# Patient Record
Sex: Female | Born: 1935 | Race: White | Hispanic: No | Marital: Married | Smoking: Former smoker
Health system: Southern US, Community
[De-identification: ages and names within clinical notes are randomized; demographics above are authoritative.]

## PROBLEM LIST (undated history)

## (undated) ENCOUNTER — Emergency Department (HOSPITAL_COMMUNITY): Disposition: A | Discharge status: Home / Self Care | Payer: Medicare Other

## (undated) DIAGNOSIS — I10 Essential (primary) hypertension: Secondary | ICD-10-CM

## (undated) DIAGNOSIS — K625 Hemorrhage of anus and rectum: Secondary | ICD-10-CM

## (undated) DIAGNOSIS — Z90710 Acquired absence of both cervix and uterus: Secondary | ICD-10-CM

## (undated) DIAGNOSIS — K219 Gastro-esophageal reflux disease without esophagitis: Secondary | ICD-10-CM

## (undated) DIAGNOSIS — I4891 Unspecified atrial fibrillation: Secondary | ICD-10-CM

## (undated) DIAGNOSIS — E0590 Thyrotoxicosis, unspecified without thyrotoxic crisis or storm: Secondary | ICD-10-CM

## (undated) DIAGNOSIS — I509 Heart failure, unspecified: Secondary | ICD-10-CM

## (undated) DIAGNOSIS — N159 Renal tubulo-interstitial disease, unspecified: Secondary | ICD-10-CM

## (undated) DIAGNOSIS — E785 Hyperlipidemia, unspecified: Secondary | ICD-10-CM

## (undated) DIAGNOSIS — F419 Anxiety disorder, unspecified: Secondary | ICD-10-CM

## (undated) DIAGNOSIS — Z90722 Acquired absence of ovaries, bilateral: Secondary | ICD-10-CM

## (undated) DIAGNOSIS — I499 Cardiac arrhythmia, unspecified: Secondary | ICD-10-CM

## (undated) DIAGNOSIS — T7840XA Allergy, unspecified, initial encounter: Secondary | ICD-10-CM

## (undated) DIAGNOSIS — Z9079 Acquired absence of other genital organ(s): Secondary | ICD-10-CM

## (undated) DIAGNOSIS — D649 Anemia, unspecified: Secondary | ICD-10-CM

## (undated) DIAGNOSIS — F0390 Unspecified dementia without behavioral disturbance: Secondary | ICD-10-CM

## (undated) HISTORY — DX: Cardiac arrhythmia, unspecified: I49.9

## (undated) HISTORY — PX: BILATERAL SALPINGOOPHORECTOMY: SHX1223

## (undated) HISTORY — DX: Acquired absence of both cervix and uterus: Z90.722

## (undated) HISTORY — DX: Acquired absence of both cervix and uterus: Z90.710

## (undated) HISTORY — DX: Hemorrhage of anus and rectum: K62.5

## (undated) HISTORY — PX: BLADDER SUSPENSION: SHX72

## (undated) HISTORY — DX: Anxiety disorder, unspecified: F41.9

## (undated) HISTORY — DX: Anemia, unspecified: D64.9

## (undated) HISTORY — DX: Thyrotoxicosis, unspecified without thyrotoxic crisis or storm: E05.90

## (undated) HISTORY — DX: Allergy, unspecified, initial encounter: T78.40XA

## (undated) HISTORY — PX: TONSILLECTOMY: SHX5217

## (undated) HISTORY — DX: Hyperlipidemia, unspecified: E78.5

## (undated) HISTORY — DX: Essential (primary) hypertension: I10

## (undated) HISTORY — DX: Unspecified atrial fibrillation: I48.91

## (undated) HISTORY — DX: Acquired absence of other genital organ(s): Z90.79

---

## 1984-06-26 HISTORY — PX: TOTAL ABDOMINAL HYSTERECTOMY W/ BILATERAL SALPINGOOPHORECTOMY: SHX83

## 2000-06-26 ENCOUNTER — Encounter (INDEPENDENT_AMBULATORY_CARE_PROVIDER_SITE_OTHER): Payer: Self-pay | Admitting: Specialist

## 2000-06-26 ENCOUNTER — Encounter: Payer: Self-pay | Admitting: Emergency Medicine

## 2000-06-27 ENCOUNTER — Inpatient Hospital Stay (HOSPITAL_COMMUNITY): Admission: EM | Admit: 2000-06-27 | Discharge: 2000-06-30 | Payer: Self-pay | Admitting: Emergency Medicine

## 2001-05-08 ENCOUNTER — Encounter: Admission: RE | Admit: 2001-05-08 | Discharge: 2001-05-08 | Payer: Self-pay | Admitting: Internal Medicine

## 2001-05-08 ENCOUNTER — Encounter: Payer: Self-pay | Admitting: Internal Medicine

## 2001-10-08 ENCOUNTER — Encounter: Payer: Self-pay | Admitting: Emergency Medicine

## 2001-10-08 ENCOUNTER — Inpatient Hospital Stay (HOSPITAL_COMMUNITY): Admission: EM | Admit: 2001-10-08 | Discharge: 2001-10-11 | Payer: Self-pay | Admitting: Emergency Medicine

## 2001-10-09 ENCOUNTER — Encounter: Payer: Self-pay | Admitting: Cardiovascular Disease

## 2006-05-13 ENCOUNTER — Encounter: Admission: RE | Admit: 2006-05-13 | Discharge: 2006-05-13 | Payer: Self-pay | Admitting: Orthopedic Surgery

## 2007-01-21 ENCOUNTER — Ambulatory Visit (HOSPITAL_COMMUNITY): Admission: RE | Admit: 2007-01-21 | Discharge: 2007-01-21 | Payer: Self-pay | Admitting: Internal Medicine

## 2007-01-29 ENCOUNTER — Encounter: Admission: RE | Admit: 2007-01-29 | Discharge: 2007-01-29 | Payer: Self-pay | Admitting: Internal Medicine

## 2008-03-04 ENCOUNTER — Encounter: Payer: Self-pay | Admitting: Cardiovascular Disease

## 2008-03-04 ENCOUNTER — Ambulatory Visit: Payer: Self-pay

## 2008-03-04 ENCOUNTER — Ambulatory Visit: Payer: Self-pay | Admitting: Cardiovascular Disease

## 2008-04-06 ENCOUNTER — Ambulatory Visit: Payer: Self-pay | Admitting: Cardiovascular Disease

## 2008-05-04 ENCOUNTER — Ambulatory Visit: Payer: Self-pay | Admitting: Cardiovascular Disease

## 2008-05-04 LAB — CONVERTED CEMR LAB
BUN: 26 mg/dL — ABNORMAL HIGH (ref 6–23)
Chloride: 110 meq/L (ref 96–112)
GFR calc non Af Amer: 35 mL/min
Glucose, Bld: 91 mg/dL (ref 70–99)
Potassium: 4.5 meq/L (ref 3.5–5.1)

## 2008-07-07 ENCOUNTER — Ambulatory Visit: Payer: Self-pay | Admitting: Cardiovascular Disease

## 2008-07-07 DIAGNOSIS — E785 Hyperlipidemia, unspecified: Secondary | ICD-10-CM

## 2008-07-07 DIAGNOSIS — I10 Essential (primary) hypertension: Secondary | ICD-10-CM | POA: Insufficient documentation

## 2008-07-07 DIAGNOSIS — Z9189 Other specified personal risk factors, not elsewhere classified: Secondary | ICD-10-CM | POA: Insufficient documentation

## 2008-07-07 DIAGNOSIS — I4891 Unspecified atrial fibrillation: Secondary | ICD-10-CM

## 2008-10-08 ENCOUNTER — Ambulatory Visit: Payer: Self-pay | Admitting: Cardiovascular Disease

## 2008-10-08 ENCOUNTER — Encounter: Payer: Self-pay | Admitting: Cardiovascular Disease

## 2008-10-08 DIAGNOSIS — F341 Dysthymic disorder: Secondary | ICD-10-CM | POA: Insufficient documentation

## 2008-10-08 DIAGNOSIS — H906 Mixed conductive and sensorineural hearing loss, bilateral: Secondary | ICD-10-CM | POA: Insufficient documentation

## 2009-03-04 ENCOUNTER — Encounter (INDEPENDENT_AMBULATORY_CARE_PROVIDER_SITE_OTHER): Payer: Self-pay | Admitting: *Deleted

## 2009-03-17 ENCOUNTER — Ambulatory Visit: Payer: Self-pay | Admitting: Diagnostic Radiology

## 2009-03-17 ENCOUNTER — Emergency Department (HOSPITAL_BASED_OUTPATIENT_CLINIC_OR_DEPARTMENT_OTHER): Admission: EM | Admit: 2009-03-17 | Discharge: 2009-03-17 | Payer: Self-pay | Admitting: Emergency Medicine

## 2009-08-25 ENCOUNTER — Ambulatory Visit: Payer: Self-pay | Admitting: Cardiovascular Disease

## 2009-08-25 DIAGNOSIS — R0989 Other specified symptoms and signs involving the circulatory and respiratory systems: Secondary | ICD-10-CM

## 2009-08-25 DIAGNOSIS — Z9119 Patient's noncompliance with other medical treatment and regimen: Secondary | ICD-10-CM

## 2009-09-15 ENCOUNTER — Encounter: Payer: Self-pay | Admitting: Cardiovascular Disease

## 2009-09-15 ENCOUNTER — Ambulatory Visit: Payer: Self-pay

## 2009-09-21 ENCOUNTER — Ambulatory Visit: Payer: Self-pay | Admitting: Cardiovascular Disease

## 2009-11-17 ENCOUNTER — Ambulatory Visit: Payer: Self-pay | Admitting: Cardiovascular Disease

## 2010-05-24 ENCOUNTER — Ambulatory Visit: Payer: Self-pay | Admitting: Cardiovascular Disease

## 2010-06-23 ENCOUNTER — Telehealth: Payer: Self-pay | Admitting: Cardiovascular Disease

## 2010-07-26 NOTE — Assessment & Plan Note (Signed)
Summary: F2M/DM   Primary Provider:  Dr. Elvera Lennox  CC:  follow up pt is no longer taken losartan.  History of Present Illness: Leah Harper is seen today for F/U of elevated lipids and HTN.  Unfortunately she continues to be depressed.  She needs to F/U with Dr Marveen Reeks for this and weight loss.  She took her Cozaar for a month and then stopped it.  I am not sure why as there were no side effects.  She has not had any SSCP, palpitations, PND, orthopnea or edema.  She has occasoinal dizzyness.  Current Problems (verified): 1)  Pers Hx Noncompliance W/med Tx Prs Hazards Hl[facility]  (ICD-V15.81) 2)  Carotid Bruit  (ICD-785.9) 3)  Anxiety Depression  (ICD-300.4) 4)  Mixed Hearing Loss Bilateral  (ICD-389.22) 5)  Chest Pain, Atypical, Hx of  (ICD-V15.89) 6)  Hyperlipidemia-mixed  (ICD-272.4) 7)  Hypertension  (ICD-401.9) 8)  Atrial Fibrillation  (ICD-427.31)  Current Medications (verified): 1)  Metoprolol Tartrate 50 Mg Tabs (Metoprolol Tartrate) .... 1/2 Tab By Mouth in The Morning 2)  Zetia 10 Mg Tabs (Ezetimibe) .... Take One Tablet By Mouth Daily. 3)  Aspirin 81 Mg Tbec (Aspirin) .... Take One Tablet By Mouth Daily 4)  Losartan Potassium 50 Mg Tabs (Losartan Potassium) .... One Daily  Allergies (verified): No Known Drug Allergies  Past History:  Past Medical History: Last updated: 07/07/2008  HYPERLIPIDEMIA-MIXED (ICD-272.4) HYPERTENSION (ICD-401.9) ATRIAL FIBRILLATION (ICD-427.31) Hyperthyoidism remotely in 1941 Normocytic Anemia 2002 TAHBSO with bladder tack  Past Surgical History: Last updated: 07/07/2008 Tonsillectomy Abdominal Hysterectomy  Family History: Last updated: 07/07/2008 Noncontributory  Social History: Last updated: 07/07/2008 Retired  Married- 3 children Tobacco Use - No.  Alcohol Use - no Regular Exercise - no  Review of Systems       Denies fever, malais, weight loss, blurry vision, decreased visual acuity, cough, sputum, SOB, hemoptysis, pleuritic pain,  palpitaitons, heartburn, abdominal pain, melena, lower extremity edema, claudication, or rash.   Vital Signs:  Patient profile:   77 year old female Height:      64 inches Weight:      105 pounds BMI:     18.09 Pulse rate:   60 / minute Resp:     18 per minute BP sitting:   180 / 69  (left arm)  Vitals Entered By: Kem Parkinson (Nov 17, 2009 9:30 AM)  Physical Exam  General:  Affect appropriate Healthy:  appears stated age HEENT: normal Neck supple with no adenopathy JVP normal no bruits no thyromegaly Lungs clear with no wheezing and good diaphragmatic motion Heart:  S1/S2 no murmur,rub, gallop or click PMI normal Abdomen: benighn, BS positve, no tenderness, no AAA no bruit.  No HSM or HJR Distal pulses intact with no bruits No edema Neuro non-focal Skin warm and dry    Impression & Recommendations:  Problem # 1:  ANXIETY DEPRESSION (ICD-300.4) F/U Dr Darcus Austin.  Major issue with her.  Should be on medication and consider counseling.  Weight loss likely related to this but should have physical and general lab work  Problem # 2:  HYPERLIPIDEMIA-MIXED (ICD-272.4) Intolerant to statins.  Lab work per primary Her updated medication list for this problem includes:    Zetia 10 Mg Tabs (Ezetimibe) .Marland Kitchen... Take one tablet by mouth daily.  Problem # 3:  HYPERTENSION (ICD-401.9) Restart Cozaar.  May need to titrate dose The following medications were removed from the medication list:    Losartan Potassium 50 Mg Tabs (Losartan potassium) ..... One tablet by  mouth once daily Her updated medication list for this problem includes:    Metoprolol Tartrate 50 Mg Tabs (Metoprolol tartrate) .Marland Kitchen... 1/2 tab by mouth in the morning    Aspirin 81 Mg Tbec (Aspirin) .Marland Kitchen... Take one tablet by mouth daily    Losartan Potassium 50 Mg Tabs (Losartan potassium) ..... One daily  Patient Instructions: 1)  Your physician recommends that you schedule a follow-up appointment in: 6 months with Dr  Eden Emms 2)  Your physician has recommended you make the following change in your medication: restart Losartan 50 mg a day Prescriptions: LOSARTAN POTASSIUM 50 MG TABS (LOSARTAN POTASSIUM) one daily  #30 x 11   Entered by:   Charolotte Capuchin, RN   Authorized by:   Colon Branch, MD, [facility]   Signed by:   Charolotte Capuchin, RN on 11/17/2009   Method used:   [facility] to        [facility] [phone]* (retail)       [address]       [place], [place]  [address]       Ph: [phone]       Fax: [phone]   RxID:   [id number]

## 2010-07-26 NOTE — Assessment & Plan Note (Signed)
Summary: rov./ gd   Visit Type:  Follow-up Primary Provider:  Dr. Elvera Harper  CC:  No cardiac complains.  History of Present Illness: [name] an 77 year old white female patient, who presents today with elevated blood pressures. The patient has a history of noncompliance and is somewhat confused at times. Her husband states that her blood pressures been going up and down over the past several months. She recently had a urinary tract infection and went through 2 different antibiotic treatments. She did not finish her last one because she read the label and didn't like what it said. Her blood pressures have been up to the 190 systolic and a low of 139 systolic. She does eat a fair amount of sodium and they frequent fast food places for lunch. They're favored as [employer] I reviewed her BP records and think she would benefit from an ARB.  She continues to be depressed with some unexplained weigth loss.  She will see Dr Leah Harper for this.  Current Problems (verified): 1)  Pers Hx Noncompliance W/med Tx Prs Hazards Hlth  (ICD-V15.81) 2)  Carotid Bruit  (ICD-785.9) 3)  Anxiety Depression  (ICD-300.4) 4)  Mixed Hearing Loss Bilateral  (ICD-389.22) 5)  Chest Pain, Atypical, Hx of  (ICD-V15.89) 6)  Hyperlipidemia-mixed  (ICD-272.4) 7)  Hypertension  (ICD-401.9) 8)  Atrial Fibrillation  (ICD-427.31)  Current Medications (verified): 1)  Metoprolol Tartrate 50 Mg Tabs (Metoprolol Tartrate) .... 1/2 Tab By Mouth in The Morning 2)  Zetia 10 Mg Tabs (Ezetimibe) .... Take One Tablet By Mouth Daily. 3)  Aspirin 81 Mg Tbec (Aspirin) .... Take One Tablet By Mouth Daily 4)  Losartan Potassium 50 Mg Tabs (Losartan Potassium) .... One Tablet By Mouth Once Daily  Allergies (verified): No Known Drug Allergies  Past History:  Past Medical History: Last updated: 07/07/2008  HYPERLIPIDEMIA-MIXED (ICD-272.4) HYPERTENSION (ICD-401.9) ATRIAL FIBRILLATION (ICD-427.31) Hyperthyoidism remotely in 1941 Normocytic Anemia  2002 TAHBSO with bladder tack  Past Surgical History: Last updated: 07/07/2008 Tonsillectomy Abdominal Hysterectomy  Family History: Last updated: 07/07/2008 Noncontributory  Social History: Last updated: 07/07/2008 Retired  Married- 3 children Tobacco Use - No.  Alcohol Use - no Regular Exercise - no  Review of Systems       Denies fever, malais, weight loss, blurry vision, decreased visual acuity, cough, sputum, SOB, hemoptysis, pleuritic painheartburn, abdominal pain, melena, lower extremity edema, claudication, or rash. Needs a podiatrist does not like [facility].    Vital Signs:  Patient profile:   77 year old female Height:      64 inches Weight:      109.25 pounds Pulse rate:   62 / minute Pulse rhythm:   regular Resp:     18 per minute BP sitting:   163 / 73  (left arm) Cuff size:   regular  Vitals Entered By: Leah Harper (September 21, 2009 3:57 PM)  Physical Exam  General:  Affect appropriate Healthy:  appears stated age HEENT: normal Neck supple with no adenopathy JVP normal no bruits no thyromegaly Lungs clear with no wheezing and good diaphragmatic motion Heart:  S1/S2 no murmur,rub, gallop or click PMI normal Abdomen: benighn, BS positve, no tenderness, no AAA no bruit.  No HSM or HJR Distal pulses intact with no bruits No edema Neuro non-focal Skin warm and dry    Impression & Recommendations:  Problem # 1:  CAROTID BRUIT (ICD-785.9) Transmitted murmur no significant lesion by carotid duplex.  Continue ASA  Problem # 2:  ANXIETY DEPRESSION (ICD-300.4) F/U [name].  Would benefit from Cymbalta or other such med  Problem # 3:  HYPERLIPIDEMIA-MIXED (ICD-272.4) F/U labs Dr Leah Harper Her updated medication list for this problem includes:    Zetia 10 Mg Tabs (Ezetimibe) .Marland Kitchen... Take one tablet by mouth daily.  Problem # 4:  HYPERTENSION (ICD-401.9) Add Cozaar 50mg  F/U 8 weeks and check BMET Her updated medication list for this problem includes:     Metoprolol Tartrate 50 Mg Tabs (Metoprolol tartrate) .Marland Kitchen... 1/2 tab by mouth in the morning    Aspirin 81 Mg Tbec (Aspirin) .Marland Kitchen... Take one tablet by mouth daily    Losartan Potassium 50 Mg Tabs (Losartan potassium) ..... One tablet by mouth once daily  Problem # 5:  ATRIAL FIBRILLATION (ICD-427.31) PAF with palpitations.  In NSR with LBBB.  No indication for coumadin  Continue to follow Her updated medication list for this problem includes:    Metoprolol Tartrate 50 Mg Tabs (Metoprolol tartrate) .Marland Kitchen... 1/2 tab by mouth in the morning    Aspirin 81 Mg Tbec (Aspirin) .Marland Kitchen... Take one tablet by mouth daily  Patient Instructions: 1)  Your physician recommends that you schedule a follow-up appointment in: 8 weeks 2)  Your physician has recommended you make the following change in your medication: START LOSARTAN 50MG  ONCE DAILY Prescriptions: LOSARTAN POTASSIUM 50 MG TABS (LOSARTAN POTASSIUM) one tablet by mouth once daily  #30 x 12   Entered by:   Leah Goody, RN   Authorized by:   Leah Branch, MD, [facility]   Signed by:   Leah Goody, RN on 09/21/2009   Method used:   Electronically to        [facility] [phone]* (retail)       [address]       [place], [place]  [address]       Ph: [phone]       Fax: [phone]   RxID:   [id number]    EKG Report  Procedure date:  09/21/2009  Findings:      NSR L BBB Rate 62

## 2010-07-26 NOTE — Assessment & Plan Note (Signed)
Summary: rov per spouse call/pt heart rate goes up and down and they a...   Primary Provider:  Dr. Elvera Lennox  CC:  Bood pressuse.  History of Present Illness: [name] an 77 year old white female patient, who presents today with elevated blood pressures. The patient has a history of noncompliance and is somewhat confused at times. Her husband states that her blood pressures been going up and down over the past several months. She recently had a urinary tract infection and went through 2 different antibiotic treatments. She did not finish her last one because she read the label and didn't like what it said. Her blood pressures have been up to the 190 systolic and a low of 139 systolic. She does eat a fair amount of sodium and they frequent fast food places for lunch. They're favored as [employer]. Her husband states that she's good about taking her 3 medications. She is currently on but will most likely not take any additional medication.  Problems Prior to Update: 1)  Carotid Bruit  (ICD-785.9) 2)  Anxiety Depression  (ICD-300.4) 3)  Mixed Hearing Loss Bilateral  (ICD-389.22) 4)  Chest Pain, Atypical, Hx of  (ICD-V15.89) 5)  Hyperlipidemia-mixed  (ICD-272.4) 6)  Hypertension  (ICD-401.9) 7)  Atrial Fibrillation  (ICD-427.31)  Current Medications (verified): 1)  Metoprolol Tartrate 50 Mg Tabs (Metoprolol Tartrate) .... 1/2 Tab By Mouth Once Daily 2)  Zetia 10 Mg Tabs (Ezetimibe) .... Take One Tablet By Mouth Daily. 3)  Aspirin 81 Mg Tbec (Aspirin) .... Take One Tablet By Mouth Daily  Allergies: No Known Drug Allergies  Past History:  Past Medical History: Last updated: 07/07/2008  HYPERLIPIDEMIA-MIXED (ICD-272.4) HYPERTENSION (ICD-401.9) ATRIAL FIBRILLATION (ICD-427.31) Hyperthyoidism remotely in 1941 Normocytic Anemia 2002 TAHBSO with bladder tack  Review of Systems       see history of present illness. She denies chest pain, palpitations, dyspnea, dyspnea on exertion, dizziness, or  presyncope  Vital Signs:  Patient profile:   77 year old female Height:      64 inches Weight:      109 pounds Pulse rate:   56 / minute BP sitting:   139 / 59  (left arm) Cuff size:   regular  Vitals Entered By: Oswald Hillock (August 25, 2009 8:31 AM)  Physical Exam  General:   Well-nournished, in no acute distress. Neck: Left carotid bruit, No JVD, HJR, or thyroid enlargement Lungs: No tachypnea, clear without wheezing, rales, or rhonchi Cardiovascular: RRR, PMI not displaced, heart sounds normal, no murmurs, gallops, bruit, thrill, or heave. Abdomen: BS normal. Soft without organomegaly, masses, lesions or tenderness. Extremities: without cyanosis, clubbing or edema. Good distal pulses bilateral SKin: Warm, no lesions or rashes  Musculoskeletal: No deformities Neuro: no focal signs    Impression & Recommendations:  Problem # 1:  HYPERTENSION (ICD-401.9) Patient's blood pressure then elevated over the last several months. We will increase her metoprolol to 50 mg one half b.i.d. Her husband states she will not take a new medication. I've also given them a 2 g sodium diet. I explained in detail the importance of this and asked them to stop eating fast foods. Her updated medication list for this problem includes:    Metoprolol Tartrate 50 Mg Tabs (Metoprolol tartrate) .Marland Kitchen... 1/2 tab by mouth twice a  day    Aspirin 81 Mg Tbec (Aspirin) .Marland Kitchen... Take one tablet by mouth daily  Problem # 2:  CAROTID BRUIT (ICD-785.9) Patient has a new left carotid bruit. We will order carotid Dopplers.  Orders: Carotid Duplex (Carotid Duplex)  Problem # 3:  PERS HX NONCOMPLIANCE W/MED TX PRS HAZARDS HLTH (ICD-V15.81) patient has a long history of noncompliance. This will limit our use of treating her blood pressure. Hopefully, the bump, and metoprolol and 2 g sodium diet will take care of this.  Patient Instructions: 1)  Your physician recommends that you schedule a follow-up appointment in:  Pt. has an appointment with Dr. Eden Emms March 29 th at 4:15 PM 2)  Your physician has recommended you make the following change in your medication: Metoprolol 50 md take 1/2 tB BY MOUTH TWICE A DAY 3)  Your physician has requested that you have a carotid duplex. This test is an ultrasound of the carotid arteries in your neck. It looks at blood flow through these arteries that supply the brain with blood. Allow one hour for this exam. There are no restrictions or special instructions. 4)  Your physician has requested that you limit the intake of sodium (salt) in your diet to two grams daily. Please see [facility] handout. Prescriptions: METOPROLOL TARTRATE 50 MG TABS (METOPROLOL TARTRATE) 1/2 tab by mouth twice a  day  #30 x 8   Entered by:   Ollen Gross, RN, BSN   Authorized by:   Marletta Lor, PA-C   Signed by:   Ollen Gross, RN, BSN on 08/25/2009   Method used:   Electronically to        [facility] [phone]* (retail)       [address]       [place], [place]  [address]       Ph: [phone]       Fax: [phone]   RxID:   [id number]

## 2010-07-26 NOTE — Assessment & Plan Note (Signed)
Summary: 6 months HTN  pfh,rn   Primary Provider:  Dr. Elvera Lennox  CC:  dizziness.  History of Present Illness: Leah Harper is seen today for F/U of elevated lipids and HTN.  Unfortunately she continues to be depressed.  She needs to F/U with Dr Marveen Reeks for this and weight loss.  She took her Cozaar for a month and then stopped it.  I am not sure why as there were no side effects.  She has not had any SSCP, palpitations, PND, orthopnea or edema.  She has occasoinal dizzyness.  Discussed with her at length about need to take Cozaar for BP control.  Will call in 25mg  to [facility]     Current Problems (verified): 1)  Pers Hx Noncompliance W/med Tx Prs Hazards Hlth  (ICD-V15.81) 2)  Carotid Bruit  (ICD-785.9) 3)  Anxiety Depression  (ICD-300.4) 4)  Mixed Hearing Loss Bilateral  (ICD-389.22) 5)  Chest Pain, Atypical, Hx of  (ICD-V15.89) 6)  Hyperlipidemia-mixed  (ICD-272.4) 7)  Hypertension  (ICD-401.9) 8)  Atrial Fibrillation  (ICD-427.31)  Current Medications (verified): 1)  Metoprolol Tartrate 50 Mg Tabs (Metoprolol Tartrate) .... 1/2 Tab By Mouth in The Morning 2)  Zetia 10 Mg Tabs (Ezetimibe) .... Take One Tablet By Mouth Daily. 3)  Aspirin 81 Mg Tbec (Aspirin) .... Take One Tablet By Mouth Daily 4)  Losartan Potassium 25 Mg Tabs (Losartan Potassium) .... One Tablet By Mouth Once Daily  Allergies (verified): No Known Drug Allergies  Past History:  Past Medical History: Last updated: 07/07/2008  HYPERLIPIDEMIA-MIXED (ICD-272.4) HYPERTENSION (ICD-401.9) ATRIAL FIBRILLATION (ICD-427.31) Hyperthyoidism remotely in 1941 Normocytic Anemia 2002 TAHBSO with bladder tack  Past Surgical History: Last updated: 07/07/2008 Tonsillectomy Abdominal Hysterectomy  Family History: Last updated: 07/07/2008 Noncontributory  Social History: Last updated: 07/07/2008 Retired  Married- 3 children Tobacco Use - No.  Alcohol Use - no Regular Exercise - no  Review of Systems    Denies fever, malais, weight loss, blurry vision, decreased visual acuity, cough, sputum, SOB, hemoptysis, pleuritic pain, palpitaitons, heartburn, abdominal pain, melena, lower extremity edema, claudication, or rash.   Vital Signs:  Patient profile:   77 year old female Height:      64 inches Weight:      101 pounds BMI:     17.40 Pulse rate:   61 / minute Resp:     18 per minute BP sitting:   176 / 63  (right arm)  Vitals Entered By: Kem Parkinson (May 24, 2010 10:40 AM)  Physical Exam  General:  Affect appropriate Healthy:  appears stated age HEENT: normal Neck supple with no adenopathy JVP normal no bruits no thyromegaly Lungs clear with no wheezing and good diaphragmatic motion Heart:  S1/S2 no murmur,rub, gallop or click PMI normal Abdomen: benighn, BS positve, no tenderness, no AAA no bruit.  No HSM or HJR Distal pulses intact with no bruits No edema Neuro non-focal Skin warm and dry    Impression & Recommendations:  Problem # 1:  HYPERTENSION (ICD-401.9) Start and be compliant with ARB  F/U in 2-3 months The following medications were removed from the medication list:    Losartan Potassium 50 Mg Tabs (Losartan potassium) ..... One daily Her updated medication list for this problem includes:    Metoprolol Tartrate 50 Mg Tabs (Metoprolol tartrate) .Marland Kitchen... 1/2 tab by mouth in the morning    Aspirin 81 Mg Tbec (Aspirin) .Marland Kitchen... Take one tablet by mouth daily    Losartan Potassium 25 Mg Tabs (Losartan potassium) .Marland KitchenMarland KitchenMarland KitchenMarland Kitchen  One tablet by mouth once daily  Problem # 2:  HYPERLIPIDEMIA-MIXED (ICD-272.4) f/U labs per Dr Waynard Edwards Her updated medication list for this problem includes:    Zetia 10 Mg Tabs (Ezetimibe) .Marland Kitchen... Take one tablet by mouth daily.  Problem # 3:  ATRIAL FIBRILLATION (ICD-427.31) In NSR  Stressed importance of BP control to prevent recurrence.  Continue ASA and BB Her updated medication list for this problem includes:    Metoprolol Tartrate 50 Mg  Tabs (Metoprolol tartrate) .Marland Kitchen... 1/2 tab by mouth in the morning    Aspirin 81 Mg Tbec (Aspirin) .Marland Kitchen... Take one tablet by mouth daily  Patient Instructions: 1)  Your physician recommends that you schedule a follow-up appointment in: 3 MONTHS 2)  Your physician has recommended you make the following change in your medication: START LOSARTAN 25MG  ONCE DAILY Prescriptions: LOSARTAN POTASSIUM 25 MG TABS (LOSARTAN POTASSIUM) one tablet by mouth once daily  #30 x 12   Entered by:   Deliah Goody, RN   Authorized by:   Colon Branch, MD, [facility]   Signed by:   Deliah Goody, RN on 05/24/2010   Method used:   Electronically to        [facility] [phone]* (retail)       [address]       [place], [place]  [address]       Ph: [phone]       Fax: [phone]   RxID:   [id number]

## 2010-07-28 NOTE — Progress Notes (Signed)
Summary: rx refill  Phone Note Refill Request Message from:  Patient on June 23, 2010 11:36 AM  Refills Requested: Medication #1:  LOSARTAN POTASSIUM 25 MG TABS one tablet by mouth once daily. [facility] [phone] 90 days supply  pt id # [id number]   Method Requested: Telephone to Pharmacy Initial call taken by: Roe Coombs,  June 23, 2010 11:36 AM    Prescriptions: LOSARTAN POTASSIUM 25 MG TABS (LOSARTAN POTASSIUM) one tablet by mouth once daily  #90 x 3   Entered by:   Kem Parkinson   Authorized by:   Colon , MD, [facility]   Signed by:   Kem Parkinson on 06/23/2010   Method used:   Faxed to ...       [facility] (mail[facility]order)       [address] [place], [place]  [address]       Ph: [phone]       Fax: [phone]   RxID:   [id number]

## 2010-08-02 ENCOUNTER — Encounter: Payer: Self-pay | Admitting: Cardiovascular Disease

## 2010-08-02 ENCOUNTER — Ambulatory Visit (INDEPENDENT_AMBULATORY_CARE_PROVIDER_SITE_OTHER): Payer: Medicare Other | Admitting: Cardiovascular Disease

## 2010-08-02 DIAGNOSIS — E785 Hyperlipidemia, unspecified: Secondary | ICD-10-CM

## 2010-08-02 DIAGNOSIS — I10 Essential (primary) hypertension: Secondary | ICD-10-CM

## 2010-08-11 NOTE — Assessment & Plan Note (Signed)
Summary: F3M RS PER BUMPLIST/PT=MJ   Primary Provider:  Dr. Elvera Lennox  CC:  check up.  History of Present Illness: Leah Harper is seen today for F/U of elevated lipids and HTN. Her depression seems improved.  She needs to F/U with Dr Marveen Reeks for this and weight loss. She has been more compliant with her Cozaar I.  She has not had any SSCP, palpitations, PND, orthopnea or edema.  She has occasoinal dizzyness.  She indicates she would like her daughter Leah Harper to have access to her medical information and for [place] to call her with results of tests and such.  Current Problems (verified): 1)  Pers Hx Noncompliance W/med Tx [facility]s [facility]  (ICD-V15.81) 2)  Carotid Bruit  (ICD-785.9) 3)  Anxiety Depression  (ICD-300.4) 4)  Mixed Hearing Loss Bilateral  (ICD-389.22) 5)  Chest Pain, Atypical, Hx of  (ICD-V15.89) 6)  Hyperlipidemia-mixed  (ICD-272.4) 7)  Hypertension  (ICD-401.9) 8)  Atrial Fibrillation  (ICD-427.31)  Current Medications (verified): 1)  Metoprolol Tartrate 50 Mg Tabs (Metoprolol Tartrate) .... 1/2 Tab By Mouth in The Morning 2)  Zetia 10 Mg Tabs (Ezetimibe) .... Take One Tablet By Mouth Daily. 3)  Aspirin 81 Mg Tbec (Aspirin) .... Take One Tablet By Mouth Daily 4)  Losartan Potassium 25 Mg Tabs (Losartan Potassium) .... One Tablet By Mouth Once Daily  Allergies (verified): No Known Drug Allergies  Past History:  Past Medical History: Last updated: 07/07/2008  HYPERLIPIDEMIA-MIXED (ICD-272.4) HYPERTENSION (ICD-401.9) ATRIAL FIBRILLATION (ICD-427.31) Hyperthyoidism remotely in 1941 Normocytic Anemia 2002 TAHBSO with bladder tack  Past Surgical History: Last updated: 07/07/2008 Tonsillectomy Abdominal Hysterectomy  Family History: Last updated: 07/07/2008 Noncontributory  Social History: Last updated: 07/07/2008 Retired  Married- 3 children Tobacco Use - No.  Alcohol Use - no Regular Exercise - no  Review of Systems       Denies fever, malais, weight loss,  blurry vision, decreased visual acuity, cough, sputum, SOB, hemoptysis, pleuritic pain, palpitaitons, heartburn, abdominal pain, melena, lower extremity edema, claudication, or rash.   Vital Signs:  Patient profile:   77 year old female Height:      64 inches Weight:      102 pounds BMI:     17.57 Pulse rate:   62 / minute Resp:     16 per minute BP sitting:   130 / 58  (left arm)  Vitals Entered By: Kem Parkinson (August 02, 2010 3:01 PM)  Physical Exam  General:  Affect appropriate Healthy:  appears stated age HEENT: normal Neck supple with no adenopathy JVP normal no bruits no thyromegaly Lungs clear with no wheezing and good diaphragmatic motion Heart:  S1/S2 no murmur,rub, gallop or click PMI normal Abdomen: benighn, BS positve, no tenderness, no AAA no bruit.  No HSM or HJR Distal pulses intact with no bruits No edema Neuro non-focal Skin warm and dry    Impression & Recommendations:  Problem # 1:  HYPERLIPIDEMIA-MIXED (ICD-272.4) Continue Zetia Labs with Perrrini Thursday Her updated medication list for this problem includes:    Zetia 10 Mg Tabs (Ezetimibe) .Marland Kitchen... Take one tablet by mouth daily.  Problem # 2:  HYPERTENSION (ICD-401.9) Well controlled with ARB compliance Her updated medication list for this problem includes:    Metoprolol Tartrate 50 Mg Tabs (Metoprolol tartrate) .Marland Kitchen... 1/2 tab by mouth in the morning    Aspirin 81 Mg Tbec (Aspirin) .Marland Kitchen... Take one tablet by mouth daily    Losartan Potassium 25 Mg Tabs (Losartan potassium) ..... One tablet  by mouth once daily  Problem # 3:  ATYPICAL DEPRESSIVE DISORDER (ICD-296.82) F/U Perrini to consdier SSRI  Patient Instructions: 1)  Your physician wants you to follow-up in:ONE YEAR   You will receive a reminder letter in the mail two months in advance. If you don't receive a letter, please call our office to schedule the follow-up appointment.

## 2010-10-01 ENCOUNTER — Emergency Department (HOSPITAL_BASED_OUTPATIENT_CLINIC_OR_DEPARTMENT_OTHER)
Admission: EM | Admit: 2010-10-01 | Discharge: 2010-10-01 | Disposition: A | Discharge status: Home / Self Care | Payer: Medicare Other | Attending: Emergency Medicine | Admitting: Emergency Medicine

## 2010-10-01 DIAGNOSIS — N289 Disorder of kidney and ureter, unspecified: Secondary | ICD-10-CM | POA: Insufficient documentation

## 2010-10-01 DIAGNOSIS — E860 Dehydration: Secondary | ICD-10-CM | POA: Insufficient documentation

## 2010-10-01 DIAGNOSIS — I10 Essential (primary) hypertension: Secondary | ICD-10-CM | POA: Insufficient documentation

## 2010-10-01 DIAGNOSIS — R109 Unspecified abdominal pain: Secondary | ICD-10-CM | POA: Insufficient documentation

## 2010-10-01 LAB — URINALYSIS, ROUTINE W REFLEX MICROSCOPIC
Bilirubin Urine: NEGATIVE
Ketones, ur: 15 mg/dL — AB
Nitrite: NEGATIVE
Protein, ur: NEGATIVE mg/dL

## 2010-10-01 LAB — COMPREHENSIVE METABOLIC PANEL
ALT: 24 U/L (ref 0–35)
Alkaline Phosphatase: 109 U/L (ref 39–117)
BUN: 38 mg/dL — ABNORMAL HIGH (ref 6–23)
CO2: 24 mEq/L (ref 19–32)
Chloride: 103 mEq/L (ref 96–112)
GFR calc non Af Amer: 21 mL/min — ABNORMAL LOW (ref >60)
Glucose, Bld: 97 mg/dL (ref 70–99)
Potassium: 5 mEq/L (ref 3.5–5.1)
Total Bilirubin: 0.8 mg/dL (ref 0.3–1.2)

## 2010-10-01 LAB — DIFFERENTIAL
Eosinophils Relative: 2 % (ref 0–5)
Lymphocytes Relative: 29 % (ref 12–46)
Lymphs Abs: 2 10*3/uL (ref 0.7–4.0)
Neutro Abs: 4.3 10*3/uL (ref 1.7–7.7)

## 2010-10-01 LAB — CBC
HCT: 35.8 % — ABNORMAL LOW (ref 36.0–46.0)
Hemoglobin: 12.1 g/dL (ref 12.0–15.0)
MCV: 90.6 fL (ref 78.0–100.0)
RBC: 3.95 MIL/uL (ref 3.87–5.11)
RDW: 12.6 % (ref 11.5–15.5)
WBC: 6.9 10*3/uL (ref 4.0–10.5)

## 2010-10-01 LAB — LIPASE, BLOOD: Lipase: 272 U/L (ref 23–300)

## 2010-10-02 LAB — URINE CULTURE
Colony Count: 25000
Culture  Setup Time: 201204080019

## 2010-10-04 ENCOUNTER — Encounter: Payer: Self-pay | Admitting: Family

## 2010-10-05 ENCOUNTER — Ambulatory Visit (INDEPENDENT_AMBULATORY_CARE_PROVIDER_SITE_OTHER): Payer: Medicare Other | Admitting: Family

## 2010-10-05 ENCOUNTER — Encounter: Payer: Self-pay | Admitting: Family

## 2010-10-05 ENCOUNTER — Telehealth: Payer: Self-pay | Admitting: Family

## 2010-10-05 DIAGNOSIS — R634 Abnormal weight loss: Secondary | ICD-10-CM

## 2010-10-05 DIAGNOSIS — N289 Disorder of kidney and ureter, unspecified: Secondary | ICD-10-CM

## 2010-10-05 DIAGNOSIS — R413 Other amnesia: Secondary | ICD-10-CM

## 2010-10-05 DIAGNOSIS — R630 Anorexia: Secondary | ICD-10-CM | POA: Insufficient documentation

## 2010-10-05 LAB — TSH: TSH: 2.125 u[IU]/mL (ref 0.350–4.500)

## 2010-10-05 MED ORDER — MEGESTROL ACETATE 40 MG/ML PO SUSP: 400.0000 mg | Freq: Every day | ORAL | Status: DC

## 2010-10-05 MED ORDER — ALPRAZOLAM 0.5 MG PO TABS: ORAL_TABLET | ORAL | Status: DC

## 2010-10-05 MED ORDER — MOMETASONE FUROATE 50 MCG/ACT NA SUSP: 2.0000 | Freq: Every day | NASAL | Status: DC

## 2010-10-05 NOTE — Progress Notes (Signed)
Subjective:    Patient ID: Leah Harper, female    DOB: [birth year], 77 y.o.   MRN: [id number]  HPI  Leah Harper is an 77 year old female who presents to establish care today.  Her husband is already an established patient at our practice.  She has been followed by Dr. Waynard Edwards at [facility].  She was seen on 10/01/10 in the [facility] ED where she was diagnosed with Dehydration and acute on chronic renal insufficiency.  ED records have been reviewed.  HTN-has been on meds x 10 years.    Hyperlipidemia- treated with zetia.    UTI's- rarely per pt.  Wears a pessary.  Fitted by a Physician at [facility] hospital.  Verita Schneiders)  Dementia- She feels that her sister died due to Aricept.    Weight loss- 124 pounds down to 98 pounds in the last 1 year.  Husband notes that her desire to eat and fix food has gone down.    Depression-   Husband concerned that her mood is not as good as it has been.  Wants to live near her children/grandchildren in [place].   Constipation-Recently has worsened, not eating as she should.  Dehydration- drinks 4 glasses of water or tea in a day.  Bunions- requesting referral to podiatry. Review of Systems  Constitutional: Positive for unexpected weight change. Negative for fever.  HENT: Positive for rhinorrhea. Negative for hearing loss.   Eyes: Negative for visual disturbance.  Respiratory: Negative for cough and shortness of breath.   Cardiovascular: Negative for chest pain and leg swelling.  Gastrointestinal: Positive for nausea and constipation. Negative for vomiting.  Genitourinary: Negative for dysuria and frequency.  Musculoskeletal: Negative for back pain and arthralgias.  Neurological: Positive for weakness. Negative for headaches.  Hematological: Negative for adenopathy.  Psychiatric/Behavioral:       See HPI   Past Medical History  Diagnosis Date  . Hyperlipidemia   . Hypertension   . AF (atrial fibrillation)   .  Hyperthyroidism     remotely in 1941  . Normocytic anemia     2002  . S/P TAH-BSO (total abdominal hysterectomy and bilateral salpingo-oophorectomy)   . Allergy   . Arrhythmia   . Anxiety     History   Social History  . Marital Status: Married    Spouse Name: N/A    Number of Children: 3  . Years of Education: N/A   Occupational History  . Not on file.   Social History Main Topics  . Smoking status: Never Smoker   . Smokeless tobacco: Never Used  . Alcohol Use: No  . Drug Use: Not on file  . Sexually Active: Not on file   Other Topics Concern  . Not on file   Social History Narrative   RetiredMarried -3 children Tobacco abuse-noAlcohol use-no  Regular exercise-no    Past Surgical History  Procedure Date  . Tonsillectomy   . Total abdominal hysterectomy w/ bilateral salpingoophorectomy 1986    Family History  Problem Relation Age of Onset  . Heart disease Father   . Heart disease Brother     heart attack  . Heart disease Brother     No Known Allergies  Current Outpatient Prescriptions on File Prior to Visit  Medication Sig Dispense Refill  . aspirin 81 MG tablet Take 81 mg by mouth daily.        Marland Kitchen ezetimibe (ZETIA) 10 MG tablet Take 10 mg by mouth daily.        Marland Kitchen  losartan (COZAAR) 25 MG tablet Take 25 mg by mouth daily.        . metoprolol (LOPRESSOR) 50 MG tablet Take 25 mg by mouth. 1/2 tablet by mouth in the morning         BP 122/69  Pulse 96  Temp(Src) 97.9 F (36.6 C) (Axillary)  Resp 24  Ht 5' 4.02" (1.626 m)  Wt 98 lb (44.453 kg)  BMI 16.81 kg/m2  SpO2 99%       Objective:   Physical Exam  Constitutional: She appears well-developed and well-nourished.  HENT:  Head: Normocephalic and atraumatic.  Neck: Normal range of motion. Neck supple.  Cardiovascular: Normal rate and regular rhythm.   Pulmonary/Chest: Effort normal and breath sounds normal.  Abdominal: Soft. She exhibits no distension and no mass. There is no tenderness. There  is no guarding.  Musculoskeletal: She exhibits no edema.  Neurological:       Hard of hearing.  Memory appears to be impaired.          Assessment & Plan:

## 2010-10-05 NOTE — Assessment & Plan Note (Signed)
Will refer to Nephrology

## 2010-10-05 NOTE — Assessment & Plan Note (Signed)
Pt with anorexia and unintentional weight loss.  Considerations include underlying malignancy- ordered non-contrast CT abd/pelvis.  Unable to find supporting medicare diagnosis to support CT chest coverage.  Will start with chest xray.  Dementia could be playing a role as could underlying depression- will continue to look at these issues.  Trial of megace for now.

## 2010-10-05 NOTE — Telephone Encounter (Signed)
SCHEDULED PATIENT FOR X RAY AND CT NEXT Thursday 4-19.  HUSBAND STATES SHE WILL NEED 1 PILL TO KEEP HER CALM.  [facility] [address]

## 2010-10-05 NOTE — Assessment & Plan Note (Signed)
She is hesitant to start any meds due to sister's rapid decline after starting namenda.  Consider MMSE next visit.

## 2010-10-05 NOTE — Patient Instructions (Signed)
Please complete your lab work on the first floor.  Schedule your chest x-ray and CT scan on the first floor.  You will be contacted about your referral to the Kidney specialist. Follow up in 1 month.

## 2010-10-05 NOTE — Telephone Encounter (Signed)
Called patient, spoke with husband.  Asked him to bring her by at their convenience for BMET- this was unable to be added on to the labs this AM. Notified him that xanax has been sent to pharmacy.

## 2010-10-06 ENCOUNTER — Telehealth: Payer: Self-pay | Admitting: *Deleted

## 2010-10-06 DIAGNOSIS — E785 Hyperlipidemia, unspecified: Secondary | ICD-10-CM

## 2010-10-06 NOTE — Telephone Encounter (Signed)
Pt reported to the lab today for TSH and BMP. Pt is requesting that lipid panel be ordered. Can we add that to the order today? Please advise.

## 2010-10-06 NOTE — Telephone Encounter (Signed)
OK to add on FLP.  (diagnosis hyperlipidemia).  The TSH was completed yesterday.  Only need to run BMP.

## 2010-10-06 NOTE — Telephone Encounter (Signed)
Order entered for lipid panel and faxed to the lab. Pt notified.

## 2010-10-07 ENCOUNTER — Encounter: Payer: Self-pay | Admitting: Family

## 2010-10-07 ENCOUNTER — Telehealth: Payer: Self-pay | Admitting: Family

## 2010-10-07 LAB — BASIC METABOLIC PANEL
BUN: 34 mg/dL — ABNORMAL HIGH (ref 6–23)
CO2: 23 mEq/L (ref 19–32)
Calcium: 9.5 mg/dL (ref 8.4–10.5)
Creat: 1.88 mg/dL — ABNORMAL HIGH (ref 0.40–1.20)

## 2010-10-07 NOTE — Telephone Encounter (Signed)
Leah Harper, from [facility] called  About the referral , she stated that Dr Kathrene Bongo review  The information and said the patient did need to be seen but it will a couple of months before she can be seen, Dr Kathrene Bongo  Suggested that the patient stop Cozar

## 2010-10-12 LAB — LIPID PANEL
HDL: 77 mg/dL (ref >39)
LDL Cholesterol: 111 mg/dL — ABNORMAL HIGH (ref 0–99)
Triglycerides: 80 mg/dL (ref <150)

## 2010-10-13 ENCOUNTER — Ambulatory Visit (INDEPENDENT_AMBULATORY_CARE_PROVIDER_SITE_OTHER)
Admission: RE | Admit: 2010-10-13 | Discharge: 2010-10-13 | Disposition: A | Discharge status: Home / Self Care | Payer: Medicare Other | Source: Ambulatory Visit | Attending: Family | Admitting: Family

## 2010-10-13 ENCOUNTER — Ambulatory Visit (HOSPITAL_BASED_OUTPATIENT_CLINIC_OR_DEPARTMENT_OTHER)
Admission: RE | Admit: 2010-10-13 | Discharge: 2010-10-13 | Disposition: A | Discharge status: Home / Self Care | Payer: Medicare Other | Source: Ambulatory Visit | Attending: Family | Admitting: Family

## 2010-10-13 DIAGNOSIS — I700 Atherosclerosis of aorta: Secondary | ICD-10-CM | POA: Insufficient documentation

## 2010-10-13 DIAGNOSIS — R630 Anorexia: Secondary | ICD-10-CM | POA: Insufficient documentation

## 2010-10-13 DIAGNOSIS — R634 Abnormal weight loss: Secondary | ICD-10-CM

## 2010-10-13 DIAGNOSIS — M5137 Other intervertebral disc degeneration, lumbosacral region: Secondary | ICD-10-CM | POA: Insufficient documentation

## 2010-10-13 DIAGNOSIS — M51379 Other intervertebral disc degeneration, lumbosacral region without mention of lumbar back pain or lower extremity pain: Secondary | ICD-10-CM | POA: Insufficient documentation

## 2010-10-13 DIAGNOSIS — I517 Cardiomegaly: Secondary | ICD-10-CM | POA: Insufficient documentation

## 2010-10-13 DIAGNOSIS — I4891 Unspecified atrial fibrillation: Secondary | ICD-10-CM | POA: Insufficient documentation

## 2010-10-13 DIAGNOSIS — I714 Abdominal aortic aneurysm, without rupture, unspecified: Secondary | ICD-10-CM | POA: Insufficient documentation

## 2010-10-13 DIAGNOSIS — K5730 Diverticulosis of large intestine without perforation or abscess without bleeding: Secondary | ICD-10-CM | POA: Insufficient documentation

## 2010-10-13 DIAGNOSIS — M47817 Spondylosis without myelopathy or radiculopathy, lumbosacral region: Secondary | ICD-10-CM | POA: Insufficient documentation

## 2010-10-14 ENCOUNTER — Telehealth: Payer: Self-pay | Admitting: Family

## 2010-10-14 DIAGNOSIS — I714 Abdominal aortic aneurysm, without rupture: Secondary | ICD-10-CM

## 2010-10-16 ENCOUNTER — Encounter: Payer: Self-pay | Admitting: Family

## 2010-10-17 ENCOUNTER — Telehealth: Payer: Self-pay | Admitting: Family

## 2010-10-17 NOTE — Telephone Encounter (Signed)
Received message from pt's husband, [name] wanting to know the results of pt's CT abd/pelvis. Please advise.

## 2010-10-17 NOTE — Telephone Encounter (Signed)
Please call Leah Harper and let him know that his wife's  CT and chest x-ray do not show any concerning cause for her weight loss. This is good news. There was note of a small abdominal aneurysm-  We will watch this with a once a year ultrasound to make sure that it is not getting larger.

## 2010-10-17 NOTE — Telephone Encounter (Signed)
Called husband, no answer. Left Message for return call.

## 2010-10-17 NOTE — Telephone Encounter (Signed)
Notified pt's husband of results per Sandford Craze, NP.

## 2010-10-18 ENCOUNTER — Encounter: Payer: Self-pay | Admitting: Family

## 2010-10-18 NOTE — Telephone Encounter (Signed)
Spoke with patient's husband yesterday- results were reviewed.

## 2010-10-21 ENCOUNTER — Telehealth: Payer: Self-pay | Admitting: *Deleted

## 2010-10-21 MED ORDER — CIPROFLOXACIN HCL 250 MG PO TABS: 250.0000 mg | ORAL_TABLET | Freq: Two times a day (BID) | ORAL | Status: AC

## 2010-10-21 NOTE — Telephone Encounter (Signed)
Patients husband returned phone call. Best # to reach at is [phone]

## 2010-10-21 NOTE — Telephone Encounter (Signed)
Cipro 250mg  1 tablet twice a day # 14 x no refills per Sandford Craze, NP called to [place] at the number provided below. Notified pt's husband.

## 2010-10-21 NOTE — Telephone Encounter (Signed)
Pls confirm that she has completed bactrim.  Will treat with cipro.  Pt will need to be seen in office if she develops fever, if symptoms worsen or if they are not improved in 48 hours.

## 2010-10-21 NOTE — Telephone Encounter (Signed)
Received message from pt's husband stating they were in [place] and are leaving for the beach in the a.m. Pt thinks she has a UTI again and he is [facility] [place] [phone]. Attempted to reach pt, no answer, no voicemail.

## 2010-10-21 NOTE — Telephone Encounter (Signed)
Pt's husband states that pt did not complete all of the last antibiotic. Took 8 of the 12 that were prescribed and was feeling better and did not finish the course. Pt's dysuria has returned and would like to know if we could send another round of antibiotic to the pharmacy for her as they are out of town and leaving for the beach in the morning. Please advise.

## 2010-11-02 ENCOUNTER — Encounter: Payer: Self-pay | Admitting: Family

## 2010-11-02 ENCOUNTER — Ambulatory Visit (INDEPENDENT_AMBULATORY_CARE_PROVIDER_SITE_OTHER): Payer: Medicare Other | Admitting: Family

## 2010-11-02 DIAGNOSIS — F329 Major depressive disorder, single episode, unspecified: Secondary | ICD-10-CM

## 2010-11-02 DIAGNOSIS — R630 Anorexia: Secondary | ICD-10-CM

## 2010-11-02 DIAGNOSIS — R5381 Other malaise: Secondary | ICD-10-CM

## 2010-11-02 DIAGNOSIS — F320 Major depressive disorder, single episode, mild: Secondary | ICD-10-CM | POA: Insufficient documentation

## 2010-11-02 DIAGNOSIS — R5383 Other fatigue: Secondary | ICD-10-CM | POA: Insufficient documentation

## 2010-11-02 DIAGNOSIS — R413 Other amnesia: Secondary | ICD-10-CM

## 2010-11-02 DIAGNOSIS — F32A Depression, unspecified: Secondary | ICD-10-CM | POA: Insufficient documentation

## 2010-11-02 NOTE — Patient Instructions (Signed)
Start megace- follow up in 1 month.

## 2010-11-02 NOTE — Progress Notes (Signed)
  Subjective:    Patient ID: LAVEYAH ORIOL, female    DOB: 04-11-1920, 77 y.o.   MRN: [id number]  HPI  Ms. Kinnick is an 77 year old female who presents today for followup.  #1 anorexia-she reports that she has not used the Megace. She is concerned about the indications that she read about the drug. Her husband remains concerned about her poor by mouth intake. The patient seems to think that her intake has improved somewhat. Her weight is the same today as it was one month ago.  Fatigue-her husband notes that he will often catch or napping in the middle of the day. He does not that she sleeps fairly well at night.  Review of Systems GI denies associated dysphagia or nausea   psychiatric: She denies depression. Neuro: She notes some forgetfulness.    Objective:   Physical Exam General: Thin elderly female, awake, alert and in no acute distress Head normocephalic atraumatic Cardiovascular: S1-S2 regular rate and rhythm Respiratory: Breath sounds clear to auscultation bilaterally without rales or wheezes Rales or rhonchi Neuro: Heart of hearing Psychiatric: Calm and pleasant.       Assessment & Plan:  30 minutes were spent with the patient today, greater than 50% of this time was spent counseling the patient on her depression, weight loss, and anorexia.

## 2010-11-02 NOTE — Assessment & Plan Note (Signed)
The patient's lab work has been unremarkable as far. She also underwent a CT of the abdomen and pelvis which was unremarkable and a negative chest x-ray. At this time there does not appear to be in obvious cause for her anorexia, but I feel that her symptoms are likely multifactorial in the setting of mild depression, mild dementia, and advanced age. She is agreeable to a one-month trial of Megace at which time we will reevaluate. I did recommend a referral to gastroenterology for further evaluation, however she and her husband wish to try Megace first. Given her advanced age I think this is a reasonable approach.

## 2010-11-02 NOTE — Assessment & Plan Note (Signed)
A Mini-Mental was performed today and the patient scored 22/30 which places her in the mild cognitive impairment range. She is adamantly opposed to adding any medication such as Aricept to her regimen at this time, as her sister who also suffered from dementia apparently deteriorated very quickly after starting these medications and she associates her deterioration with the medication. Consider checking a B12 and folate next visit.

## 2010-11-02 NOTE — Assessment & Plan Note (Signed)
PHQ-9 performed today in the office. The patient scored 9 placing her in the minimal symptoms category. At this point I do not feel strongly that she needs antidepressant, however we should keep this in mind as a possible contributing factor to her poor by mouth intake and increased sleepiness lately.

## 2010-11-02 NOTE — Assessment & Plan Note (Signed)
She has recently completed a TSH and a CBC both of which were within normal limits. We discussed good sleep hygiene today at her visit. I recommended that she try to OM and a daytime napping. In try to set aside a plus hours of continuous sleep at night.

## 2010-11-08 NOTE — Assessment & Plan Note (Signed)
[facility]                            CARDIOLOGY OFFICE NOTE   KAYTON, RIPP                        MRN:          [id number]  DATE:04/06/2008                            DOB:          1919/09/14    Leah Harper returns today for followup.  She has significant hypertension,  hypercholesterolemia, and PAF.  Unfortunately, she did not start the  lisinopril I prescribed her.  She has significant hypertension.  She is  concerned about all the side effects.  I explained to her that any  medication has side effects the low-dose lisinopril should be well  tolerated.  She was concerned about using salt substitute.  She has been  using it for 7 years.  I told her that ACE inhibitors can retained  potassium and she should probably go back to using regular salt on a  limited basis.   I did tell her that we will monitor this closely.  I would like to get a  BMET in 4 weeks after she starts to lisinopril.  She confirms that her  blood pressures have been running least 160 systolic at home.  She has  not had any significant recurrent palpitations, PND, or orthopnea, no  chest pain.   She has been traveling quite a bit and working on her garden.   CURRENT MEDICINES:  1. Metoprolol 50 a day.  2. Zetia 10 a day.  3. Aspirin a day.  [facility] [address]   PHYSICAL EXAMINATION:  GENERAL:  Exam is remarkable for a well-preserved  elderly white female in no distress.  VITAL SIGNS:  Blood pressure is 160/68, pulse 64 and regular, weight  113.  HEENT:  Unremarkable.  NECK:  Carotids are normal without bruit.  No lymphadenopathy,  thyromegaly, or JVP elevation.  LUNGS:  Clear with good diaphragmatic motion.  No wheezing.  HEART:  S1 and S2 with an S4 gallop.  PMI normal.  ABDOMEN:  Benign.  Bowel sounds positive.  No AAA.  No tenderness.  No  bruit.  No hepatosplenomegaly or hepatojugular reflux.  No tenderness.  No bruit.  EXTREMITIES:  Distal pulses are  intact.  No edema.  NEURO:  Nonfocal.  SKIN:  Warm and dry.  MUSCULOSKELETAL:  No muscular weakness.   EKG shows sinus rhythm with LVH.   IMPRESSION:  1. Hypertension, noncompliant with recommendations for addition of ACE-      inhibitor.  The patient to start lisinopril 10 a day.  Continue low-      sodium diet.  Follow up basic metabolic panel in 4 weeks after      starting meds.  Follow up in 3 months.  2. History of paroxysmal atrial fibrillation likely secondary to      poorly treated hypertension, currently in sinus rhythm.  Continue      aspirin therapy.  3. Hyperlipidemia.  Continue Zetia 10 mg a day per Dr. Waynard Edwards.  4. Health maintenance.  The patient needs a cataract surgery.  I have      clear her to have her cataract surgery.  Her 2-D echocardiogram  showed an ejection fraction of 55% with some aortic valve      sclerosis.  There was no significant valvular heart disease.  Her      septal thickness was only 12 mm.   Again, the patient is cleared for any type surgery that she may need.     Noralyn Pick. Eden Emms, MD, [facility]  Electronically Signed    PCN/MedQ  DD: 04/06/2008  DT: 04/06/2008  Job #: [id number]

## 2010-11-08 NOTE — Assessment & Plan Note (Signed)
[facility]                            CARDIOLOGY OFFICE NOTE   Leah, Harper                        MRN:          [id number]  DATE:07/07/2008                            DOB:          Feb 03, 1920    Leah Harper was seen today in followup.  She has had PAF and hypertension.   She has had a recent URI and has been seen by Dr. Waynard Edwards.  She had some  atypical chest pain which sounded like some inflammation in her left  breast.  She has no previously documented coronary artery disease.  She  still seems to have some issues with compliance with her medications,  and her lisinopril has helped her blood pressure, but she seems to think  that she has some neuropathy, pruritus, and a cough with it.  I told her  we would switch to Cozaar 25 a day in lieu of this.   She is also allergic to CODEINE.   Her review of systems is otherwise negative.  She has not had recurrent  chest pain.  There has been no dyspnea.  No PND or orthopnea.  No lower  extremity swelling.   Her current medications include:  1. Lopressor 50 a day.  2. Zetia 10 a day.  3. An aspirin a day,  4. Lisinopril 10 a day to be stopped.  5. Cozaar 25 a day to be started.   [facility] [address]   PHYSICAL EXAMINATION:  GENERAL:  Remarkable for a normal affect.  She  looks younger than her stated age.  Her husband is with her today, they  have been married 63 years.  VITAL SIGNS:  Weight is 113.  Blood pressure 160/60 and she has not  taken her medications this morning as she had some blood work at Dr.  Laurey Morale office.  Pulse 70 and regular, respiratory 14 and afebrile.  HEENT:  Unremarkable.  NECK:  Carotids normal without bruit.  No lymphadenopathy, thyromegaly,  JVP elevation.  LUNGS:  Clear.  Good diaphragmatic motion.  No wheezing.  HEART:  S1 and S2 with normal heart sounds PMI normal.  ABDOMEN:  Benign.  Bowel sounds positive.  No AAA.  No tenderness. No  bruit.   No hepatosplenomegaly, hepatojugular reflux, or tenderness.  EXTREMITIES:  Distal pulses are intact.  No edema.  NEUROLOGIC:  Nonfocal.  SKIN:  Warm and dry.  MUSCULOSKELETAL:  No muscular weakness.   IMPRESSION:  1. Hypertension.  I suspect it is optimally controlled, but the      patient did not take her medications this morning to the lab work.      I explained to her that she could take these medicines even on an      empty stomach.  We will switch her over to Cozaar from lisinopril      due to her cough.  I will see her back in 3 months.  Continue low-      sodium diet.  2. Atypical chest pain.  Follow up with Dr. Waynard Edwards.  No evidence of  previous coronary artery disease.  Continue baby aspirin.  3. History of paroxysmal atrial fibrillation.  Currently maintaining      sinus rhythm.  Coumadin stopped.  Take baby aspirin.  Continue beta-      blocker.  4. Health maintenance.  The patient was inquiring about the shingle      shot.  I told her that it would be a reasonable shot for her to get      given her advanced age and increased risk of getting shingles.  She      will talk to Dr. Waynard Edwards about this.   I will see her back in 3 months.     Noralyn Pick. Eden Emms, MD, [facility]  Electronically Signed    PCN/MedQ  DD: 07/07/2008  DT: 07/07/2008  Job #: [id number]

## 2010-11-08 NOTE — Assessment & Plan Note (Signed)
[facility]                            CARDIOLOGY OFFICE NOTE   Leah Harper, Leah Harper                        MRN:          [id number]  DATE:03/04/2008                            DOB:          [birth year]    Leah Harper is a delightful 77 year old patient referred by Dr. Waynard Edwards.  I have actually seen Leah Harper many years ago, was either in 2001 and  2003.  Unfortunately, I do not have her old records.  She has had PAF in  the past.   She has had no documented coronary artery disease.  I do not have any  old EKGs on her.  Dr. Waynard Edwards had seen her and I suspect was somewhat  concerned about her EKG.  She had been on Coumadin for quite some time.  He stopped this last week.  I would agree with the decision, although  she appears to have ambient PACs.  She does not appear to have recurrent  atrial fibrillation.  Now, that she is 63, I do think the risk benefit  ratio of being on the medication is probably not worth it.   I told her in regards to this, she should probably take an aspirin a day  with food.   The patient is asymptomatic in regards to her heart.  She is very  active.  She is not having palpitations, PND, or orthopnea.  No chest  pain, no syncope, and no shortness of breath.   She is extremely well preserved for her age.   She does need to have some cataract surgery.  She thinks she may have  Dr. Dione Booze to do this.  I do not see any reason why she could not proceed  with surgery.  Her EKG from Dr. Laurey Morale office is abnormal.  She has an  incomplete left bundle-branch block with ST-T wave changes and PACs.  I  do not have an old one to compare to.   Leah Harper's blood pressure was fairly elevated in our office today at 180  systolic.  She has not taken it over the summer.  She is on metoprolol,  but no other medications.  I suspect that some of her EKG changes  reflect this.  I do have a echo report from 2003.  This showed normal LV  function and  no LVH.  I told Leah Harper that for the time being, I thought,  she need an echo to reassess her LV function in light of her bundle-  branch block and blood pressure and that we should probably start some  lisinopril 10 mg a day.   Dr. Laurey Morale note indicated some mild renal insufficiency.  We will  check a BMET about 4 weeks after starting the lisinopril to make sure  that her potassium and creatinine are not in the bad range.   The patient seems to agree with the stoppage of the Coumadin.   I do not think she needs a stress test or any other cardiac workup at  this time.   REVIEW OF SYSTEMS:  Otherwise, remarkable for some minor varicosities in  the lower extremities.   She is a nonsmoker and nondrinker.   She has been happily married for 62 years.  She has 3 children in  [place].  She is active.  Her and her husband would like to go to  [facility].  I believe I have taken care of her husband's brother  Leah Harper in the past as well.   PAST MEDICAL HISTORY:  Otherwise, remarkable for some fatigue, history  of PAF, history of cataracts, history of hypertension, and history of  TAHBSO with a bladder tack.  She is gravida 3 and para 3.  I believe she  has had a normal Cardiolite in May 2003, but otherwise has been fairly  healthy.   FAMILY HISTORY:  Noncontributory.   CURRENT MEDICATIONS:  1. Metoprolol 50 a day.  2. Zetia 10 a day.  3. She uses [facility] on [address].   ALLERGIES:  She is allergic to CODEINE.   PHYSICAL EXAMINATION:  GENERAL:  Her exam is remarkable for well-  preserved 77 year old female in no distress.  VITAL SIGNS:  Blood pressure was 188/71, pulse 66 with occasional PACs,  afebrile, and respiratory rate 14.  HEENT:  Unremarkable.  NECK:  Carotids are normal without bruit.  No lymphadenopathy,  thyromegaly, or JVP elevation.  LUNGS:  Clear, good diaphragmatic motion.  No wheezing.  HEART:  S1 and S2 with an S4 gallop.  PMI normal.   ABDOMEN:  Benign.  Bowel sounds positive.  No AAA.  No tenderness.  No  bruit.  No hepatosplenomegaly or hepatojugular reflux.  EXTREMITIES:  Distal pulses are intact.  No edema.  Faint spider veins  in the lower extremities.  NEURO:  Nonfocal.  SKIN:  Warm and dry.  MUSCULOSKELETAL:  No muscular weakness.   Notes from Dr. Waynard Edwards reviewed EKGs there show sinus rhythm with PACs,  left bundle-branch block, and a QT of 456.   IMPRESSION:  1. History of paroxysmal atrial fibrillation, currently asymptomatic      and in sinus rhythm.  Coumadin to be stopped.  Take an aspirin a      day.  2. Left bundle-branch block in the setting of hypertension.  Check 2-D      echocardiogram to assess left ventricular left ventricular function      and septal thickness.  3. Preop clearance for cataract surgery, low-risk asymptomatic.  The      patient may proceed with surgery without further testing.  4. Hypercholesterolemia, presumed.  I do not know that she is      intolerant to statins, but she is on Zetia.  Followup with Dr.      Waynard Edwards.  Lipid and liver profile as indicated.  5. Hypertension.  Encouraged the patient to get a blood pressure cuff      and take her blood pressure 4 to 5 times per week.  Start      lisinopril 10 mg a day.  The patient will follow up with me in 4-6      weeks to reassess her blood pressure and rhythm.  At that point, I      will go over her echocardiogram.  So long as she seems to have a      reasonable response to the lisinopril, she can then follow up with      Dr. Waynard Edwards.   It was actually quite nice to see Leah Harper after all these years.  Her and  her husband are very nice people, and  I will look forward to seeing her  in the future.     Leah Harper. Leah Emms, MD, [facility]  Electronically Signed    PCN/MedQ  DD: 03/04/2008  DT: 03/04/2008  Job #: [id number]   cc:   Loraine Leriche A. [name], M.D.

## 2010-11-11 NOTE — H&P (Signed)
[facility]  Patient:    Leah Harper, Leah Harper                        MRN: [id number] Adm. Date:  06/27/00 Attending:  Rodrigo Ran, M.D. CC:         Larena Glassman, M.D., [facility]   History and Physical  PRIMARY CARE PHYSICIAN:  Larena Glassman, M.D. in [place].  CHIEF COMPLAINT:  Weakness and dizziness and pallor.  HISTORY OF PRESENT ILLNESS:  Ms. Rhodes is a very pleasant, 77 year old female with a past medical history significant for hyperlipidemia.  She was in her usual state of health; however, has noted a little bit of pallor over the last one to two weeks.  Furthermore, over the last three to four days, she has had profound weakness to where she had to sit down after standing after 10 or 15 minutes.  She also reported some mild orthostatic symptoms.  She presented to the emergency department and was found to have a hemoglobin of 6.5 and heme-positive dark stool.  We have been asked to evaluate the patient and assume primary care of the patient.  PAST MEDICAL HISTORY: 1. Hyperlipidemia. 2. No history of stroke, coronary disease, diabetes, or hypertension. 3. History of TAH/BSO and bladder tack remotely. 4. No history of anemia.  Last blood count was approximately in May    of this year or September of last year.  G3, P3 parity status, all    normal vaginal deliveries. 5. Possibly history of mild diverticulosis as seen on flexible sigmoidoscopy    about two years ago but no history of polyps.  ALLERGIES:  CORTIZONE which causes night mares.  MEDICATIONS: 1. Currently receiving normal saline at 200 cc/h. 2. She is on Zocor 20 mg q.h.s. 3. Baby coated aspirin q.d. 4. Claritin as needed. 5. Sudafed as needed.  SOCIAL HISTORY:  She moved here in September of 2001.  She has been married since 56.  She has two sons and a daughter.  She has no tobacco, alcohol, or drug use.  FAMILY HISTORY:  Mother died at age 39 of coronary disease and  Alzheimers disease.  Father died at age 12 of a myocardial infarction.  REVIEW OF SYSTEMS:  The patient felt like she might be getting a cold.  She felt very weak for the last three or four days.  She was feeling fine before this.  She denies any fevers, chills, nausea, vomiting, or diarrhea.  She has some chronic constipation difficulties.  She has had black stools for two years but reportedly a normal Hemoccult testing.  She has had no change in her stool color or any bright red blood, or frequent tarry stools recently.  She has had no GU symptoms.  She has been under some social stresses with the recent move but denies any current or past chest pain or shortness of breath.  PHYSICAL EXAMINATION:  VITAL SIGNS:  Temperature 96.6, respiratory rate 18, pulse 85, blood pressure 150/38.  Orthostatics do show a mild drop in systolic from 140 to 128 from lying to sitting, but this is not confirmed on standing.  She is in no acute distress.  She does have some pallor of her conjunctivae and some decreased capillary refill.  There is no icterus.  HEENT:  Pupils equal, round, and reactive to light.  Extraocular movements are intact.  Oropharynx is clear.  There is no JVD.  LUNGS:  Clear to auscultation bilaterally with excellent air  movement.  There are no wheezes, rubs, or rhonchi.  HEART:  Regular with no murmur, rub or gallop.  There is normal S1, S2.  ABDOMEN:  There are positive bowel sounds.  There is some mild diffuse tenderness but no rebound or guarding.  There is no hepatosplenomegaly or edema.  NEUROLOGICAL:  Cranial nerves II-XII are intact.  The patient moves extremities x 4.  He is alert and oriented x 4.  Head appropriate.  She has nl sensation grossly and normal deep tendon reflexes.  RECTAL:  Reveals normal tone.  She has some firm stool in the rectal vault, which is black to gray.  This is markedly heme-positive on Hemoccult testing.  LABORATORY DATA:  CK 37, MB less  than 0.3, troponin I less than 0.01.  BMET is normal with the exception of a mildly elevated BUN of 25.  LFTs are normal with the exception of a mildly low albumin of 3.3.  ECG shows normal sinus rhythm with inferior lateral ST depression.  White blood count of 6.3 with a normal differential.  Hemoglobin 6.5, platelet count 248,000, MCV is 88.  ASSESSMENT AND PLAN:  A 77 year old female with profound normocytic anemia, heme-positive dark stool, and inferolateral ST depression with no chest pain. 1. Anemia.  Suspect an upper gastrointestinal source, likely peptic ulcer    disease or gastritis.  Will recheck CBC, differential to confirm    hemoglobin, although I believe this is an accurate blood count.  We will    check iron levels, B12, folic acid, TSH, and type and cross for two units    of packed red blood cells.  Would favor transfusing two units of red cells    now; however, the patient has significant fears of a blood transfusion and    would like to hold off on this as much as possible.  Therefore we will    follow serial hemoglobins and start Epogen and iron, but if the count drops    below 6 or she has any evidence of definite ischemia, clinically, we will    more seriously urge transfusion.  We will start Protonix intravenously and    will hold aspirin currently.  Cardiovascular will check serial enzymes and    ECGs and will obtain old ECG from her old doctors office. 2. Deep vein thrombosis prophylaxis with lower extremity compression devices.    Will hydrate with D-5 normal saline.  Will make patient n.p.o. except    medications as gastroenterology may wish to proceed further with testing.    Furthermore will consult gastroenterology for help with evaluation and    management. DD:  06/27/00 TD:  06/27/00 Job: [id number]

## 2010-11-11 NOTE — H&P (Signed)
[place]  Patient:    Leah Harper, GANAWAY Visit Number: [id number] MRN: [id number]          Service Type: MED Location: [phone] Attending Physician:  Colon Branch Dictated by:   Noralyn Pick Eden Emms, M.D. [facility] Admit Date:  10/08/2001 Discharge Date: 10/11/2001   CC:         Rodrigo Ran, M.D., [facility]   History and Physical  HISTORY OF PRESENT ILLNESS:  The patient is a very pleasant 77 year old patient of Dr. Waynard Edwards who has been previously healthy.  She has had long standing history of palpitations but no documented arrhythmia.  She is physically active and had a negative stress test a few years ago.  She has noted significant palpitations today and was found in the emergency department to be in rapid atrial fibrillation.  She has coronary risk factors which include hyperlipidemia.  The patient has had a history of severe anemia and probable gastrointestinal bleeding one year ago.  She tells me she had an upper and lower endoscopy and no source was found.  She has not had any recent problems and she has been watching her stools closely.  Today the patient had no chest pain or dyspnea but rapid palpitations and felt weak.  She was easily slowed down in the emergency department with Cardizem.  REVIEW OF SYSTEMS:  Otherwise negative.  PAST MEDICAL HISTORY:  Remarkable for total abdominal hysterectomy, tonsillectomy.  The patient has a history of hyperthyroidism remotely in 1941.  ALLERGIES:  CORTISONE, basically makes the patient feel high.  MEDICATIONS:  Pravachol 40 mg q.d.  PHYSICAL EXAMINATION:  GENERAL:  The patient looks very well for her age.  She is in atrial fibrillation currently at a rate of 80.  LUNGS:  Clear, carotids normal.  CHEST:  There is S1, S2 without murmur, rub, gallop or click.  ABDOMEN:  Benign.  EXTREMITIES:  Intact pulses with no edema.  ELECTROCARDIOGRAM:  Atrial fibrillation with  nonspecific ST-T wave changes. There is no acute ST elevation.  CHEST X-RAY:  Pending at this time.  CBC:  Hematocrit is 39.3.  PROTIME:  Normal.  BUN 23, creatinine 1, glucose 93.  IMPRESSION:  I had a long discussion with the patient in regards to atrial fibrillation.  She has had a sister who had atrial fibrillation that had a stroke before she was placed on Coumadin.  This sister is currently at our Coumadin clinic at the [facility] so she is fairly understanding of the process.  Will slow the patient down with some Cardizem  orally.  She will have a stress test and echocardiogram tomorrow to rule out structural heart disease.  The patient will be placed on heparin.  I told the patient we will place her on Coumadin for three to four weeks and see if she converted on her own.  If she does not we will then consider cardioversion. We will have to watch the patient closely in regards to any recurrent gastrointestinal bleeding.  Will have to get her records from Dr. Waynard Edwards.  If she has guaiac positive stools she may need further studies of her small bowel in regards to occult gastrointestinal bleeding.  Further recommendations will be based on the results of her stress test and echocardiogram. Dictated by:   Noralyn Pick. Eden Emms, M.D. [facility] Attending Physician:  Colon Branch DD:  10/08/01 TD:  10/08/01 Job: [id number]

## 2010-11-11 NOTE — Discharge Summary (Signed)
[place]  Patient:    Leah Harper, Leah Harper Visit Number: [id number] MRN: [id number]          Service Type: MED Location: [phone] Attending Physician:  Colon Branch Dictated by:   Joellyn Rued, P.A.-C. Admit Date:  10/08/2001 Discharge Date: 10/11/2001   CC:         Rodrigo Ran, M.D.  Coumadin Clinic [facility]   Referring Physician Discharge [place]  DATE OF BIRTH:  May 04, 1920  SUMMARY OF HISTORY:  Ms. Barbary is a 77 year old white female, who has a long history of palpitations but no documented arrhythmia.  She is very physically active; however, she presented to the emergency room on the day of admission with significant palpitations and was found to be in atrial fibrillation.  Her history is notable for severe anemia and probable gastrointestinal bleeding one year ago without any source found and hyperlipidemia and hypothyroidism in 1941.  LABORATORY DATA:  TSH was 1.798, T4 5.9.  Total cholesterol was 211, triglycerides 83, HDL 64, LDL 130.  CKs and troponins were negative for myocardial infarction.  Admission sodium was 140, potassium 4.1, BUN 23, creatinine 1.1, normal LFTs.  Albumin was slightly low at 3.4.  PT on admission was 13.1, PTT 30.  H&H was 13.8 and 39.3, normal indices, platelets 245, WBC 6.2.  Subsequent chemistries and hematologies were essentially unremarkable.  INR on the 16th was 1.2, the 17th 1.4.  Adenosine Cardiolite showed mild septal hypokinesis, EF 67%, no ischemia. Chest x-ray did not reveal any active disease.  EKGs on admission showed atrial fibrillation with a rapid ventricular rate.  Prior to discharge, she was in normal sinus rhythm.  Echocardiogram obtained on October 09, 2001, showed an EF of 55-65%.  No wall motion abnormalities.  Mildly calcified aortic valve.  Mildly dilated right atrium.  HOSPITAL COURSE:  Ms. Delconte was admitted to the unit 4700 and placed on IV heparin as well as  Cardizem.  Echocardiogram was performed, as previously described, and adenosine Cardiolite was performed as previously described. Dr. Waynard Edwards saw her in consultation on October 10, 2001, and noted to call if questions.  During her admission, she did have several episodes of increased heart rate, and her medications were adjusted.  By the evening of October 10, 2001, she converted to normal sinus rhythm, sinus bradycardia.  By October 11, 2001, her INR was 2.2, and Dr. Eden Emms felt that she could be discharged home.  It was noted that Coumadin was started on the day of admission at 5 mg.  However, on October 09, 2001, she received a dose of 10 mg.  DISCHARGE DIAGNOSES: 1. Atrial fibrillation with a rapid ventricular rate, converting to normal    sinus rhythm. 2. History as previously.  DISPOSITION:  Ms. Corrow is discharged home.  DISCHARGE MEDICATIONS: 1. She received a new prescription for Coumadin 5 mg q.d. 2. Cardizem CD 120 q.d. 3. Toprol XL 50 mg q.d. 4. It was noted at the time of discharge that Ms. Gallardo was supposed to be on    Pravachol 40 mg q.h.s.; she discontinued approximately 1 month ago in    regards to leg cramping.  She was asked to continue to hold this medication    until she discusses this with Dr. Eden Emms.  DISCHARGE INSTRUCTIONS 1. She was asked to maintain a low salt, low fat, low cholesterol diet. 2. She was asked to avoid pseudoephedrine products. 3. She will call the Coumadin Clinic on  Monday to have a PT/INR checked on    Monday. 4. She will also call our office on Monday to arrange a four week appointment    with Dr. Eden Emms and to keep her appointment on Nov 12, 2001, with    Dr. Waynard Edwards for her general physical. Dictated by:   Joellyn Rued, P.A.-C. Attending Physician:  Colon Branch DD:  10/11/01 TD:  10/11/01 Job: [id number]

## 2010-11-11 NOTE — Consult Note (Signed)
[facility]  Patient:    Leah Harper, Leah Harper                        MRN: [id number] Proc. Date: 06/27/00 Attending:  Roosvelt Harps, M.D. CC:         Rodrigo Ran, M.D.   Consultation Report  REASON FOR CONSULTATION:  Guaiac-positive stool and normocytic anemia.  HISTORY OF PRESENT ILLNESS:  Ms. Leah Harper is a generally healthy 77 year old female who, on Saturday, began feeling extremely weak; this was three to four days ago; however, in the past two to three weeks, she has noticed that she has been a little pale and tired.  She may have experienced mild orthostatic symptoms on Saturday but was not orthostatic in the emergency room; however, her hemoglobin was found to be 6.5 and has decreased to 6.3 today and her stool was dark and heme-positive.  She, herself, has not noticed any maroon stool or marked melena; however, she says her stool seems to always be dark. She takes a baby aspirin every day and has done so for the last six years. She denies any dysphagia, abdominal pain or loss of weight.  She has no other gastrointestinal symptoms or prior history of GI bleeding.  PAST MEDICAL HISTORY:  Pertinent for hyperlipidemia.  PAST SURGICAL HISTORY:  Pertinent for hysterectomy and bladder suspension surgery.  She reportedly had a negative flexible sigmoidoscopy two years ago.  CURRENT MEDICATIONS 1. Zocor 20 mg q.h.s. 2. Baby aspirin one per day. 3. Claritin p.r.n. 4. Sudafed p.r.n.  ALLERGIES:  No true allergies.  CORTISONE has caused her to have psychiatric disturbances of nightmares.  FAMILY HISTORY:  Family history is negative for ulcers, gallstones, inflammatory bowel disease or gastrointestinal neoplasia.  SOCIAL HISTORY:  She is married with three adult children, nonsmoker, nondrinker.  REVIEW OF SYSTEMS:  GENERAL:  No weight loss or night sweats.  ENDOCRINE:  No history of diabetes or thyroid problems.  SKIN:  No rash or pruritus.  EYES: No  icterus or change in vision.  ENT:  No aphthous ulcers or chronic sore throat.  RESPIRATORY:  No shortness of breath, cough or wheezing.  CARDIAC: History of hyperlipidemia.  No chest pain.  Occasional palpitations. In-hospital EKG suggests inferolateral ischemia and she has had nonsustained SVT.  GI:  As above.  GU:  No dysuria or hematuria.  The remainder of the review of systems is negative.  PHYSICAL EXAMINATION  GENERAL:  She is a well-developed, well-nourished adult female in no acute distress, afebrile, with a blood pressure of 116/49 and pulse of 74 and regular.  SKIN:  Normal.  HEENT:  Eyes anicteric.  Oropharynx unremarkable.  NECK:  Supple without thyromegaly.  NODES:  There is no cervical or inguinal adenopathy.  CHEST:  Clear to auscultation and percussion.  HEART:  Heart sounds are regular rate and rhythm without murmurs, rubs, or gallops.  ABDOMEN:  Soft without mass, tenderness or organomegaly.  There is no rebound or hernia.  Bowel sounds are normal.  RECTAL:  Exam is not performed.  EXTREMITIES:  Without clubbing, cyanosis, edema or rash.  LABORATORY FINDINGS:  Laboratory tests reveal hemoglobin of 6.3, which is normocytic, white blood count 5.4, platelet count 224,000.  Electrolytes are normal.  BUN is mildly elevated at 25.  IMPRESSION:  Elderly female on aspirin with gastrointestinal bleeding.  Her dark stool and history of aspirin use and mildly elevated BUN would suggest an upper gastrointestinal bleed; however, this is  essentially asymptomatic. Right colonic bleeding could also give some of the same parameters.  PLAN:  The patient has agreed to transfusion, which I have also encouraged. Upper endoscopy is clearly indicated.  The procedure is reviewed in terms of technique, preparation and risk of complications including bleeding and perforation; she agrees to proceed.  We will perform upper endoscopy later this morning.  She will remain n.p.o.  till its conclusion.  Consideration will be given to colonoscopy tomorrow, unless a source for her bleeding is found. Further recommendations to follow the EGD. DD:  06/27/00 TD:  06/27/00 Job: [id number]

## 2010-11-11 NOTE — Discharge Summary (Signed)
[facility]  Patient:    Leah Harper, Leah Harper                        MRN: [id number] Adm. Date:  [id number] Disc. Date: [id number] Attending:  Ezequiel Kayser CC:         Caryl Pina, M.D., [place], [place]  Roosvelt Harps, M.D.   Discharge Summary  PRIMARY CARE PHYSICIAN:  Dr. Caryl Pina in [place].  DISCHARGE DIAGNOSES 1. Gastrointestinal bleed, questionable source, with no clear cause found on    esophagogastroduodenoscopy and colonoscopy, possibly related to aspirin use    or to a bleeding polyp. 2. Hyperlipidemia. 3. Hypokalemia, resolved. 4. Anemia of blood loss. 5. Abnormal electrocardiogram with ST abnormalities inferolaterally, with no    evidence of ischemia clinically.  DISCHARGE MEDICATIONS 1. Zocor 20 mg p.o. q.h.s. 2. Nu-Iron 150 mg p.o. q.d. 3. Protonix 40 mg p.o. q.d., to be continued for approximately one to two    further months. 4. She is to continue Claritin and Sudafed p.r.n.  SPECIAL INSTRUCTIONS:  Patient is to avoid aspirin products until further advised.  HISTORY OF PRESENT ILLNESS:  Leah Harper is a pleasant 77 year old female with a history of hyperlipidemia who developed weakness and pallor and some orthostatic symptoms.  She presented to the emergency department on June 27, 2000 and was found to have a hemoglobin of 6.5 with heme-positive dark stool.  She was admitted for further evaluation of this.  HOSPITAL COURSE:  Leah Harper was admitted and gastroenterology was consulted. She underwent EGD, which was unremarkable.  She subsequently underwent colonoscopy, at which time two small polyps were removed.  There was no acute bleeding noted but it was felt that this might have caused the blood loss. She also had some minimal diverticulosis.  Patient, during her admission, remained stable.  She did have some hypokalemia with a potassium of 2.7 but this was replaced successfully, both intravenously and orally.   She received a total of three units of packed red blood cell transfusion, with hemoglobin improving to 10.2 at the time of discharge.  Iron studies and vitamin levels were performed which did suggest some iron loss, although the patient did have a normocytic anemia on presentation.  The patient did have one episode of 20 to 30 beats of SVT that was asymptomatic and this was at the time of her hypokalemia; furthermore, she did have some ST segment abnormalities in the inferolateral leads on presentation that persisted through discharge.  The patient never had any chest pain or other symptoms and she is to be followed up in clinic for this.  She might require further stress test as an outpatient.  PHYSICAL EXAM ON DISCHARGE:  Temperature 97.4, pulse 77, respiratory rate 20, blood pressure 133/52.  Patient was in no acute distress, mildly pale.  Lungs were clear to auscultation bilaterally.  Heart was regular with no murmur, rub or gallop.  Abdomen was benign.  There was no edema.  Patient was alert and oriented x 4.  NOTABLE DISCHARGE LABORATORY DATA:  BMET was within normal limits.  White count was 8.1, platelet count 251,000, hemoglobin 10.2.  FOLLOWUP:  Leah Harper is to follow up in clinic with Dr. Rodrigo Ran in three weeks.  She is to follow up with her [facility] doctor, who she is due to have a complete physical with next week, for the sake of continuing care.  We will follow her hemoglobins serially and we will  do repeat stool cards as an outpatient in several weeks to be sure these are clearing; we will also follow her hemoglobins and possibly consider further cardiac evaluation if this seems necessary.  DIET AND ACTIVITY:  She is to resume a normal diet and to resume her normal activities. DD:  07/01/00 TD:  07/02/00 Job: [id number]

## 2010-11-23 ENCOUNTER — Encounter: Payer: Self-pay | Admitting: Family

## 2010-11-30 ENCOUNTER — Ambulatory Visit: Payer: Medicare Other | Admitting: Family

## 2010-12-05 ENCOUNTER — Encounter: Payer: Self-pay | Admitting: Family

## 2010-12-06 ENCOUNTER — Ambulatory Visit (INDEPENDENT_AMBULATORY_CARE_PROVIDER_SITE_OTHER): Payer: Medicare Other | Admitting: Family

## 2010-12-06 ENCOUNTER — Encounter: Payer: Self-pay | Admitting: Family

## 2010-12-06 ENCOUNTER — Ambulatory Visit: Payer: Medicare Other | Admitting: Family

## 2010-12-06 DIAGNOSIS — E785 Hyperlipidemia, unspecified: Secondary | ICD-10-CM

## 2010-12-06 DIAGNOSIS — I10 Essential (primary) hypertension: Secondary | ICD-10-CM

## 2010-12-06 DIAGNOSIS — R630 Anorexia: Secondary | ICD-10-CM

## 2010-12-06 DIAGNOSIS — N289 Disorder of kidney and ureter, unspecified: Secondary | ICD-10-CM

## 2010-12-06 DIAGNOSIS — R413 Other amnesia: Secondary | ICD-10-CM

## 2010-12-06 DIAGNOSIS — R634 Abnormal weight loss: Secondary | ICD-10-CM

## 2010-12-06 MED ORDER — MEGESTROL ACETATE 40 MG/ML PO SUSP: 400.0000 mg | Freq: Every day | ORAL | Status: AC

## 2010-12-06 NOTE — Assessment & Plan Note (Signed)
Will plan to check FLP next visit.

## 2010-12-06 NOTE — Progress Notes (Signed)
  Subjective:    Patient ID: Leah Harper, female    DOB: 1919/10/26, 77 y.o.   MRN: [id number]  HPI  1. Anorexia- husband thinks that this has improved her appetite.  She notes that she has been eating fairly well.  2.  Mood- up and down per husband.    3.  Energy- in improved per husband.  Not sleeping much during the day.  4.  Renal insuffciency-  Saw Dr. Briant Cedar.  He stopped her losartan.  Continues her metoprolol.  Amlodipine 2.5mg  was added to her regimen but she has not started yet.       Review of Systems  Respiratory: Negative for shortness of breath.   Denies lower extremity edema  Past Medical History  Diagnosis Date  . Hyperlipidemia   . Hypertension   . AF (atrial fibrillation)   . Hyperthyroidism     remotely in 1941  . Normocytic anemia     2002  . S/P TAH-BSO (total abdominal hysterectomy and bilateral salpingo-oophorectomy)   . Allergy   . Arrhythmia   . Anxiety     History   Social History  . Marital Status: Married    Spouse Name: [name]    Number of Children: 3  . Years of Education: N/A   Occupational History  . retired    Social History Main Topics  . Smoking status: Never Smoker   . Smokeless tobacco: Never Used  . Alcohol Use: No  . Drug Use: Not on file  . Sexually Active: Not on file   Other Topics Concern  . Not on file   Social History Narrative   RetiredMarried -3 children Tobacco abuse-noAlcohol use-no  Regular exercise-no    Past Surgical History  Procedure Date  . Tonsillectomy   . Total abdominal hysterectomy w/ bilateral salpingoophorectomy 1986  . Bilateral salpingoophorectomy   . Bladder suspension     bladder tack with TAHBSO    Family History  Problem Relation Age of Onset  . Heart disease Father   . Heart disease Brother     heart attack  . Heart disease Brother     No Known Allergies  Current Outpatient Prescriptions on File Prior to Visit  Medication Sig Dispense Refill  . aspirin 81 MG  tablet Take 81 mg by mouth daily.        Marland Kitchen ezetimibe (ZETIA) 10 MG tablet Take 10 mg by mouth daily.        . metoprolol (LOPRESSOR) 50 MG tablet Take 25 mg by mouth. 1/2 tablet by mouth in the morning       . DISCONTD: losartan (COZAAR) 25 MG tablet Take 25 mg by mouth daily.        Marland Kitchen DISCONTD: megestrol (MEGACE ORAL) 40 MG/ML suspension Take 10 mLs (400 mg total) by mouth daily.  240 mL  2    BP 140/78  Pulse 78  Temp(Src) 97.8 F (36.6 C) (Oral)  Resp 16  Ht 5\' 4"  (1.626 m)  Wt 98 lb 1.3 oz (44.489 kg)  BMI 16.84 kg/m2       Objective:   Physical Exam  Constitutional: She appears well-developed and well-nourished.  Cardiovascular: Normal rate and regular rhythm.   Pulmonary/Chest: Effort normal and breath sounds normal.  Musculoskeletal: She exhibits no edema.  Psychiatric: She has a normal mood and affect. Her behavior is normal. Judgment and thought content normal.          Assessment & Plan:

## 2010-12-06 NOTE — Assessment & Plan Note (Signed)
This is being managed by Dr. Briant Cedar.

## 2010-12-06 NOTE — Assessment & Plan Note (Signed)
Unchanged, she has declined treatment.

## 2010-12-06 NOTE — Assessment & Plan Note (Signed)
This is stable- will continue megace. Weight is unchanged since last visit.

## 2010-12-06 NOTE — Assessment & Plan Note (Signed)
SBP 140- ARB was discontinued due to renal insufficiency.  I think that she can start the amlodipine 2.5 which was prescribed by Dr Briant Cedar.

## 2010-12-06 NOTE — Patient Instructions (Signed)
Continue megace. Follow up in 3 months- come fasting to this appointment.  Have a nice summer!

## 2010-12-26 ENCOUNTER — Other Ambulatory Visit: Payer: Self-pay | Admitting: *Deleted

## 2010-12-26 MED ORDER — EZETIMIBE 10 MG PO TABS: 10.0000 mg | ORAL_TABLET | Freq: Every day | ORAL | Status: DC

## 2010-12-26 NOTE — Telephone Encounter (Signed)
Patients husband presented to office stating he tried to get a rx refilled for Leah Harper was informed by his insurance company a new rx was needed. He was informed per Sandford Craze okay,  rx was sent to [facility] per request.

## 2011-02-08 ENCOUNTER — Telehealth: Payer: Self-pay

## 2011-02-08 NOTE — Telephone Encounter (Signed)
Patient informed. 

## 2011-02-08 NOTE — Telephone Encounter (Signed)
I would not recommend that she take aleve because this is not good for her kidneys.  I recommend that she take tylenol as needed.

## 2011-02-08 NOTE — Telephone Encounter (Signed)
Patients spouse called stating pts lower back is hurting and she would like to take some Aleve? Pt just wants to make sure it won't conflict with any of her other medication?  Please advise? Call back number [phone]

## 2011-02-24 ENCOUNTER — Telehealth: Payer: Self-pay | Admitting: *Deleted

## 2011-02-24 NOTE — Telephone Encounter (Signed)
Pt's husband scheduled appt with Melissa for 02/28/11 at 10am.

## 2011-02-24 NOTE — Telephone Encounter (Signed)
Received message from pt's husband stating that pt has been down in her back x 10 days. Her chiropractor ordered and xray and he is wanting to drop the xrays off for [place] to look at around lunch and advise if pt needs to be seen in the office. Attempted to reach pt and left message on machine. Was pt seen by chiropractor? Per Efraim Kaufmann, she needs to be seen regardless of xrays.

## 2011-02-28 ENCOUNTER — Encounter: Payer: Self-pay | Admitting: Family

## 2011-02-28 ENCOUNTER — Ambulatory Visit (INDEPENDENT_AMBULATORY_CARE_PROVIDER_SITE_OTHER): Payer: Medicare Other | Admitting: Family

## 2011-02-28 ENCOUNTER — Ambulatory Visit (HOSPITAL_BASED_OUTPATIENT_CLINIC_OR_DEPARTMENT_OTHER)
Admission: RE | Admit: 2011-02-28 | Discharge: 2011-02-28 | Disposition: A | Discharge status: Home / Self Care | Payer: Medicare Other | Source: Ambulatory Visit | Attending: Family | Admitting: Family

## 2011-02-28 VITALS — BP 138/60 | HR 66 | Temp 97.6°F | Resp 18 | Ht 64.0 in | Wt 96.0 lb

## 2011-02-28 DIAGNOSIS — M25551 Pain in right hip: Secondary | ICD-10-CM

## 2011-02-28 DIAGNOSIS — M25559 Pain in unspecified hip: Secondary | ICD-10-CM

## 2011-02-28 DIAGNOSIS — R413 Other amnesia: Secondary | ICD-10-CM

## 2011-02-28 DIAGNOSIS — R630 Anorexia: Secondary | ICD-10-CM

## 2011-02-28 DIAGNOSIS — N289 Disorder of kidney and ureter, unspecified: Secondary | ICD-10-CM

## 2011-02-28 NOTE — Patient Instructions (Signed)
Please restart Megace for your appetite. Complete x-ray on the first floor this morning. You may use Tylenol 650mg  every 6-8 hours as needed for hip pain. Follow up in 1 month, sooner if symptoms worsen.

## 2011-02-28 NOTE — Progress Notes (Signed)
Subjective:    Patient ID: Leah Harper, female    DOB: August 13, 1919, 77 y.o.   MRN: [id number]  HPI  [name] is an 77 yr old female who presents today primarily to discuss her right hip/low back pain.  She has seen a chiropractor 4 times without improvement in her symptoms.  Symptoms started 4-6 weeks ago. She reports that wearing a girdle seems to help some with the pain. She denies known hx of injury.  Pain is worse with weight bearing.  Denies RLE weakness. Pain is non-radiating.  Husband notes that she had similar problems about 3 months ago which resolved with chiropractic.    Weight loss- pt admits to stopping megace.  "I didn't think that I needed it."  HTN- she reports that she did not start amlodipine as instructed by Dr. Briant Cedar.  Review of Systems See HPI  Past Medical History  Diagnosis Date  . Hyperlipidemia   . Hypertension   . AF (atrial fibrillation)   . Hyperthyroidism     remotely in 1941  . Normocytic anemia     2002  . S/P TAH-BSO (total abdominal hysterectomy and bilateral salpingo-oophorectomy)   . Allergy   . Arrhythmia   . Anxiety     History   Social History  . Marital Status: Married    Spouse Name: [name]    Number of Children: 3  . Years of Education: N/A   Occupational History  . retired    Social History Main Topics  . Smoking status: Never Smoker   . Smokeless tobacco: Never Used  . Alcohol Use: No  . Drug Use: Not on file  . Sexually Active: Not on file   Other Topics Concern  . Not on file   Social History Narrative   RetiredMarried -3 children Tobacco abuse-noAlcohol use-no  Regular exercise-no    Past Surgical History  Procedure Date  . Tonsillectomy   . Total abdominal hysterectomy w/ bilateral salpingoophorectomy 1986  . Bilateral salpingoophorectomy   . Bladder suspension     bladder tack with TAHBSO    Family History  Problem Relation Age of Onset  . Heart disease Father   . Heart disease Brother    heart attack  . Heart disease Brother     No Known Allergies  Current Outpatient Prescriptions on File Prior to Visit  Medication Sig Dispense Refill  . aspirin 81 MG tablet Take 81 mg by mouth daily.        Marland Kitchen ezetimibe (ZETIA) 10 MG tablet Take 1 tablet (10 mg total) by mouth daily.  90 tablet  1  . metoprolol (LOPRESSOR) 50 MG tablet Take 25 mg by mouth. 1/2 tablet by mouth in the morning       . amLODipine (NORVASC) 2.5 MG tablet Take 2.5 mg by mouth daily.          BP 138/60  Pulse 66  Temp(Src) 97.6 F (36.4 C) (Oral)  Resp 18  Ht 5\' 4"  (1.626 m)  Wt 96 lb (43.545 kg)  BMI 16.48 kg/m2       Objective:   Physical Exam  Constitutional:       Thin appearing elderly white female in NAD. She has an odor of fecal incontinence.  Cardiovascular: Normal rate and regular rhythm.   Pulmonary/Chest: Effort normal and breath sounds normal. No respiratory distress. She has no wheezes. She has no rales. She exhibits no tenderness.  Musculoskeletal: She exhibits no edema.  Psychiatric: She has  a normal mood and affect.       Short term memory seems impaired.            Assessment & Plan:

## 2011-03-06 DIAGNOSIS — M25551 Pain in right hip: Secondary | ICD-10-CM | POA: Insufficient documentation

## 2011-03-06 NOTE — Assessment & Plan Note (Signed)
Plain film is negative for hip fracture. Will order MRI of the lumbar spine to further evaluate.

## 2011-03-06 NOTE — Assessment & Plan Note (Signed)
This is being folllowed by Dr. Briant Cedar.  Her creatinine was 1.51 on 8/31 at Dr. Edd Arbour office.

## 2011-03-06 NOTE — Assessment & Plan Note (Signed)
She has lost two pounds since her last visit.  I reinforced with the patient and her husband that the megace is important to her.  We did see her weight stabilize when she was taking regularly.

## 2011-03-06 NOTE — Assessment & Plan Note (Signed)
She has declined treatment for this.  Unfortunately, I think that her dementia is her primary issue at this time.  Especially as it relates to her weight loss and non-compliance.  Her husband continues to work with her and encourage her, but pt makes it difficult for him.

## 2011-03-07 ENCOUNTER — Ambulatory Visit: Payer: Medicare Other | Admitting: Family

## 2011-03-08 ENCOUNTER — Other Ambulatory Visit (HOSPITAL_BASED_OUTPATIENT_CLINIC_OR_DEPARTMENT_OTHER): Payer: Medicare Other

## 2011-03-10 ENCOUNTER — Encounter: Payer: Self-pay | Admitting: Family

## 2011-03-10 ENCOUNTER — Ambulatory Visit (INDEPENDENT_AMBULATORY_CARE_PROVIDER_SITE_OTHER): Payer: Medicare Other | Admitting: Family

## 2011-03-10 ENCOUNTER — Telehealth: Payer: Self-pay | Admitting: Family

## 2011-03-10 ENCOUNTER — Observation Stay (HOSPITAL_COMMUNITY)
Admission: AD | Admit: 2011-03-10 | Discharge: 2011-03-12 | DRG: 948 | Disposition: A | Discharge status: Home / Self Care | Payer: Medicare Other | Source: Ambulatory Visit | Attending: Family Medicine | Admitting: Family Medicine

## 2011-03-10 DIAGNOSIS — R413 Other amnesia: Secondary | ICD-10-CM

## 2011-03-10 DIAGNOSIS — R630 Anorexia: Secondary | ICD-10-CM

## 2011-03-10 DIAGNOSIS — N390 Urinary tract infection, site not specified: Secondary | ICD-10-CM

## 2011-03-10 DIAGNOSIS — I10 Essential (primary) hypertension: Secondary | ICD-10-CM

## 2011-03-10 LAB — COMPREHENSIVE METABOLIC PANEL
ALT: 10 U/L (ref 0–35)
AST: 21 U/L (ref 0–37)
CO2: 28 mEq/L (ref 19–32)
Chloride: 96 mEq/L (ref 96–112)
Creatinine, Ser: 1.47 mg/dL — ABNORMAL HIGH (ref 0.50–1.10)
GFR calc non Af Amer: 34 mL/min — ABNORMAL LOW (ref >60)
Total Bilirubin: 0.4 mg/dL (ref 0.3–1.2)

## 2011-03-10 LAB — CBC
Hemoglobin: 12.2 g/dL (ref 12.0–15.0)
MCV: 91.3 fL (ref 78.0–100.0)
Platelets: 206 10*3/uL (ref 150–400)
RBC: 3.78 MIL/uL — ABNORMAL LOW (ref 3.87–5.11)
WBC: 5.7 10*3/uL (ref 4.0–10.5)

## 2011-03-10 LAB — POCT URINALYSIS DIPSTICK
Bilirubin, UA: NEGATIVE
Glucose, UA: NEGATIVE
Ketones, UA: NEGATIVE

## 2011-03-10 LAB — DIFFERENTIAL
Eosinophils Absolute: 0.1 10*3/uL (ref 0.0–0.7)
Lymphocytes Relative: 36 % (ref 12–46)
Lymphs Abs: 2 10*3/uL (ref 0.7–4.0)
Neutro Abs: 3.1 10*3/uL (ref 1.7–7.7)
Neutrophils Relative %: 54 % (ref 43–77)

## 2011-03-10 NOTE — Telephone Encounter (Signed)
Notified pt's husband that she will be going to room 5030 at [facility] and she should register in admitting first. Will cancel MRI scheduled for Tuesday. Advised pt to keep f/u on Wednesday with Melissa and we can r/s if she is still in the hospital.

## 2011-03-10 NOTE — Assessment & Plan Note (Signed)
Deteriorated, pt has stopped taking her norvasc.

## 2011-03-10 NOTE — Patient Instructions (Signed)
Go directly to [facility] admissions. You are being admitted to [facility]. Follow back up in the office after you are discharged from the hospital.

## 2011-03-10 NOTE — Assessment & Plan Note (Signed)
Pt completed ampicillin rx.  Today's urine dip shows moderate blood and moderate leukocytes.  Due to progressive weakness, anorexia and dehydration, will admit to [facility] for further evaluation.

## 2011-03-10 NOTE — Progress Notes (Signed)
Subjective:    Patient ID: Leah Harper, female    DOB: 10-25-1919, 77 y.o.   MRN: [id number]  HPI  Leah Harper is an 77 yr old female with history of dementia and CRI (followed by Dr. Briant Cedar) who presents today with her husband due to progressive weakness.  Weakness has been going on for about 1 month but recently has worsened.  Husband reports that yesterday she stayed in the bed all day.  He was unable to get her up despite her husband's encouragement.  Associated symptoms include anorexia and weight loss.  She just completed a round of ampicillin prescribed by Dr. Briant Cedar for a UTI.  She is refusing to take her megace.   Review of Systems See HPI  Past Medical History  Diagnosis Date  . Hyperlipidemia   . Hypertension   . AF (atrial fibrillation)   . Hyperthyroidism     remotely in 1941  . Normocytic anemia     2002  . S/P TAH-BSO (total abdominal hysterectomy and bilateral salpingo-oophorectomy)   . Allergy   . Arrhythmia   . Anxiety     History   Social History  . Marital Status: Married    Spouse Name: [name]    Number of Children: 3  . Years of Education: N/A   Occupational History  . retired    Social History Main Topics  . Smoking status: Never Smoker   . Smokeless tobacco: Never Used  . Alcohol Use: No  . Drug Use: Not on file  . Sexually Active: Not on file   Other Topics Concern  . Not on file   Social History Narrative   RetiredMarried -3 children Tobacco abuse-noAlcohol use-no  Regular exercise-no    Past Surgical History  Procedure Date  . Tonsillectomy   . Total abdominal hysterectomy w/ bilateral salpingoophorectomy 1986  . Bilateral salpingoophorectomy   . Bladder suspension     bladder tack with TAHBSO    Family History  Problem Relation Age of Onset  . Heart disease Father   . Heart disease Brother     heart attack  . Heart disease Brother     No Known Allergies  Current Outpatient Prescriptions on File Prior to Visit   Medication Sig Dispense Refill  . aspirin 81 MG tablet Take 81 mg by mouth daily.        Marland Kitchen ENSURE (ENSURE) Take 237 mLs by mouth 2 (two) times daily between meals.        Marland Kitchen ezetimibe (ZETIA) 10 MG tablet Take 1 tablet (10 mg total) by mouth daily.  90 tablet  1  . metoprolol (LOPRESSOR) 50 MG tablet Take 25 mg by mouth. 1/2 tablet by mouth in the morning       . amLODipine (NORVASC) 2.5 MG tablet Take 2.5 mg by mouth daily.          BP 144/64  Pulse 102  Temp(Src) 98 F (36.7 C) (Oral)  Resp 16  Ht 5\' 4"  (1.626 m)  Wt 95 lb 1.9 oz (43.146 kg)  BMI 16.33 kg/m2       Objective:   Physical Exam  Constitutional: She appears well-developed.       Pale, weak appearing cachectic female in NAD.  HENT:  Head: Normocephalic and atraumatic.  Cardiovascular: Normal rate and regular rhythm.   No murmur heard. Pulmonary/Chest: Effort normal and breath sounds normal.  Abdominal: Soft. Bowel sounds are normal. She exhibits no distension. There is no tenderness. There  is no rebound and no guarding.  Psychiatric: Her behavior is normal. Thought content normal. Cognition and memory are impaired.       Flat affect,           Assessment & Plan:

## 2011-03-10 NOTE — Assessment & Plan Note (Signed)
This is an ongoing problem, likely exacerbated by her underlying dementia and possibly some depression.  Husband reports that pt recently lost her sister whom she was very close to.  She has declined treatment for depression.

## 2011-03-10 NOTE — Telephone Encounter (Signed)
Spoke with pt's husband.  We are still waiting for a bed to open [facility] at [facility].  If no bed available by 3PM, will plan to send pt through the ED.  He is agreeable to the plan.  Encouraged PO intake at home in the meantime.

## 2011-03-10 NOTE — Assessment & Plan Note (Signed)
She continues to refuse treatment.

## 2011-03-11 ENCOUNTER — Inpatient Hospital Stay (HOSPITAL_COMMUNITY): Payer: Medicare Other

## 2011-03-11 LAB — BASIC METABOLIC PANEL
BUN: 20 mg/dL (ref 6–23)
CO2: 30 mEq/L (ref 19–32)
Chloride: 104 mEq/L (ref 96–112)
Creatinine, Ser: 1.42 mg/dL — ABNORMAL HIGH (ref 0.50–1.10)

## 2011-03-11 LAB — URINALYSIS, ROUTINE W REFLEX MICROSCOPIC
Glucose, UA: NEGATIVE mg/dL
Ketones, ur: NEGATIVE mg/dL
Protein, ur: NEGATIVE mg/dL

## 2011-03-11 LAB — VITAMIN B12: Vitamin B-12: 745 pg/mL (ref 211–911)

## 2011-03-11 LAB — PREALBUMIN: Prealbumin: 19.5 mg/dL (ref 17.0–34.0)

## 2011-03-11 LAB — URINE MICROSCOPIC-ADD ON

## 2011-03-11 LAB — RPR: RPR Ser Ql: NONREACTIVE

## 2011-03-11 LAB — TSH: TSH: 1.761 u[IU]/mL (ref 0.350–4.500)

## 2011-03-11 LAB — TROPONIN I: Troponin I: <0.3 ng/mL (ref <0.30)

## 2011-03-12 LAB — CK TOTAL AND CKMB (NOT AT ARMC)
Relative Index: INVALID (ref 0.0–2.5)
Total CK: 32 U/L (ref 7–177)

## 2011-03-12 LAB — URINE CULTURE: Colony Count: >=100000

## 2011-03-12 LAB — CBC
HCT: 30.7 % — ABNORMAL LOW (ref 36.0–46.0)
MCV: 92.2 fL (ref 78.0–100.0)
Platelets: 184 10*3/uL (ref 150–400)
RBC: 3.33 MIL/uL — ABNORMAL LOW (ref 3.87–5.11)
WBC: 5.2 10*3/uL (ref 4.0–10.5)

## 2011-03-12 LAB — BASIC METABOLIC PANEL
CO2: 26 mEq/L (ref 19–32)
Chloride: 106 mEq/L (ref 96–112)
Creatinine, Ser: 1.31 mg/dL — ABNORMAL HIGH (ref 0.50–1.10)
Potassium: 3.7 mEq/L (ref 3.5–5.1)

## 2011-03-12 LAB — TROPONIN I: Troponin I: <0.3 ng/mL (ref <0.30)

## 2011-03-14 ENCOUNTER — Other Ambulatory Visit (HOSPITAL_BASED_OUTPATIENT_CLINIC_OR_DEPARTMENT_OTHER): Payer: Medicare Other

## 2011-03-14 ENCOUNTER — Ambulatory Visit: Payer: Medicare Other | Admitting: Family

## 2011-03-15 ENCOUNTER — Encounter: Payer: Self-pay | Admitting: Family

## 2011-03-15 ENCOUNTER — Other Ambulatory Visit: Payer: Self-pay | Admitting: Family

## 2011-03-15 ENCOUNTER — Ambulatory Visit (INDEPENDENT_AMBULATORY_CARE_PROVIDER_SITE_OTHER): Payer: Medicare Other | Admitting: Family

## 2011-03-15 VITALS — BP 110/60 | HR 60 | Temp 97.8°F | Resp 16 | Wt 95.0 lb

## 2011-03-15 DIAGNOSIS — L989 Disorder of the skin and subcutaneous tissue, unspecified: Secondary | ICD-10-CM

## 2011-03-15 DIAGNOSIS — I10 Essential (primary) hypertension: Secondary | ICD-10-CM

## 2011-03-15 DIAGNOSIS — M25559 Pain in unspecified hip: Secondary | ICD-10-CM

## 2011-03-15 DIAGNOSIS — R630 Anorexia: Secondary | ICD-10-CM

## 2011-03-15 DIAGNOSIS — F341 Dysthymic disorder: Secondary | ICD-10-CM

## 2011-03-15 DIAGNOSIS — M25551 Pain in right hip: Secondary | ICD-10-CM

## 2011-03-15 DIAGNOSIS — D649 Anemia, unspecified: Secondary | ICD-10-CM

## 2011-03-15 NOTE — Progress Notes (Addendum)
Subjective:    Patient ID: Leah Harper, female    DOB: [birth year], 77 y.o.   MRN: [id number]  HPI  [name] is an 77 yr old female who presents today for hospital follow up.  She was admitted last weekend for ? UTI and weakness.  Records were reviewed from hospitalization.   1) HTN- was on amlodipine and metoprolol.  She was changed to toprol xl 50.  She is tolerating this regimen without difficulty.  2) Weight loss- Pt was started on amlodipine. Slight improvement in appetite.  Trouble getting up and out of bedin the morning- unchanged.   3) Anxiety/depression- was started on xanax- Husband has not given it to her.  Wants it on hand, "just in case." He reports that he is still having trouble getting her out of the bed in the AM.  She goes to bed late at 11:30PM usually, and wants to sleep until 11 AM.  Review of Systems See HPI  Past Medical History  Diagnosis Date  . Hyperlipidemia   . Hypertension   . AF (atrial fibrillation)   . Hyperthyroidism     remotely in 1941  . Normocytic anemia     2002  . S/P TAH-BSO (total abdominal hysterectomy and bilateral salpingo-oophorectomy)   . Allergy   . Arrhythmia   . Anxiety     History   Social History  . Marital Status: Married    Spouse Name: [name]    Number of Children: 3  . Years of Education: N/A   Occupational History  . retired    Social History Main Topics  . Smoking status: Never Smoker   . Smokeless tobacco: Never Used  . Alcohol Use: No  . Drug Use: Not on file  . Sexually Active: Not on file   Other Topics Concern  . Not on file   Social History Narrative   RetiredMarried -3 children Tobacco abuse-noAlcohol use-no  Regular exercise-no    Past Surgical History  Procedure Date  . Tonsillectomy   . Total abdominal hysterectomy w/ bilateral salpingoophorectomy 1986  . Bilateral salpingoophorectomy   . Bladder suspension     bladder tack with TAHBSO    Family History  Problem Relation Age  of Onset  . Heart disease Father   . Heart disease Brother     heart attack  . Heart disease Brother     No Known Allergies  Current Outpatient Prescriptions on File Prior to Visit  Medication Sig Dispense Refill  . amLODipine (NORVASC) 2.5 MG tablet Take 2.5 mg by mouth daily.        Marland Kitchen aspirin 81 MG tablet Take 81 mg by mouth daily.        Marland Kitchen ENSURE (ENSURE) Take 237 mLs by mouth 2 (two) times daily between meals.        Marland Kitchen ezetimibe (ZETIA) 10 MG tablet Take 1 tablet (10 mg total) by mouth daily.  90 tablet  1  . metoprolol (LOPRESSOR) 50 MG tablet Take 50 mg by mouth daily.         BP 110/60  Pulse 60  Temp(Src) 97.8 F (36.6 C) (Oral)  Resp 16  Wt 95 lb (43.092 kg)       Objective:   Physical Exam  Constitutional:       Thin elderly white female in NAD  HENT:  Head: Normocephalic and atraumatic.  Neck: Normal range of motion. Neck supple.  Cardiovascular: Normal rate and regular rhythm.   No  murmur heard. Pulmonary/Chest: Effort normal and breath sounds normal. No respiratory distress. She has no wheezes. She has no rales.  Abdominal: Soft. Bowel sounds are normal. She exhibits no distension. There is no tenderness. There is no rebound and no guarding.  Skin:       Raised, rough whitish lesion noted overlying left scapula, approx 1cm diameter.  Psychiatric: She has a normal mood and affect. Cognition and memory are impaired.          Assessment & Plan:

## 2011-03-15 NOTE — Assessment & Plan Note (Signed)
This is improved per husband, they wish to cancel MRI at this time.

## 2011-03-15 NOTE — Assessment & Plan Note (Signed)
Appears stable on current regimen, continue same.

## 2011-03-15 NOTE — Progress Notes (Signed)
  Subjective:    Patient ID: Leah Harper, female    DOB: Aug 17, 1919, 77 y.o.   MRN: [id number]  HPI    Review of Systems     Objective:   Physical Exam        Assessment & Plan:

## 2011-03-15 NOTE — Assessment & Plan Note (Signed)
Will check iron level.  I suspect her anemia is due to chronic kidney disease however.

## 2011-03-15 NOTE — Assessment & Plan Note (Signed)
She is now on remeron which will hopefully help with her mood and appetite.  She seems agreeable to take this.  I encouraged sleep hygiene with the patient and the husband.

## 2011-03-15 NOTE — Patient Instructions (Signed)
You will be contacted about your referral to dermatology. Complete blood work on the first floor.  Follow up in 1 month, sooner if problems or concerns.

## 2011-03-15 NOTE — Assessment & Plan Note (Signed)
Weight is stable since last visit. She is instructed to continue to eat regular meals.

## 2011-03-15 NOTE — Assessment & Plan Note (Signed)
Will refer to dermatology for further evaluation.  

## 2011-03-16 LAB — LIPID PANEL
HDL: 58 mg/dL (ref >39)
Triglycerides: 125 mg/dL (ref <150)

## 2011-03-17 LAB — CULTURE, BLOOD (ROUTINE X 2)
Culture  Setup Time: 201209150555
Culture  Setup Time: 201209150555
Culture: NO GROWTH

## 2011-03-20 ENCOUNTER — Telehealth: Payer: Self-pay | Admitting: Family

## 2011-03-20 NOTE — Telephone Encounter (Signed)
Patients husband called and wants [place] to refer patient to [facility], Dr. Karlyn Agee or Dr. Letta Pate as soon as possible. This is for place on patients back that Melissa saw about last week.

## 2011-03-20 NOTE — Telephone Encounter (Signed)
Leah Harper, could you pls check on status of this referral and let husband know? thanks

## 2011-03-20 NOTE — Telephone Encounter (Signed)
Pt  Is scheduled to see Tollie Eth NP @  [facility],  Oct 2   I have already spoke with pt.  Dr Yetta Barre and that group is schedule out to December 28th

## 2011-03-28 ENCOUNTER — Ambulatory Visit: Payer: Medicare Other | Admitting: Family

## 2011-04-04 NOTE — H&P (Signed)
NAMEHUE, STEVESON NO.:  [id number]  MEDICAL RECORD NO.:  [id number]  LOCATION:  5030                         FACILITY:  [facility]  PHYSICIAN:  Mauro Kaufmann, MD         DATE OF BIRTH:  10/14/1919  DATE OF ADMISSION:  03/10/2011 DATE OF DISCHARGE:                             HISTORY & PHYSICAL   PRIMARY CARE:  [facility].  CHIEF COMPLAINT:  Generalized weakness.  HISTORY OF PRESENT ILLNESS:  This is an 77 year old female who has a history of dementia, chronic kidney disease, was sent to the hospital from primary care office because of the recurrent UTIs, generalized weakness.  The patient at this time is alert, oriented, but due to the hearing loss, is not able to provide much of the history.  The patient's family provides most of the history and as per husband the patient has been gradually getting weaker over the past 6-8 weeks.  She has a tendency to stop eating and this is what has happened as per the husband, which she has stopped eating and she also was diagnosed with a UTI, which was treated with ampicillin and when she did check the urine dipstick in the office again, she was found to have leukocytes and blood in the urine, so she sent to the hospital for further evaluation.  As per the husband, the patient has very poor appetite and has not been eating and drinking as she should be.  The patient denies any pain. There is no chest pain or shortness of breath.  No nausea, vomiting, or diarrhea.  The patient does have occasional constipation, but no abdominal pain.  No history of any passing out spells.  No history of blurred vision.  PAST MEDICAL HISTORY:  Significant for, 1. Hypertension. 2. Hyperlipidemia. 3. Atrial fibrillation. 4. Hyperthyroidism. 5. Normocytic anemia. 6. Status post total abdominal hysterectomy with bilateral salpingo-     oophorectomy. 7. Anxiety disorder.  SOCIAL HISTORY:  The patient is married.  There is no  history of alcohol abuse.  No history of illicit drug abuse.  PAST SURGICAL HISTORY: 1. The patient had tonsillectomy. 2. Total abdominal hysterectomy with bilateral salpingo-oophorectomy. 3. Bladder suspension, bladder tack with TAH-BSO.  FAMILY HISTORY:  The patient's father had heart disease and brother also had heart disease.  CURRENT MEDICATIONS: 1. Aspirin 81 mg p.o. daily. 2. Ensure 237 mL by mouth 2 times daily between meals. 3. Zetia 10 mg p.o. daily. 4. Metoprolol 50 mg half tablet p.o. daily in morning. 5. Amlodipine 2.5 mg p.o. daily.  REVIEW OF SYSTEMS:  As in the HPI.  In addition to that neuro, there is no history of stroke or seizures in the past.  No history of cancer.  PHYSICAL EXAMINATION:  VITAL SIGNS:  The patient's blood pressure is 132/78, respirations 22, pulse 89, temperature is 98.7. HEENT:  Head is atraumatic, normocephalic.  Eyes, extraocular muscles intact.  Oral mucosa is pink and moist. NECK:  Supple.  No lymph nodes palpable at this time. CHEST:  Clear to auscultation bilaterally.  No wheezing.  No crackles. Auscultation. HEART:  S1 and S2 with regular rate and rhythm. ABDOMEN:  Soft,  nontender.  No organomegaly. EXTREMITIES:  No cyanosis, no clubbing, no edema. NEUROLOGICAL:  The patient is alert and oriented x3. NEURO:  The patient is able to move all the extremities.  DTRs are 2+ bilaterally.  No focal deficit noted at this time.  LABORATORY DATA:  Labs are pending at this time.  Imaging studies are pending at this time.  ASSESSMENT: 1. Generalized weakness. 2. Questionable urinary tract infection. 3. Hypertension. 4. Hyperlipidemia. 5. Anorexia. 6. Mild dementia.  PLAN: 1. Generalized weakness.  The patient has been having these symptoms     for long time.  We are going to order a workup including the basic     labs, CBC, CMET, magnesium level.  Also check a TSH and B12 level.     The patient has had workup in the past including  the CAT scan of     the abdomen and pelvis, which was done in April 2012 for the same     anorexia and weight loss, but at this time there was no cause     identified for the anorexia.  The patient also has been started on     Megace, which she has refused to take.  The patient's husband think     that the patient may have underlying depression, but the family     does not want any psych consultation at this time, but they agreed     to start a trial of Remeron.  We should take care of the depression     as well as help her with appetite, so I am going to start her on     Remeron 7.5 mg p.o. q.p.m.  We will also check the patient's EKG,     urinalysis, urine culture, blood cultures x2 as the patient was     treated with ampicillin for the UTI.  We will also check the     patient's prealbumin level. 2. Hypertension.  The patient will be started on metoprolol,     amlodipine. 3. Hyperlipidemia.  We will continue Zetia. 4. AFib.  The patient will be on metoprolol and aspirin.  CODE STATUS:  The patient is currently DNR.  Rest of the plan as per further workup.  Also, please make a note that the patient also on the examination of the back she has lesion noted in the left upper back. The erythema is noted around the mole, which is mildly tender to palpation and also itching.  I confirmed with the patient's husband, who says that it just happened due to the bra strap a week ago and she did not have this lesion before.  Any way, the patient will need biopsy of the mole, which can be done as an outpatient to make sure that the patient does not have underlying melanoma.     Mauro Kaufmann, MD     GL/MEDQ  D:  03/10/2011  T:  03/10/2011  Job:  [id number]  Electronically Signed by Sibyl Parr Aarib Pulido  on 04/04/2011 09:31:41 AM

## 2011-04-04 NOTE — Discharge Summary (Signed)
NAMENEIRA, BENTSEN               ACCOUNT NO.:  [id number]  MEDICAL RECORD NO.:  [id number]  LOCATION:  5030                         FACILITY:  [facility]  PHYSICIAN:  Mauro Kaufmann, MD         DATE OF BIRTH:  [birth year]  DATE OF ADMISSION:  03/10/2011 DATE OF DISCHARGE:  03/12/2011                              DISCHARGE SUMMARY   ADMISSION DIAGNOSES: 1. Generalized weakness. 2. Questionable urinary tract infection. 3. Hypertension. 4. Hyperlipidemia. 5. Anorexia. 6. Mild dementia.  DISCHARGE DIAGNOSES: 1. Generalized weakness, resolved. 2. Hypertension, blood pressure medications adjusted. 3. Hyperlipidemia. 4. Anorexia, resolved.  The patient currently on Remeron. 5. Mild dementia, stable.  TESTS PERFORMED DURING THE HOSPITAL STAY:  Chest x-ray on March 11, 2011, shows no evidence of acute cardiopulmonary process.  PERTINENT LABS IN THE HOSPITAL:  Urine culture, which only grew 100,000 colonies of multiple bacterial morphotypes, but no other bacteria. Culture x2 have been negative.  Cardiac enzymes x3 negative.  The patient does have elevated creatinine of 1.31, which is with underlying chronic kidney disease.  LABS ON DAY OF DISCHARGE:  Sodium 137, potassium 3.7, chloride 106, CO2 26, BUN 20, creatinine 1.31.  WBC 5.2, hemoglobin is 10.3, hematocrit 3.7, platelet count of 184.  The patient's B12 level is 145.  TSH is 1.761.  Prealbumin is 19.5.  BRIEF HISTORY AND PHYSICAL:  This is an 77 year old female with a history of chronic kidney disease, dementia who was sent to the hospital from the primary care office because of the recurrent UTIs, generalized weakness, and anorexia, which has been going on for the past 6-8 weeks. The patient does have a history of mild dementia, but she is not on any medication for dementia.  The patient was admitted for the generalized weakness and a workup was done to check for the cause for anorexia including the TSH and B12 level for the  dementia workup, liver function tests, magnesium levels.  BRIEF HOSPITAL COURSE: 1. Generalized weakness.  The patient was put on Remeron to stabilize     the appetite as the patient has questionable history of underlying     depression.  Family at this time does not want any psych to be     involved, so no psychiatric consultation was obtained, but the     patient had dramatic improvement with Remeron and she was able to     sleep well and also had marked increase in appetite.  The patient     has been eating well while in the hospital.  At this time, the     generalized weakness has resolved.  The patient has been walking in     the room without any complaints.  So I am going to continue the     patient on Remeron 7.5 mg p.o. daily.  The patient's dose has to be     monitored over the next few weeks and may have to be adjusted.     Also make sure that we check the patient's QTC interval on regular     basis by doing an EKG on this patient to make sure that QTC is not  prolonged.  Any ways the patient has improved so much with Remeron     that the family wants to continue with Remeron and will be given a     prescription for it. 2. Hypertension.  The patient was on metoprolol 25 mg p.o. in the     morning along with amlodipine 2.5 mg p.o. daily, which was causing     wide fluctuation in the blood pressure and so we have changed the     dose of metoprolol to Toprol-XL 50 mg p.o. daily and at this time     the patient's blood pressure is 120/41.  This morning the patient's     blood pressure went up to 198/78, which caused her to have     throbbing headache in the left ear.  At this time after she got     amlodipine and Toprol-XL, the blood pressure has improved, the     patient has no headache at this time and wants to go home.  The     patient has been afebrile during the hospital stay and is stable     for discharge. 3. Anxiety.  The patient did have an anxiety episode in the  hospital.     The patient's family wants to use Xanax on a p.r.n. basis, so she     will be given Xanax 0.25 mg p.o. twice daily as needed for anxiety,     which can be further continued as per discretion of primary care     provider.  MEDICATIONS ON DISCHARGE: 1. Amlodipine 2.5 mg p.o. daily. 2. Toprol-XL 50 mg p.o. daily. 3. Mirtazapine 7.5 mg p.o. daily at bedtime. 4. Xanax 0.25 mg p.o. twice daily as needed. 5. Aspirin 81 mg 1 tablet p.o. daily. 6. Vanilla Ensure supplement 237 mL p.o. three times a day. 7. Zetia 10 mg 1 tablet p.o. daily.     Mauro Kaufmann, MD     GL/MEDQ  D:  03/12/2011  T:  03/12/2011  Job:  [id number]  cc:   Barbette Hair. Artist Pais, DO Peter C. Eden Emms, MD, [facility] Sandford Craze, NP  Electronically Signed by Mauro Kaufmann  on 04/04/2011 09:31:32 AM

## 2011-04-10 ENCOUNTER — Encounter: Payer: Self-pay | Admitting: Family

## 2011-04-19 ENCOUNTER — Ambulatory Visit: Payer: Medicare Other | Admitting: Family

## 2011-04-25 ENCOUNTER — Encounter: Payer: Self-pay | Admitting: Family

## 2011-04-25 ENCOUNTER — Ambulatory Visit (INDEPENDENT_AMBULATORY_CARE_PROVIDER_SITE_OTHER): Payer: BC Managed Care – PPO | Admitting: Family

## 2011-04-25 VITALS — BP 150/64 | HR 60 | Temp 97.8°F | Resp 16 | Wt 95.1 lb

## 2011-04-25 DIAGNOSIS — F320 Major depressive disorder, single episode, mild: Secondary | ICD-10-CM

## 2011-04-25 DIAGNOSIS — I10 Essential (primary) hypertension: Secondary | ICD-10-CM

## 2011-04-25 DIAGNOSIS — R630 Anorexia: Secondary | ICD-10-CM

## 2011-04-25 DIAGNOSIS — D649 Anemia, unspecified: Secondary | ICD-10-CM

## 2011-04-25 DIAGNOSIS — F329 Major depressive disorder, single episode, unspecified: Secondary | ICD-10-CM

## 2011-04-25 LAB — CBC WITH DIFFERENTIAL/PLATELET
Basophils Absolute: 0 10*3/uL (ref 0.0–0.1)
Eosinophils Relative: 4 % (ref 0–5)
HCT: 38.3 % (ref 36.0–46.0)
Hemoglobin: 12.5 g/dL (ref 12.0–15.0)
Lymphocytes Relative: 27 % (ref 12–46)
MCV: 95 fL (ref 78.0–100.0)
Monocytes Absolute: 0.4 10*3/uL (ref 0.1–1.0)
Monocytes Relative: 7 % (ref 3–12)
RDW: 13.6 % (ref 11.5–15.5)
WBC: 5.4 10*3/uL (ref 4.0–10.5)

## 2011-04-25 LAB — IRON AND TIBC
%SAT: 40 % (ref 20–55)
TIBC: 282 ug/dL (ref 250–470)

## 2011-04-25 NOTE — Assessment & Plan Note (Signed)
Weight is stable. Husband to keep a close watch on this. Continue remeron.

## 2011-04-25 NOTE — Assessment & Plan Note (Signed)
Remeron seems to be helping some.  It is also helping her appetite.

## 2011-04-25 NOTE — Assessment & Plan Note (Signed)
BP Readings from Last 3 Encounters:  04/25/11 150/64  03/15/11 110/60  03/10/11 144/64  BP up today.  Husband wishes to defer med changes to Dr. Eden Emms whom she is overdue to follow up with.  I recommended that he contact their office for follow up apt.

## 2011-04-25 NOTE — Patient Instructions (Addendum)
Please call Dr. Fabio Bering office to schedule a follow up appointment. Please complete your lab work prior to leaving today. Follow up in 3 months, sooner if you have further weight loss.

## 2011-04-25 NOTE — Progress Notes (Signed)
Subjective:    Patient ID: Leah Harper, female    DOB: 06-03-1920, 77 y.o.   MRN: [id number]  HPI  Ms.  Neiss is an 77 yr old female who presents today for follow up.    Weight loss-  Husband tells me that she is not taking megace but that she continues to take remeron at bedtime.  Her weight is unchanged since last visit.  She reports that her appetite is fair.    Mood is improving.  She notes that she goes to bed at 11PM.  Gets up between 8 and 9:30.    Husband reports that the the skin lesion resolved on her back and he canceled the dermatology apt.    Anemia-  She denies blood in her stool.  She does report dark BM's.   Review of Systems See HPI  Past Medical History  Diagnosis Date  . Hyperlipidemia   . Hypertension   . AF (atrial fibrillation)   . Hyperthyroidism     remotely in 1941  . Normocytic anemia     2002  . S/P TAH-BSO (total abdominal hysterectomy and bilateral salpingo-oophorectomy)   . Allergy   . Arrhythmia   . Anxiety     History   Social History  . Marital Status: Married    Spouse Name: [name]    Number of Children: 3  . Years of Education: N/A   Occupational History  . retired    Social History Main Topics  . Smoking status: Never Smoker   . Smokeless tobacco: Never Used  . Alcohol Use: No  . Drug Use: Not on file  . Sexually Active: Not on file   Other Topics Concern  . Not on file   Social History Narrative   RetiredMarried -3 children Tobacco abuse-noAlcohol use-no  Regular exercise-no    Past Surgical History  Procedure Date  . Tonsillectomy   . Total abdominal hysterectomy w/ bilateral salpingoophorectomy 1986  . Bilateral salpingoophorectomy   . Bladder suspension     bladder tack with TAHBSO    Family History  Problem Relation Age of Onset  . Heart disease Father   . Heart disease Brother     heart attack  . Heart disease Brother     No Known Allergies  Current Outpatient Prescriptions on File Prior to  Visit  Medication Sig Dispense Refill  . aspirin 81 MG tablet Take 81 mg by mouth daily.        Marland Kitchen ENSURE (ENSURE) Take 237 mLs by mouth 2 (two) times daily between meals.        Marland Kitchen ezetimibe (ZETIA) 10 MG tablet Take 1 tablet (10 mg total) by mouth daily.  90 tablet  1  . metoprolol (LOPRESSOR) 50 MG tablet Take 50 mg by mouth daily.       Marland Kitchen ALPRAZolam (XANAX) 0.25 MG tablet Take 0.25 mg by mouth 2 (two) times daily as needed.        Marland Kitchen amLODipine (NORVASC) 2.5 MG tablet Take 2.5 mg by mouth daily.        . mirtazapine (REMERON) 7.5 MG tablet Take 7.5 mg by mouth at bedtime.          BP 150/64  Pulse 60  Temp(Src) 97.8 F (36.6 C) (Oral)  Resp 16  Wt 95 lb 1.3 oz (43.128 kg)       Objective:   Physical Exam  Constitutional: She appears well-developed and well-nourished. No distress.  Thin elderly white female.  NAD  Cardiovascular: Normal rate and regular rhythm.   No murmur heard. Pulmonary/Chest: Effort normal and breath sounds normal. No respiratory distress. She has no wheezes. She has no rales. She exhibits no tenderness.  Abdominal: Soft. She exhibits no distension and no mass. There is no tenderness. There is no rebound and no guarding.  Genitourinary: Guaiac negative stool.  Skin:       Keratosis noted overlying left scapula.   Psychiatric: Her speech is normal and behavior is normal. Thought content normal. Her mood appears not anxious. Her affect is blunt. Her affect is not angry, not labile and not inappropriate. Cognition and memory are impaired. She does not exhibit a depressed mood.          Assessment & Plan:

## 2011-04-25 NOTE — Assessment & Plan Note (Signed)
Pt is heme negative today.  Check iron level.  If low, plan to replete.  Repeat cbc.  Renal insufficiency is likely playing a role.

## 2011-04-26 ENCOUNTER — Encounter: Payer: Self-pay | Admitting: Family

## 2011-06-16 ENCOUNTER — Telehealth: Payer: Self-pay | Admitting: Family

## 2011-06-16 DIAGNOSIS — M79673 Pain in unspecified foot: Secondary | ICD-10-CM

## 2011-06-16 NOTE — Telephone Encounter (Signed)
Husband came to office for his apt. And requests referral to podiatry for  His wife due to bunions and chronic foot pain.

## 2011-07-17 ENCOUNTER — Encounter: Payer: Self-pay | Admitting: Family

## 2011-07-17 ENCOUNTER — Inpatient Hospital Stay (HOSPITAL_COMMUNITY)
Admission: AD | Admit: 2011-07-17 | Discharge: 2011-07-21 | DRG: 689 | Disposition: A | Discharge status: Home Health | Payer: Medicare Other | Source: Ambulatory Visit | Attending: Family Medicine | Admitting: Family Medicine

## 2011-07-17 ENCOUNTER — Inpatient Hospital Stay (HOSPITAL_COMMUNITY): Payer: Medicare Other

## 2011-07-17 ENCOUNTER — Encounter (HOSPITAL_COMMUNITY): Payer: Self-pay | Admitting: *Deleted

## 2011-07-17 ENCOUNTER — Ambulatory Visit (INDEPENDENT_AMBULATORY_CARE_PROVIDER_SITE_OTHER): Payer: Medicare Other | Admitting: Family

## 2011-07-17 ENCOUNTER — Other Ambulatory Visit: Payer: Self-pay

## 2011-07-17 ENCOUNTER — Telehealth: Payer: Self-pay | Admitting: Family

## 2011-07-17 VITALS — BP 110/70 | HR 72 | Temp 97.9°F | Resp 16 | Wt 91.0 lb

## 2011-07-17 DIAGNOSIS — R634 Abnormal weight loss: Secondary | ICD-10-CM

## 2011-07-17 DIAGNOSIS — N179 Acute kidney failure, unspecified: Secondary | ICD-10-CM | POA: Diagnosis present

## 2011-07-17 DIAGNOSIS — I10 Essential (primary) hypertension: Secondary | ICD-10-CM | POA: Diagnosis present

## 2011-07-17 DIAGNOSIS — R413 Other amnesia: Secondary | ICD-10-CM

## 2011-07-17 DIAGNOSIS — B965 Pseudomonas (aeruginosa) (mallei) (pseudomallei) as the cause of diseases classified elsewhere: Secondary | ICD-10-CM | POA: Diagnosis present

## 2011-07-17 DIAGNOSIS — Z681 Body mass index (BMI) 19 or less, adult: Secondary | ICD-10-CM

## 2011-07-17 DIAGNOSIS — I129 Hypertensive chronic kidney disease with stage 1 through stage 4 chronic kidney disease, or unspecified chronic kidney disease: Secondary | ICD-10-CM | POA: Diagnosis present

## 2011-07-17 DIAGNOSIS — N183 Chronic kidney disease, stage 3 unspecified: Secondary | ICD-10-CM | POA: Diagnosis present

## 2011-07-17 DIAGNOSIS — N390 Urinary tract infection, site not specified: Secondary | ICD-10-CM

## 2011-07-17 DIAGNOSIS — N289 Disorder of kidney and ureter, unspecified: Secondary | ICD-10-CM

## 2011-07-17 DIAGNOSIS — I4891 Unspecified atrial fibrillation: Secondary | ICD-10-CM

## 2011-07-17 DIAGNOSIS — E860 Dehydration: Secondary | ICD-10-CM

## 2011-07-17 DIAGNOSIS — Z66 Do not resuscitate: Secondary | ICD-10-CM | POA: Diagnosis present

## 2011-07-17 DIAGNOSIS — R5381 Other malaise: Secondary | ICD-10-CM | POA: Diagnosis present

## 2011-07-17 DIAGNOSIS — N3090 Cystitis, unspecified without hematuria: Secondary | ICD-10-CM | POA: Diagnosis present

## 2011-07-17 DIAGNOSIS — F0390 Unspecified dementia without behavioral disturbance: Secondary | ICD-10-CM | POA: Diagnosis present

## 2011-07-17 DIAGNOSIS — I951 Orthostatic hypotension: Secondary | ICD-10-CM | POA: Diagnosis present

## 2011-07-17 DIAGNOSIS — E43 Unspecified severe protein-calorie malnutrition: Secondary | ICD-10-CM | POA: Diagnosis present

## 2011-07-17 LAB — COMPREHENSIVE METABOLIC PANEL
ALT: 10 U/L (ref 0–35)
AST: 23 U/L (ref 0–37)
Albumin: 3.1 g/dL — ABNORMAL LOW (ref 3.5–5.2)
CO2: 26 mEq/L (ref 19–32)
Chloride: 95 mEq/L — ABNORMAL LOW (ref 96–112)
GFR calc non Af Amer: 27 mL/min — ABNORMAL LOW (ref >90)
Potassium: 4.2 mEq/L (ref 3.5–5.1)
Sodium: 131 mEq/L — ABNORMAL LOW (ref 135–145)
Total Bilirubin: 0.7 mg/dL (ref 0.3–1.2)

## 2011-07-17 LAB — POCT URINALYSIS DIPSTICK
Glucose, UA: NEGATIVE
Nitrite, UA: NEGATIVE
Spec Grav, UA: >=1.03
Urobilinogen, UA: 0.2

## 2011-07-17 LAB — CBC
HCT: 37.2 % (ref 36.0–46.0)
Hemoglobin: 12.6 g/dL (ref 12.0–15.0)
MCV: 91.6 fL (ref 78.0–100.0)
Platelets: 220 10*3/uL (ref 150–400)
RBC: 4.01 MIL/uL (ref 3.87–5.11)
RBC: 4.06 MIL/uL (ref 3.87–5.11)
RDW: 12.6 % (ref 11.5–15.5)
RDW: 12.7 % (ref 11.5–15.5)
WBC: 6.6 10*3/uL (ref 4.0–10.5)
WBC: 6.9 10*3/uL (ref 4.0–10.5)

## 2011-07-17 LAB — PROTIME-INR
INR: 1.07 (ref 0.00–1.49)
Prothrombin Time: 14.1 seconds (ref 11.6–15.2)

## 2011-07-17 LAB — PHOSPHORUS: Phosphorus: 3.4 mg/dL (ref 2.3–4.6)

## 2011-07-17 LAB — TSH: TSH: 1.823 u[IU]/mL (ref 0.350–4.500)

## 2011-07-17 LAB — APTT: aPTT: 29 seconds (ref 24–37)

## 2011-07-17 LAB — GLUCOSE, CAPILLARY: Glucose-Capillary: 148 mg/dL — ABNORMAL HIGH (ref 70–99)

## 2011-07-17 MED ORDER — ENSURE CLINICAL ST REVIGOR PO LIQD
237.0000 mL | Freq: Two times a day (BID) | ORAL | Status: DC
  Administered 2011-07-18 – 2011-07-21 (×7): 237 mL via ORAL

## 2011-07-17 MED ORDER — ACETAMINOPHEN 325 MG PO TABS
650.0000 mg | ORAL_TABLET | Freq: Four times a day (QID) | ORAL | Status: DC | PRN
  Administered 2011-07-19 – 2011-07-20 (×2): 650 mg via ORAL
  Filled 2011-07-17 (×2): qty 2

## 2011-07-17 MED ORDER — ASPIRIN 81 MG PO CHEW
81.0000 mg | CHEWABLE_TABLET | Freq: Every day | ORAL | Status: DC
  Administered 2011-07-17 – 2011-07-21 (×5): 81 mg via ORAL
  Filled 2011-07-17 (×5): qty 1

## 2011-07-17 MED ORDER — CEFTRIAXONE SODIUM 1 G IJ SOLR
1.0000 g | INTRAMUSCULAR | Status: DC
  Administered 2011-07-17: 1 g via INTRAVENOUS
  Filled 2011-07-17 (×2): qty 10

## 2011-07-17 MED ORDER — METOPROLOL TARTRATE 50 MG PO TABS
50.0000 mg | ORAL_TABLET | Freq: Every day | ORAL | Status: DC
  Administered 2011-07-17: 50 mg via ORAL
  Filled 2011-07-17 (×2): qty 1

## 2011-07-17 MED ORDER — ENOXAPARIN SODIUM 30 MG/0.3ML ~~LOC~~ SOLN
30.0000 mg | SUBCUTANEOUS | Status: DC
  Administered 2011-07-17 – 2011-07-19 (×3): 30 mg via SUBCUTANEOUS
  Filled 2011-07-17 (×4): qty 0.3

## 2011-07-17 MED ORDER — DEXTROSE-NACL 5-0.9 % IV SOLN
INTRAVENOUS | Status: AC
  Administered 2011-07-17 – 2011-07-18 (×2): via INTRAVENOUS

## 2011-07-17 MED ORDER — PANTOPRAZOLE SODIUM 40 MG PO TBEC
40.0000 mg | DELAYED_RELEASE_TABLET | Freq: Every day | ORAL | Status: DC
  Administered 2011-07-17 – 2011-07-21 (×5): 40 mg via ORAL
  Filled 2011-07-17 (×7): qty 1

## 2011-07-17 MED ORDER — MIRTAZAPINE 7.5 MG PO TABS
7.5000 mg | ORAL_TABLET | Freq: Every day | ORAL | Status: DC
  Administered 2011-07-17 – 2011-07-20 (×4): 7.5 mg via ORAL
  Filled 2011-07-17 (×5): qty 1

## 2011-07-17 MED ORDER — ADULT MULTIVITAMIN W/MINERALS CH
1.0000 | ORAL_TABLET | Freq: Every day | ORAL | Status: DC
  Administered 2011-07-17 – 2011-07-21 (×5): 1 via ORAL
  Filled 2011-07-17 (×6): qty 1

## 2011-07-17 MED ORDER — MORPHINE SULFATE 2 MG/ML IJ SOLN: 1.0000 mg | INTRAMUSCULAR | Status: DC | PRN

## 2011-07-17 MED ORDER — EZETIMIBE 10 MG PO TABS
10.0000 mg | ORAL_TABLET | Freq: Every day | ORAL | Status: DC
  Administered 2011-07-17 – 2011-07-21 (×5): 10 mg via ORAL
  Filled 2011-07-17 (×5): qty 1

## 2011-07-17 MED ORDER — ONDANSETRON HCL 4 MG/2ML IJ SOLN: 4.0000 mg | Freq: Four times a day (QID) | INTRAMUSCULAR | Status: DC | PRN

## 2011-07-17 MED ORDER — AMLODIPINE BESYLATE 2.5 MG PO TABS
2.5000 mg | ORAL_TABLET | Freq: Every day | ORAL | Status: DC
  Administered 2011-07-17: 2.5 mg via ORAL
  Filled 2011-07-17 (×2): qty 1

## 2011-07-17 MED ORDER — ALPRAZOLAM 0.25 MG PO TABS: 0.2500 mg | ORAL_TABLET | Freq: Two times a day (BID) | ORAL | Status: DC | PRN

## 2011-07-17 MED ORDER — ONDANSETRON HCL 4 MG PO TABS: 4.0000 mg | ORAL_TABLET | Freq: Four times a day (QID) | ORAL | Status: DC | PRN

## 2011-07-17 MED ORDER — ACETAMINOPHEN 650 MG RE SUPP: 650.0000 mg | Freq: Four times a day (QID) | RECTAL | Status: DC | PRN

## 2011-07-17 MED ORDER — DOCUSATE SODIUM 100 MG PO CAPS
100.0000 mg | ORAL_CAPSULE | Freq: Two times a day (BID) | ORAL | Status: DC
  Administered 2011-07-17 – 2011-07-21 (×8): 100 mg via ORAL
  Filled 2011-07-17 (×9): qty 1

## 2011-07-17 NOTE — Telephone Encounter (Signed)
Notified pt's husband and faxed order.

## 2011-07-17 NOTE — Progress Notes (Signed)
Subjective:    Patient ID: SERENITIE VINTON, female    DOB: [birth year], 78 y.o.   MRN: [id number]  HPI  Ms.  Molla is an 78 yr old female who is brought today by her husband for evaluation of her anorexia.  Husband tells me that she saw Dr. Briant Cedar last Monday- was told that she had a "kidney infection" and was given a 5 day course of amoxicillin.  She missed a few doses due to GI upset.  According to her husband she has had weakness and very poor PO intake.   Ate a scrambled egg today, small amount of toast.  Has not taken her AM medication yet today.     She stopped the remeron.  She reports feeling weak today, no appetite.  She has not had any fever.  Stays "cold"  Per her husband.   Review of Systems See HPI  Past Medical History  Diagnosis Date  . Hyperlipidemia   . Hypertension   . AF (atrial fibrillation)   . Hyperthyroidism     remotely in 1941  . Normocytic anemia     2002  . S/P TAH-BSO (total abdominal hysterectomy and bilateral salpingo-oophorectomy)   . Allergy   . Arrhythmia   . Anxiety     History   Social History  . Marital Status: Married    Spouse Name: [name]    Number of Children: 3  . Years of Education: N/A   Occupational History  . retired    Social History Main Topics  . Smoking status: Never Smoker   . Smokeless tobacco: Never Used  . Alcohol Use: No  . Drug Use: Not on file  . Sexually Active: Not on file   Other Topics Concern  . Not on file   Social History Narrative   RetiredMarried -3 children Tobacco abuse-noAlcohol use-no  Regular exercise-no    Past Surgical History  Procedure Date  . Tonsillectomy   . Total abdominal hysterectomy w/ bilateral salpingoophorectomy 1986  . Bilateral salpingoophorectomy   . Bladder suspension     bladder tack with TAHBSO    Family History  Problem Relation Age of Onset  . Heart disease Father   . Heart disease Brother     heart attack  . Heart disease Brother     No Known  Allergies  Current Outpatient Prescriptions on File Prior to Visit  Medication Sig Dispense Refill  . ALPRAZolam (XANAX) 0.25 MG tablet Take 0.25 mg by mouth 2 (two) times daily as needed.        Marland Kitchen amLODipine (NORVASC) 2.5 MG tablet Take 2.5 mg by mouth daily.        Marland Kitchen aspirin 81 MG tablet Take 81 mg by mouth daily.        Marland Kitchen ENSURE (ENSURE) Take 237 mLs by mouth 2 (two) times daily between meals.        Marland Kitchen ezetimibe (ZETIA) 10 MG tablet Take 1 tablet (10 mg total) by mouth daily.  90 tablet  1  . metoprolol (LOPRESSOR) 50 MG tablet Take 50 mg by mouth daily.       . mirtazapine (REMERON) 7.5 MG tablet Take 7.5 mg by mouth at bedtime.          BP 110/70  Pulse 72  Temp(Src) 97.9 F (36.6 C) (Oral)  Resp 16  Wt 91 lb 0.6 oz (41.295 kg)       Objective:   Physical Exam  Constitutional: No distress.  Cachectic, thin, pale, weak appearing elderly female in NAD  HENT:  Head: Normocephalic and atraumatic.  Eyes: No scleral icterus.  Neck: No thyromegaly present.  Cardiovascular: Normal rate and regular rhythm.   No murmur heard. Pulmonary/Chest: Effort normal and breath sounds normal. No respiratory distress. She has no wheezes. She has no rales. She exhibits no tenderness.  Abdominal: Soft. Bowel sounds are normal. She exhibits no distension. There is no tenderness.  Musculoskeletal: She exhibits no edema.  Neurological:       Hard of hearing.   Psychiatric:       Flat affect          Assessment & Plan:  25 minutes spent with pt today.  > 50% of this time was spent counseling pt on weight loss, depression and need for hospitalization.  Pt and husband are agreeable to admission. Report provided to Dr. Jomarie Longs, [facility], who has accepted pt to a floor bed.  Spoke with bed control, they are waiting for a bed and will call us as soon as bed is available.  Husband has brought pt home to wait until bed becomes available.

## 2011-07-17 NOTE — H&P (Signed)
PCP:   Lemont Fillers., NP, NP   Chief Complaint:  General malaise,"dehydrated".  HPI: Leah Harper was referred by her PCP for direct admission as she has been quite lethargic, and has had poor appetite for a week or so. She has advanced dementia and her very pleasant husband who gives the history states that patient was seen by Dr Briant Cedar last Monday and was started on amoxicillin for a uti. She skipped a few doses due to abdominal upset, and has continued to have malaise. She denies dysuria/diarrhea/cough or fever. She was seen in the office and felt to be dry, hence referral for admission.  Review of Systems:  Unremarkable, except as highlighted in the HPI.  Past Medical History: Past Medical History  Diagnosis Date  . Hyperlipidemia   . Hypertension   . AF (atrial fibrillation)   . Hyperthyroidism     remotely in 1941  . Normocytic anemia     2002  . S/P TAH-BSO (total abdominal hysterectomy and bilateral salpingo-oophorectomy)   . Allergy   . Arrhythmia   . Anxiety    Past Surgical History  Procedure Date  . Tonsillectomy   . Total abdominal hysterectomy w/ bilateral salpingoophorectomy 1986  . Bilateral salpingoophorectomy   . Bladder suspension     bladder tack with TAHBSO    Medications: Prior to Admission medications   Medication Sig Start Date End Date Taking? Authorizing Provider  ALPRAZolam (XANAX) 0.25 MG tablet Take 0.25 mg by mouth 2 (two) times daily as needed. For anxiety   Yes Historical Provider, MD  amLODipine (NORVASC) 2.5 MG tablet Take 2.5 mg by mouth daily.     Yes Historical Provider, MD  amoxicillin (AMOXIL) 250 MG/5ML suspension Take 500 mg by mouth 2 (two) times daily.   Yes Historical Provider, MD  aspirin 81 MG tablet Take 81 mg by mouth daily.     Yes Historical Provider, MD  ENSURE (ENSURE) Take 237 mLs by mouth 2 (two) times daily between meals.     Yes Historical Provider, MD  ezetimibe (ZETIA) 10 MG tablet Take 1 tablet (10 mg  total) by mouth daily. 12/26/10  Yes Melissa S. Peggyann Juba, NP  metoprolol (LOPRESSOR) 50 MG tablet Take 50 mg by mouth daily.    Yes Historical Provider, MD  mirtazapine (REMERON) 7.5 MG tablet Take 7.5 mg by mouth at bedtime.     Yes Historical Provider, MD    Allergies:  No Known Allergies  Social History:  reports that she has never smoked. She has never used smokeless tobacco. She reports that she does not drink alcohol. Her drug history not on file.   Family History: Family History  Problem Relation Age of Onset  . Heart disease Father   . Heart disease Brother     heart attack  . Heart disease Brother     Physical Exam: Filed Vitals:   07/17/11 1617  BP: 151/84  Pulse: 75  Temp: 98.4 F (36.9 C)  TempSrc: Oral  Resp: 20  Height: 5\' 4"  (1.626 m)  Weight: 40.1 kg (88 lb 6.5 oz)  SpO2: 98%   Dry oral mucosa, a growth on the palate, nonbleeding. No JVD or carotid duplex. RS- lungs clear. CVS- S1S2. RRR. Abdomen- soft, non tender. +BS. No palpable organomegaly. CNS- mostly quiet, pleasantly confused. Moves all extremities. Extremities- no pedal edema. Reduced skin turgor.   Labs on Admission:  No results found for this basename: NA:2,K:2,CL:2,CO2:2,GLUCOSE:2,BUN:2,CREATININE:2,CALCIUM:2,MG:2,PHOS:2 in the last 72 hours No results found for this  basename: AST:2,ALT:2,ALKPHOS:2,BILITOT:2,PROT:2,ALBUMIN:2 in the last 72 hours No results found for this basename: LIPASE:2,AMYLASE:2 in the last 72 hours No results found for this basename: WBC:2,NEUTROABS:2,HGB:2,HCT:2,MCV:2,PLT:2 in the last 72 hours No results found for this basename: CKTOTAL:3,CKMB:3,CKMBINDEX:3,TROPONINI:3 in the last 72 hours No results found for this basename: TSH,T4TOTAL,FREET3,T3FREE,THYROIDAB in the last 72 hours No results found for this basename: VITAMINB12:2,FOLATE:2,FERRITIN:2,TIBC:2,IRON:2,RETICCTPCT:2 in the last 72 hours  Radiological Exams on Admission: No results found.  Assessment Leah  Nowak presents with what looks like UTI failing outpatient treatment. Oral intake may be an issue, hence possibly may not have taken abx as prescribed. Worry for worsening renal failure. Plan .Dehydration- will start ivf. .Cystitis- ua/uc/bc/cxr. Ceftriaxone iv. .Dementia- seems at baseline per relatives. Marland KitchenHTN (hypertension)- uncontrolled. Resume home meds. .tongue lesion- address later, once acute issues addressed. . ckd stage 3- expect bump in bun/cr due to uti/dehydration/abx. Monitor with ivf. DNR/DNI- husband states that they both want to go peacefully when the time comes. Condition guarded.  Mykelti Goldenstein [phone]. 07/17/2011, 6:12 PM

## 2011-07-17 NOTE — Progress Notes (Signed)
Rapid Response Event Note  Overview:  Called to room 1507 per Stacy RN on 5 east. Per [facility] pt found by husband in chiar at bedside with eyes closed and very lethargic. Pt placed back in bed, remaining lethargic and not following commands. BS checked, yielding      . Upon my arrival pt found on bed, lethargic, following simple commands and responsive to pain with poor eye contact.    Initial Focused Assessment: Pt lethargic, slowly improving LOC. At first pt not responsive to voice, just pain. Within 10 minute pt more awake,able to state name, location and confused on year and situation. Pt baseline is mild dementia, and per spouse at bedside "She wouldn't know the year on a good day" Pt follows simple commands. MAEW X 4 weakly, equal grips. Denies pain. Pupils equal size 2 brisk reaction bilat.   Interventions: Heart monitor placed on pt, reveling AFIB on monitor. Pt not on tele at this time, 12 lead EKG obtained with AFIB also. Page placed NP for [facility]. M. Edwards. Call back received, NP updated on status and new EKG changes per [facility]. Orders received for tele monitoring and orthostatic VS in the am. Pt left in bed resting quietly, back to baseline neuro status. Bed alarm on, safety measure discussed with family , pt  and RN.  Event Summary:   at    2240--- 82 bpm afib, 97% 2 L [place], 23 rr, 109/61          2245-EKG done          2250 93 bpm afib, 98%, 24 rr, 119/75          2300  93 bpm afib, 97% ra, 20 rr, 122/73     at          [place], James Ivanoff Trine Fread

## 2011-07-17 NOTE — Assessment & Plan Note (Addendum)
Probable dementia- She is resistant to treatment for this.  Unfortunately, I think that dementia is contributing to her overall decline.

## 2011-07-17 NOTE — Assessment & Plan Note (Signed)
This is now being followed by Dr. Briant Cedar.  Last creatinine on file 9/12 was 1.31.

## 2011-07-17 NOTE — Telephone Encounter (Signed)
Please call pt's husband and let him know that patient has bed available at [facility] room 1507.  They should go directly to admitting at [facility],  Please fax holding orders to [phone].

## 2011-07-17 NOTE — Patient Instructions (Signed)
Stop amlodipine (norvasc).   

## 2011-07-17 NOTE — Assessment & Plan Note (Signed)
This is now exacerbated by UTI, however this has been a longstanding issue. Most recently she has been treated with Remeron at bedtime, but she stopped it. Husband thinks that it was helping her.  Pt is very resistant to any medication for depression or memory loss.

## 2011-07-17 NOTE — Assessment & Plan Note (Signed)
78 yr old female with nausea, malaise, anorexia and dehydration.  UA in office today shows moderate leukocytes and + blood.  She completed amoxicillin.  At this point, I suspect ongoing UTI.  In the setting of her weakness and dehydration, I think she would be best cared for as an inpatient.

## 2011-07-18 ENCOUNTER — Inpatient Hospital Stay (HOSPITAL_COMMUNITY): Payer: Medicare Other

## 2011-07-18 DIAGNOSIS — R404 Transient alteration of awareness: Secondary | ICD-10-CM

## 2011-07-18 DIAGNOSIS — I359 Nonrheumatic aortic valve disorder, unspecified: Secondary | ICD-10-CM

## 2011-07-18 LAB — BASIC METABOLIC PANEL
Calcium: 9.7 mg/dL (ref 8.4–10.5)
Creatinine, Ser: 1.6 mg/dL — ABNORMAL HIGH (ref 0.50–1.10)
GFR calc Af Amer: 32 mL/min — ABNORMAL LOW (ref >90)
GFR calc non Af Amer: 27 mL/min — ABNORMAL LOW (ref >90)
Sodium: 134 mEq/L — ABNORMAL LOW (ref 135–145)

## 2011-07-18 LAB — URINALYSIS, ROUTINE W REFLEX MICROSCOPIC
Glucose, UA: NEGATIVE mg/dL
Ketones, ur: NEGATIVE mg/dL
Nitrite: NEGATIVE
Protein, ur: NEGATIVE mg/dL
pH: 5.5 (ref 5.0–8.0)

## 2011-07-18 LAB — CARDIAC PANEL(CRET KIN+CKTOT+MB+TROPI)
CK, MB: 2.8 ng/mL (ref 0.3–4.0)
CK, MB: 2.7 ng/mL (ref 0.3–4.0)
Relative Index: INVALID (ref 0.0–2.5)
Total CK: 51 U/L (ref 7–177)

## 2011-07-18 LAB — URINE CULTURE: Colony Count: 15000

## 2011-07-18 LAB — CBC
Platelets: 220 10*3/uL (ref 150–400)
RBC: 4.22 MIL/uL (ref 3.87–5.11)
RDW: 12.7 % (ref 11.5–15.5)
WBC: 6.6 10*3/uL (ref 4.0–10.5)

## 2011-07-18 LAB — URINE MICROSCOPIC-ADD ON

## 2011-07-18 MED ORDER — SODIUM CHLORIDE 0.9 % IV BOLUS (SEPSIS)
500.0000 mL | Freq: Once | INTRAVENOUS | Status: AC
  Administered 2011-07-18: 500 mL via INTRAVENOUS

## 2011-07-18 MED ORDER — PIPERACILLIN-TAZOBACTAM 3.375 G IVPB
3.3750 g | Freq: Three times a day (TID) | INTRAVENOUS | Status: DC
  Administered 2011-07-18 – 2011-07-19 (×3): 3.375 g via INTRAVENOUS
  Filled 2011-07-18 (×5): qty 50

## 2011-07-18 MED ORDER — VANCOMYCIN HCL 500 MG IV SOLR
500.0000 mg | INTRAVENOUS | Status: DC
  Administered 2011-07-19: 500 mg via INTRAVENOUS
  Filled 2011-07-18 (×3): qty 500

## 2011-07-18 NOTE — Progress Notes (Signed)
Leah Harper made aware of pt's orthostatic BP changes this AM (see flowsheet). Orders received for bolus. Bolus given. Will continue to monitor.

## 2011-07-18 NOTE — Progress Notes (Signed)
ANTIBIOTIC CONSULT NOTE - INITIAL  Pharmacy Consult for Vancomycin/Zosyn Indication: r/o sepsis  No Known Allergies  Patient Measurements: Height: 5\' 4"  (162.6 cm) Weight: 88 lb 6.5 oz (40.1 kg) IBW/kg (Calculated) : 54.7   Vital Signs: Temp: 97.8 F (36.6 C) (01/21 2230) Temp src: Oral (01/21 2230) BP: 105/62 mmHg (01/22 0929) Pulse Rate: 95  (01/22 0453) Intake/Output from previous day: 01/21 0701 - 01/22 0700 In: 852.5 [I.V.:802.5; IV Piggyback:50] Out: 600 [Urine:600] Intake/Output from this shift:    Labs:  Basename 07/18/11 0505 07/17/11 1820 07/17/11 1800  WBC 6.6 6.9 6.6  HGB 13.1 12.6 12.4  PLT 220 227 220  LABCREA -- -- --  CREATININE 1.60* -- 1.64*   Estimated Creatinine Clearance: 15.1 ml/min (by C-G formula based on Cr of 1.6). No results found for this basename: VANCOTROUGH:2,VANCOPEAK:2,VANCORANDOM:2,GENTTROUGH:2,GENTPEAK:2,GENTRANDOM:2,TOBRATROUGH:2,TOBRAPEAK:2,TOBRARND:2,AMIKACINPEAK:2,AMIKACINTROU:2,AMIKACIN:2, in the last 72 hours   Microbiology: No results found for this or any previous visit (from the past 720 hour(s)).  Medical History: Past Medical History  Diagnosis Date  . Hyperlipidemia   . Hypertension   . AF (atrial fibrillation)   . Hyperthyroidism     remotely in 1941  . Normocytic anemia     2002  . S/P TAH-BSO (total abdominal hysterectomy and bilateral salpingo-oophorectomy)   . Allergy   . Arrhythmia   . Anxiety     Medications:  Scheduled:    . amLODipine  2.5 mg Oral Daily  . aspirin  81 mg Oral Daily  . docusate sodium  100 mg Oral BID  . enoxaparin  30 mg Subcutaneous Q24H  . ezetimibe  10 mg Oral Daily  . feeding supplement  237 mL Oral BID BM  . mirtazapine  7.5 mg Oral QHS  . mulitivitamin with minerals  1 tablet Oral Daily  . pantoprazole  40 mg Oral Q1200  . sodium chloride  500 mL Intravenous Once  . sodium chloride  500 mL Intravenous Once  . DISCONTD: cefTRIAXone (ROCEPHIN)  IV  1 g Intravenous Q24H    . DISCONTD: metoprolol  50 mg Oral Daily   Anti-infectives     Start     Dose/Rate Route Frequency Ordered Stop   07/17/11 2000   cefTRIAXone (ROCEPHIN) 1 g in dextrose 5 % 50 mL IVPB  Status:  Discontinued        1 g 100 mL/hr over 30 Minutes Intravenous Every 24 hours 07/17/11 1811 07/18/11 0908         Assessment: 78 yo Female, admit with malaise, likely UTI treated as outpatient w/amoxicillin. Full course not completed due to stomach upset. Urine Cx ordered 1/22, pt received Rocephin 1/21 Blood cx x 2 ordered 1/21 Clearance poor, 64ml/min (CG), 27 ml/min normalized.  Goal of Therapy:  Vancomycin trough level 15-20 mcg/ml Zosyn dose/schedule appropriate for renal fx  Plan:  Vancomycin 500mg  q24hr Zosyn 3.375gm q8-4 hr infusion  Dabria Wadas L 07/18/2011,9:45 AM

## 2011-07-18 NOTE — Progress Notes (Signed)
Bilateral carotid artery duplex completed.  Preliminary report is no evidence of hemodynamically significant internal carotid artery stenosis.  Vertebral artery flow is antegrade.  Jefferson Fuel, RVT

## 2011-07-18 NOTE — Progress Notes (Signed)
  Echocardiogram 2D Echocardiogram has been performed.  Jorje Guild [facility] 07/18/2011, 10:59 AM

## 2011-07-18 NOTE — Evaluation (Signed)
Physical Therapy Evaluation Patient Details Name: Leah Harper MRN: [id number] DOB: 24-Mar-1920 Today's Date: 07/18/2011  Problem List:  Patient Active Problem List  Diagnoses  . HYPERLIPIDEMIA-MIXED  . ANXIETY DEPRESSION  . MIXED HEARING LOSS BILATERAL  . HYPERTENSION  . ATRIAL FIBRILLATION  . CAROTID BRUIT  . PERS HX NONCOMPLIANCE W/MED TX PRS HAZARDS HLTH  . CHEST PAIN, ATYPICAL, HX OF  . ATYPICAL DEPRESSIVE DISORDER  . Renal insufficiency  . Memory loss  . Anorexia  . Abdominal aortic aneurysm  . Mild depression  . Fatigue  . Hip pain, right  . UTI (lower urinary tract infection)  . Normocytic anemia  . Anemia  . Skin lesion of back  . Weight loss  . Dehydration  . Cystitis  . Dementia  . HTN (hypertension)  . Chronic kidney disease (CKD), stage III (moderate)    Past Medical History:  Past Medical History  Diagnosis Date  . Hyperlipidemia   . Hypertension   . AF (atrial fibrillation)   . Hyperthyroidism     remotely in 1941  . Normocytic anemia     2002  . S/P TAH-BSO (total abdominal hysterectomy and bilateral salpingo-oophorectomy)   . Allergy   . Arrhythmia   . Anxiety    Past Surgical History:  Past Surgical History  Procedure Date  . Tonsillectomy   . Total abdominal hysterectomy w/ bilateral salpingoophorectomy 1986  . Bilateral salpingoophorectomy   . Bladder suspension     bladder tack with TAHBSO    PT Assessment/Plan/Recommendation PT Assessment Clinical Impression Statement: Patient admitted with dehydration and UTI and hypotensive event this am. She presents to PT with decreased independence with mobility, decreased LE strength, decreased balance, and will benefit from skilled PT in acute setting to maximize independence and allow d/c home with spouse/family assist and HHPT. PT Recommendation/Assessment: Patient will need skilled PT in the acute care venue PT Problem List: Decreased strength;Decreased safety awareness;Decreased  balance;Decreased mobility;Decreased knowledge of use of DME PT Therapy Diagnosis : Abnormality of gait;Generalized weakness PT Plan PT Frequency: Min 3X/week PT Treatment/Interventions: DME instruction;Gait training;Stair training;Functional mobility training;Therapeutic activities;Therapeutic exercise;Balance training;Patient/family education PT Recommendation Follow Up Recommendations: Home health PT;Supervision/Assistance - 24 hour Equipment Recommended: Rolling walker with 5" wheels PT Goals  Acute Rehab PT Goals PT Goal Formulation: With patient/family Time For Goal Achievement: 7 days Pt will go Supine/Side to Sit: with modified independence PT Goal: Supine/Side to Sit - Progress: Goal set today Pt will go Sit to Supine/Side: with modified independence PT Goal: Sit to Supine/Side - Progress: Goal set today Pt will go Sit to Stand: with modified independence PT Goal: Sit to Stand - Progress: Goal set today Pt will go Stand to Sit: with modified independence PT Goal: Stand to Sit - Progress: Goal set today Pt will Ambulate: >150 feet;with rolling walker PT Goal: Ambulate - Progress: Goal set today Pt will Go Up / Down Stairs: 3-5 stairs;with rail(s);with supervision PT Goal: Up/Down Stairs - Progress: Goal set today  PT Evaluation Precautions/Restrictions  Precautions Precautions: Fall Prior Functioning  Home Living Lives With: Spouse Type of Home: House Home Layout: Two level;Full bath on main level;Able to live on main level with bedroom/bathroom Alternate Level Stairs-Number of Steps: master on main Home Access: Stairs to enter Entrance Stairs-Rails: Right Entrance Stairs-Number of Steps: 3 at back and 3 at front Home Adaptive Equipment: None Prior Function Level of Independence: Independent with basic ADLs;Independent with homemaking with ambulation;Independent with gait;Independent with transfers Cognition Cognition Arousal/Alertness: Awake/alert  Overall  Cognitive Status: History of cognitive impairments History of Cognitive Impairment: Appears at baseline functioning Sensation/Coordination Sensation Light Touch: Impaired Detail Light Touch Impaired Details: Impaired LLE;Impaired RLE Extremity Assessment RLE Assessment RLE Assessment: Exceptions to [facility] RLE AROM (degrees) Overall AROM Right Lower Extremity: Within functional limits for tasks assessed RLE Strength RLE Overall Strength Comments: hip flexion 3+/5, knee extension 4+/5, flexion 4-/5, ankle dorsiflexion 4+/5 LLE Assessment LLE Assessment: Exceptions to WFL LLE AROM (degrees) Overall AROM Left Lower Extremity: Within functional limits for tasks assessed LLE Strength LLE Overall Strength Comments: hip flexion 3+/5, knee extension 4+/5, flexion 4-/5, ankle dorsiflexion 4+/5 Mobility (including Balance) Bed Mobility Bed Mobility: Yes Supine to Sit: 5: Supervision Supine to Sit Details (indicate cue type and reason): cues for technique Sitting - Scoot to Edge of Bed: 5: Supervision Sitting - Scoot to [place] of Bed Details (indicate cue type and reason): cues for scooting all the way out Transfers Transfers: Yes Sit to Stand: 4: Min assist;From bed;With upper extremity assist Stand to Sit: 4: Min assist;With upper extremity assist;To chair/3-in-1;With armrests Stand to Sit Details: cues for safety (leaves walker at door to walk to chair) Ambulation/Gait Ambulation/Gait: Yes Ambulation/Gait Assistance: 4: Min assist Ambulation/Gait Assistance Details (indicate cue type and reason): minguard assist (first 32' with no device with min/mod assist and LOB to left Ambulation Distance (Feet): 200 Feet Assistive device: Rolling walker Gait Pattern: Decreased stride length;Trunk flexed    Exercise    End of Session PT - End of Session Equipment Utilized During Treatment: Gait belt Activity Tolerance: Patient tolerated treatment well Patient left: in chair;with call bell in  reach;with family/visitor present General Behavior During Session: [facility] for tasks performed Cognition: Impaired, at baseline  [facility] 07/18/2011, 4:01 PM

## 2011-07-18 NOTE — Progress Notes (Signed)
Night events noted. Patient noted to be unresponsive while sitting in chair. BP reportedly low. Given 500 cc normal saline bolus. BP 105 systolic this am. Patient now more with it but remains pleasantly confused.  SUBJECTIVE "ok"   1. Memory loss     Past Medical History  Diagnosis Date  . Hyperlipidemia   . Hypertension   . AF (atrial fibrillation)   . Hyperthyroidism     remotely in 1941  . Normocytic anemia     2002  . S/P TAH-BSO (total abdominal hysterectomy and bilateral salpingo-oophorectomy)   . Allergy   . Arrhythmia   . Anxiety    Current Facility-Administered Medications  Medication Dose Route Frequency Provider Last Rate Last Dose  . acetaminophen (TYLENOL) tablet 650 mg  650 mg Oral Q6H PRN Orella Cushman, MD       Or  . acetaminophen (TYLENOL) suppository 650 mg  650 mg Rectal Q6H PRN Karmyn Lowman, MD      . aspirin chewable tablet 81 mg  81 mg Oral Daily Dilyn Smiles, MD   81 mg at 07/18/11 0940  . dextrose 5 %-0.9 % sodium chloride infusion   Intravenous Continuous Aubriella Perezgarcia, MD 75 mL/hr at 07/18/11 0619    . docusate sodium (COLACE) capsule 100 mg  100 mg Oral BID Usama Harkless, MD   100 mg at 07/18/11 0939  . enoxaparin (LOVENOX) injection 30 mg  30 mg Subcutaneous Q24H Dewitt Judice, MD   30 mg at 07/17/11 2100  . ezetimibe (ZETIA) tablet 10 mg  10 mg Oral Daily Brionne Mertz, MD   10 mg at 07/18/11 0940  . feeding supplement (ENSURE CLINICAL STRENGTH) liquid 237 mL  237 mL Oral BID BM Idus Rathke, MD   237 mL at 07/18/11 0941  . mirtazapine (REMERON) tablet 7.5 mg  7.5 mg Oral QHS Emad Brechtel, MD   7.5 mg at 07/17/11 2101  . mulitivitamin with minerals tablet 1 tablet  1 tablet Oral Daily Conley Canal, MD   1 tablet at 07/18/11 0940  . ondansetron (ZOFRAN) tablet 4 mg  4 mg Oral Q6H PRN Addilee Neu, MD       Or  . ondansetron (ZOFRAN) injection 4 mg  4 mg Intravenous Q6H PRN Jillana Selph, MD      . pantoprazole (PROTONIX) EC tablet 40 mg  40 mg  Oral Q1200 Collier Bohnet, MD   40 mg at 07/17/11 2102  . piperacillin-tazobactam (ZOSYN) IVPB 3.375 g  3.375 g Intravenous Q8H Besan Ketchem, MD      . sodium chloride 0.9 % bolus 500 mL  500 mL Intravenous Once Kalief Kattner, MD   500 mL at 07/18/11 0523  . sodium chloride 0.9 % bolus 500 mL  500 mL Intravenous Once Daray Polgar, MD   500 mL at 07/18/11 0943  . vancomycin (VANCOCIN) 500 mg in sodium chloride 0.9 % 100 mL IVPB  500 mg Intravenous Q24H Ezequias Lard, MD      . DISCONTD: ALPRAZolam (XANAX) tablet 0.25 mg  0.25 mg Oral BID PRN Ehren Berisha, MD      . DISCONTD: amLODipine (NORVASC) tablet 2.5 mg  2.5 mg Oral Daily Darragh Nay, MD   2.5 mg at 07/17/11 2102  . DISCONTD: cefTRIAXone (ROCEPHIN) 1 g in dextrose 5 % 50 mL IVPB  1 g Intravenous Q24H Madeleyn Schwimmer, MD   1 g at 07/17/11 2100  . DISCONTD: metoprolol (LOPRESSOR) tablet 50 mg  50 mg Oral Daily Conley Canal, MD  50 mg at 07/17/11 2102  . DISCONTD: morphine 2 MG/ML injection 1 mg  1 mg Intravenous Q4H PRN Easton Sivertson, MD       No Known Allergies Active Problems:  Dehydration  Cystitis  Dementia  HTN (hypertension)  Chronic kidney disease (CKD), stage III (moderate)   Vital signs in last 24 hours: Temp:  [97.8 F (36.6 C)-98.4 F (36.9 C)] 97.8 F (36.6 C) (01/21 2230) Pulse Rate:  [72-95] 95  (01/22 0453) Resp:  [16-20] 18  (01/21 2230) BP: (79-156)/(60-84) 105/62 mmHg (01/22 0929) SpO2:  [94 %-99 %] 99 % (01/21 2230) Weight:  [40.1 kg (88 lb 6.5 oz)-41.295 kg (91 lb 0.6 oz)] 40.1 kg (88 lb 6.5 oz) (01/21 1617) Weight change:  Last BM Date: 07/17/11  Intake/Output from previous day: 01/21 0701 - 01/22 0700 In: 852.5 [I.V.:802.5; IV Piggyback:50] Out: 600 [Urine:600] Intake/Output this shift:    Lab Results:  Basename 07/18/11 0505 07/17/11 1820  WBC 6.6 6.9  HGB 13.1 12.6  HCT 38.8 37.2  PLT 220 227   BMET  Basename 07/18/11 0505 07/17/11 1800  NA 134* 131*  K 4.3 4.2  CL 100 95*  CO2 25  26  GLUCOSE 143* 84  BUN 29* 31*  CREATININE 1.60* 1.64*  CALCIUM 9.7 9.7    Studies/Results: Portable Chest 1 View  07/17/2011  *RADIOLOGY REPORT*  Clinical Data: Malaise.  Lethargy.  Decreased appetite. Dehydration.  PORTABLE CHEST - 1 VIEW  Comparison: 03/11/2011  Findings: Heart size and vascularity are normal and the lungs are clear of infiltrates and effusions.  Chronic interstitial and obstructive changes bilaterally.  No acute osseous abnormality.  IMPRESSION: No acute abnormality.  Chronic interstitial and obstructive lung disease, stable.  Original Report Authenticated By: Gwynn Burly, M.D.    Medications: I have reviewed the patient's current medications.   Physical exam GENERAL- alert HEAD- normal atraumatic, no neck masses, normal thyroid, no jvd RESPIRATORY- appears well, vitals normal, no respiratory distress, acyanotic, normal RR, ear and throat exam is normal, neck free of mass or lymphadenopathy, chest clear, no wheezing, crepitations, rhonchi, normal symmetric air entry CVS- regular rate and rhythm, S1, S2 normal, no murmur, click, rub or gallop ABDOMEN- abdomen is soft without significant tenderness, masses, organomegaly or guarding NEURO- follow s commands but confused without agitation. EXTREMITIES- extremities normal, atraumatic, no cyanosis or edema  Plan .Cystitis/hypotension/unresponsiveness-concern for early sepsis resulting in orthosis versus other causes of hypotension. D/C antihypertensives. Follow septic w/u. Cardiac enzymes/ct brain/carotid duplex. Change abx to vanc/zosyn till picture clearer. .Dementia- seems at baseline per relatives. Marland KitchenHTN (hypertension)- currently hypotensive. Hold home meds. .tongue lesion- address later, once acute issues addressed. Seems like not a major issue for her right now, and doubt that much would be done about it. Husband asked to leave it alone for now while addressing the acute issues.  . ckd stage 3- some bump in  bun/cr due to uti/dehydration/abx. Monitor with ivf.  DNR/DNI- husband states that they both want to go peacefully when the time comes.  Condition guarded.     Marji Kuehnel 07/18/2011 10:16 AM Pager: [phone].

## 2011-07-19 LAB — BASIC METABOLIC PANEL
CO2: 23 mEq/L (ref 19–32)
Calcium: 9.2 mg/dL (ref 8.4–10.5)
Creatinine, Ser: 1.66 mg/dL — ABNORMAL HIGH (ref 0.50–1.10)
GFR calc non Af Amer: 26 mL/min — ABNORMAL LOW (ref >90)
Glucose, Bld: 110 mg/dL — ABNORMAL HIGH (ref 70–99)
Sodium: 134 mEq/L — ABNORMAL LOW (ref 135–145)

## 2011-07-19 LAB — CBC
MCH: 31.9 pg (ref 26.0–34.0)
MCHC: 34.7 g/dL (ref 30.0–36.0)
MCV: 91.8 fL (ref 78.0–100.0)
Platelets: 182 10*3/uL (ref 150–400)
RBC: 3.42 MIL/uL — ABNORMAL LOW (ref 3.87–5.11)
RDW: 12.9 % (ref 11.5–15.5)

## 2011-07-19 LAB — CARDIAC PANEL(CRET KIN+CKTOT+MB+TROPI)
CK, MB: 1.9 ng/mL (ref 0.3–4.0)
Total CK: 41 U/L (ref 7–177)

## 2011-07-19 MED ORDER — PIPERACILLIN-TAZOBACTAM IN DEX 2-0.25 GM/50ML IV SOLN
2.2500 g | Freq: Three times a day (TID) | INTRAVENOUS | Status: DC
  Administered 2011-07-19 – 2011-07-20 (×4): 2.25 g via INTRAVENOUS
  Filled 2011-07-19 (×7): qty 50

## 2011-07-19 NOTE — Progress Notes (Signed)
PROGRESS NOTE  Leah Harper YNW:[id number] DOB: 1920/02/14 DOA: 07/17/2011 PCP: Lemont Fillers., NP, NP Nephrologist: Primitivo Gauze, M.D.  Brief narrative: 78 year old woman seen by her primary care provider the day of admission. She was referred directly for admission for lethargy, poor appetite, weight loss, depression. Several days prior to admission the patient is started on amoxicillin for a urinary tract infection. She did skip a few doses secondary to abdominal upset. She was admitted for treatment of urinary tract infection.  Patient had an episode of unresponsiveness while sitting in a chair. Blood pressure was low. Some concern was given to the possibility of sepsis.  Past medical history: Advanced dementia, hypertension, hyperlipidemia, atrial fibrillation, hypothyroidism  Consultants:  Physical therapy: Home health physical therapy. Rolling walker with 5 inch wheels.  Procedures:  January 22: Bilateral carotid artery duplex: No evidence of significant internal carotid artery stenosis. Vertebral flow antegrade.  January 22: 2-D echocardiogram: Left ventricle: Mild septal dyssynergy. The cavity size was normal. Wall thickness was increased in a pattern of mild LVH. The estimated ejection fraction was 60%.  Antibiotics:  January 21: Rocephin  January 22: Vancomycin  January 22: Zosyn  Interim History: Chart reviewed in detail. Subjective: No complaints. History is unreliable.  Objective: Filed Vitals:   07/19/11 0500 07/19/11 0501 07/19/11 0502 07/19/11 1358  BP: 181/50 172/50 175/50 147/69  Pulse: 61 60 60 57  Temp: 100.1 F (37.8 C)   98.2 F (36.8 C)  TempSrc: Oral   Oral  Resp:    16  Height:      Weight:      SpO2: 97%   96%   No intake or output data in the 24 hours ending 07/19/11 1647  Exam:  General: Appears calm and comfortable. Cardiovascular: Regular rate and rhythm. No murmur, rub or gallop. No lower extremity  edema. Respiratory: Clear to auscultation bilaterally. No wheezes, rales or rhonchi. Normal respiratory effort.  Data Reviewed: Basic Metabolic Panel:  Lab 07/19/11 6578 07/18/11 0505 07/17/11 1820 07/17/11 1800  NA 134* 134* -- 131*  K 4.2 4.3 -- --  CL 103 100 -- 95*  CO2 23 25 -- 26  GLUCOSE 110* 143* -- 84  BUN 24* 29* -- 31*  CREATININE 1.66* 1.60* -- 1.64*  CALCIUM 9.2 9.7 -- 9.7  MG -- -- 2.0 --  PHOS -- -- 3.4 --   Liver Function Tests:  Lab 07/17/11 1800  AST 23  ALT 10  ALKPHOS 78  BILITOT 0.7  PROT 7.2  ALBUMIN 3.1*   CBC:  Lab 07/19/11 0507 07/18/11 0505 07/17/11 1820 07/17/11 1800  WBC 6.3 6.6 6.9 6.6  NEUTROABS -- -- -- --  HGB 10.9* 13.1 12.6 12.4  HCT 31.4* 38.8 37.2 36.8  MCV 91.8 91.9 91.6 91.8  PLT 182 220 227 220   Cardiac Enzymes:  Lab 07/19/11 0125 07/18/11 1720 07/18/11 0953  CKTOTAL 41 51 51  CKMB 1.9 2.7 2.8  CKMBINDEX -- -- --  TROPONINI <0.30 <0.30 <0.30   CBG:  Lab 07/17/11 2237  GLUCAP 148*    Recent Results (from the past 240 hour(s))  CULTURE, BLOOD (ROUTINE X 2)     Status: Normal (Preliminary result)   Collection Time   07/17/11  7:45 PM      Component Value Range Status Comment   Specimen Description BLOOD RIGHT ARM   Final    Special Requests BOTTLES DRAWN AEROBIC AND ANAEROBIC 5CC   Final    Setup Time [id number]  Final    Culture     Final    Value:        BLOOD CULTURE RECEIVED NO GROWTH TO DATE CULTURE WILL BE HELD FOR 5 DAYS BEFORE ISSUING A FINAL NEGATIVE REPORT   Report Status PENDING   Incomplete   CULTURE, BLOOD (ROUTINE X 2)     Status: Normal (Preliminary result)   Collection Time   07/17/11  7:50 PM      Component Value Range Status Comment   Specimen Description BLOOD RIGHT ARM   Final    Special Requests BOTTLES DRAWN AEROBIC AND ANAEROBIC 2CC   Final    Setup Time [id number]   Final    Culture     Final    Value:        BLOOD CULTURE RECEIVED NO GROWTH TO DATE CULTURE WILL BE HELD FOR 5 DAYS  BEFORE ISSUING A FINAL NEGATIVE REPORT   Report Status PENDING   Incomplete   URINE CULTURE     Status: Normal (Preliminary result)   Collection Time   07/18/11  6:52 AM      Component Value Range Status Comment   Specimen Description URINE, CLEAN CATCH   Final    Special Requests NONE   Final    Setup Time [id number]   Final    Colony Count 15,000 COLONIES/ML   Final    Culture PSEUDOMONAS AERUGINOSA   Final    Report Status PENDING   Incomplete      Studies: Ct Head Wo Contrast  07/18/2011  *RADIOLOGY REPORT*  Clinical Data: Syncope.  CT HEAD WITHOUT CONTRAST  Technique:  Contiguous axial images were obtained from the base of the skull through the vertex without contrast.  Comparison: None.  Findings: There is no evidence of intracranial hemorrhage, brain edema or other signs of acute infarction.  There is no evidence of intracranial mass lesion or mass effect.  No abnormal extra-axial fluid collections are identified.  Mild generalized cerebral atrophy noted.  No evidence of hydrocephalus.  No other intracranial abnormality identified.  No skull abnormality seen.  IMPRESSION:  1.  No acute intracranial abnormality. 2.  Mild cerebral atrophy.  Original Report Authenticated By: Danae Orleans, M.D.   Portable Chest 1 View  07/17/2011  *RADIOLOGY REPORT*  Clinical Data: Malaise.  Lethargy.  Decreased appetite. Dehydration.  PORTABLE CHEST - 1 VIEW  Comparison: 03/11/2011  Findings: Heart size and vascularity are normal and the lungs are clear of infiltrates and effusions.  Chronic interstitial and obstructive changes bilaterally.  No acute osseous abnormality.  IMPRESSION: No acute abnormality.  Chronic interstitial and obstructive lung disease, stable.  Original Report Authenticated By: Gwynn Burly, M.D.    Scheduled Meds:   . aspirin  81 mg Oral Daily  . docusate sodium  100 mg Oral BID  . enoxaparin  30 mg Subcutaneous Q24H  . ezetimibe  10 mg Oral Daily  . feeding supplement  237  mL Oral BID BM  . mirtazapine  7.5 mg Oral QHS  . mulitivitamin with minerals  1 tablet Oral Daily  . pantoprazole  40 mg Oral Q1200  . piperacillin-tazobactam (ZOSYN)  IV  2.25 g Intravenous Q8H  . vancomycin  500 mg Intravenous Q24H  . DISCONTD: piperacillin-tazobactam (ZOSYN)  IV  3.375 g Intravenous Q8H   Continuous Infusions:   . dextrose 5 % and 0.9% NaCl 75 mL/hr at 07/18/11 8413     Assessment/Plan: 1. Urinary tract infection: Pseudomonas aeruginosa. Continue  Zosyn. Discontinue vancomycin. Cardiac enzymes negative. CT head negative.  2. Hypotension: Probably related to orthostasis, antihypertensives and dehydration. Doubt sepsis.  3. Unresponsive episode was probably orthostatic in nature. Husband does not desire any further evaluation including no MRI. 4. Acute renal failure superimposed on chronic kidney disease stage III: Improving. Continue IV fluids. 5. Dehydration: Improving. 6. Advanced dementia: Stable. 7. Hypertension: Stable. 8. Mouth lesion: Torus palatini. Benign. No further evaluation is warned that. 9. Known atrial fibrillation: Stable.  Code Status: DO NOT RESUSCITATE Family Communication: Discussed in detail with husband at bedside. Disposition Plan: Home 48 hours.   Brendia Sacks, MD  [facility] Pager [phone] 07/19/2011, 4:47 PM    LOS: 2 days

## 2011-07-19 NOTE — Progress Notes (Signed)
Patient's spouse has many questions regarding the plan of care, the rounding practices, and would like to see/speak with the doctor.  He is leaving hospital now and will return at 1pm.  Paged Dr. Irene Limbo to advise of the questions and the request to see and speak with him.  Gave Dr. Irene Limbo the husbands phone number also in case he would like to phone before his return

## 2011-07-19 NOTE — Progress Notes (Signed)
ANTIBIOTIC CONSULT NOTE - Follow-Up  Pharmacy Consult for Vancomycin/Zosyn Indication: UTI, r/o sepsis  No Known Allergies  Patient Measurements: Height: 5\' 4"  (162.6 cm) Weight: 88 lb 6.5 oz (40.1 kg) IBW/kg (Calculated) : 54.7   Vital Signs: Temp: 100.1 F (37.8 C) (01/23 0500) Temp src: Oral (01/23 0500) BP: 175/50 mmHg (01/23 0502) Pulse Rate: 60  (01/23 0502) Intake/Output from previous day: 01/22 0701 - 01/23 0700 In: 240 [P.O.:240] Out: -  Intake/Output from this shift:    Labs:  Basename 07/19/11 0507 07/19/11 0122 07/18/11 0505 07/17/11 1820 07/17/11 1800  WBC 6.3 -- 6.6 6.9 --  HGB 10.9* -- 13.1 12.6 --  PLT 182 -- 220 227 --  LABCREA -- -- -- -- --  CREATININE -- 1.66* 1.60* -- 1.64*   Estimated Creatinine Clearance: 14.5 ml/min (by C-G formula based on Cr of 1.66). No results found for this basename: VANCOTROUGH:2,VANCOPEAK:2,VANCORANDOM:2,GENTTROUGH:2,GENTPEAK:2,GENTRANDOM:2,TOBRATROUGH:2,TOBRAPEAK:2,TOBRARND:2,AMIKACINPEAK:2,AMIKACINTROU:2,AMIKACIN:2, in the last 72 hours   Microbiology: Recent Results (from the past 720 hour(s))  URINE CULTURE     Status: Normal (Preliminary result)   Collection Time   07/18/11  6:52 AM      Component Value Range Status Comment   Specimen Description URINE, CLEAN CATCH   Final    Special Requests NONE   Final    Setup Time [id number]   Final    Colony Count 15,000 COLONIES/ML   Final    Culture PSEUDOMONAS AERUGINOSA   Final    Report Status PENDING   Incomplete     Medical History: Past Medical History  Diagnosis Date  . Hyperlipidemia   . Hypertension   . AF (atrial fibrillation)   . Hyperthyroidism     remotely in 1941  . Normocytic anemia     2002  . S/P TAH-BSO (total abdominal hysterectomy and bilateral salpingo-oophorectomy)   . Allergy   . Arrhythmia   . Anxiety     Medications:  Scheduled:     . aspirin  81 mg Oral Daily  . docusate sodium  100 mg Oral BID  . enoxaparin  30 mg  Subcutaneous Q24H  . ezetimibe  10 mg Oral Daily  . feeding supplement  237 mL Oral BID BM  . mirtazapine  7.5 mg Oral QHS  . mulitivitamin with minerals  1 tablet Oral Daily  . pantoprazole  40 mg Oral Q1200  . piperacillin-tazobactam (ZOSYN)  IV  2.25 g Intravenous Q8H  . sodium chloride  500 mL Intravenous Once  . vancomycin  500 mg Intravenous Q24H  . DISCONTD: amLODipine  2.5 mg Oral Daily  . DISCONTD: cefTRIAXone (ROCEPHIN)  IV  1 g Intravenous Q24H  . DISCONTD: metoprolol  50 mg Oral Daily  . DISCONTD: piperacillin-tazobactam (ZOSYN)  IV  3.375 g Intravenous Q8H   Anti-infectives     Start     Dose/Rate Route Frequency Ordered Stop   07/19/11 1200   piperacillin-tazobactam (ZOSYN) IVPB 2.25 g        2.25 g 100 mL/hr over 30 Minutes Intravenous 3 times per day 07/19/11 0639     07/18/11 1200   piperacillin-tazobactam (ZOSYN) IVPB 3.375 g  Status:  Discontinued        3.375 g 12.5 mL/hr over 240 Minutes Intravenous Every 8 hours 07/18/11 0953 07/19/11 0639   07/18/11 1100   vancomycin (VANCOCIN) 500 mg in sodium chloride 0.9 % 100 mL IVPB        500 mg 100 mL/hr over 60 Minutes Intravenous Every 24 hours 07/18/11  1191     07/17/11 2000   cefTRIAXone (ROCEPHIN) 1 g in dextrose 5 % 50 mL IVPB  Status:  Discontinued        1 g 100 mL/hr over 30 Minutes Intravenous Every 24 hours 07/17/11 1811 07/18/11 0908         Assessment:  78 y/o F admitted with UTI, r/o urosepsis  Urine culture growing 15K Pseudomonas aeruginosa, susceptibilities pending.  Blood cultures x 2 pending.  SCr drifting up, CrCl <79mL/min   Plan:  1.) Reduce Zosyn to 2.25 grams IV q8h for CrCl < 15mL/min 2.) Continue same Vancomycin dosage for now. 3.) Follow serum creatinine.  If Vancomycin continues, check trough at steady-state. 4.) Follow-up on final culture results.   Elie Goody, Pharm.D.  [phone] 07/19/2011 7:46 AM

## 2011-07-20 LAB — BASIC METABOLIC PANEL
CO2: 26 mEq/L (ref 19–32)
Calcium: 8.7 mg/dL (ref 8.4–10.5)
Creatinine, Ser: 1.84 mg/dL — ABNORMAL HIGH (ref 0.50–1.10)
GFR calc Af Amer: 27 mL/min — ABNORMAL LOW (ref >90)
GFR calc non Af Amer: 23 mL/min — ABNORMAL LOW (ref >90)
Sodium: 136 mEq/L (ref 135–145)

## 2011-07-20 LAB — CREATININE, SERUM
Creatinine, Ser: 1.72 mg/dL — ABNORMAL HIGH (ref 0.50–1.10)
GFR calc non Af Amer: 25 mL/min — ABNORMAL LOW (ref >90)

## 2011-07-20 MED ORDER — CIPROFLOXACIN HCL 500 MG PO TABS
500.0000 mg | ORAL_TABLET | Freq: Every day | ORAL | Status: DC
  Administered 2011-07-20 – 2011-07-21 (×2): 500 mg via ORAL
  Filled 2011-07-20 (×2): qty 1

## 2011-07-20 MED ORDER — HEPARIN SODIUM (PORCINE) 5000 UNIT/ML IJ SOLN
5000.0000 [IU] | Freq: Three times a day (TID) | INTRAMUSCULAR | Status: DC
  Administered 2011-07-20 – 2011-07-21 (×2): 5000 [IU] via SUBCUTANEOUS
  Filled 2011-07-20 (×5): qty 1

## 2011-07-20 NOTE — Progress Notes (Signed)
NURSING  Pt family request dietician consult. Has concerns with food intake.    Leah Harper 07/20/2011 12:14 PM

## 2011-07-20 NOTE — Progress Notes (Signed)
PROGRESS NOTE  Leah Harper RUE:[id number] DOB: [birth year] DOA: 07/17/2011 PCP: Lemont Fillers., NP, NP Nephrologist: Primitivo Gauze, M.D.  Brief narrative: 78 year old woman seen by her primary care provider the day of admission. She was referred directly for admission for lethargy, poor appetite, weight loss, depression. Several days prior to admission the patient is started on amoxicillin for a urinary tract infection. She did skip a few doses secondary to abdominal upset. She was admitted for treatment of urinary tract infection.  Patient had an episode of unresponsiveness while sitting in a chair. Blood pressure was low. Some concern was given to the possibility of sepsis.  Past medical history: Advanced dementia, hypertension, hyperlipidemia, atrial fibrillation, hypothyroidism  Consultants:  Physical therapy: Skilled nursing facility. Rolling walker with 5 inch wheels.  Nutrition  Procedures:  January 22: Bilateral carotid artery duplex: No evidence of significant internal carotid artery stenosis. Vertebral flow antegrade.  January 22: 2-D echocardiogram: Left ventricle: Mild septal dyssynergy. The cavity size was normal. Wall thickness was increased in a pattern of mild LVH. The estimated ejection fraction was 60%.  Antibiotics:  January 21-21: Rocephin  January 22-23: Vancomycin  January 22-24: Zosyn  January 24: Ciprofloxacin  Interim History: Felt "strange" during physical therapy. Was reported by son to have had low blood pressure at that time however I do not see this recorded. Subjective: No complaints. History is unreliable. Husband notes that labile blood pressure has been an issue for some time. Patient herself denies complaints but her history is unreliable.  Objective: Filed Vitals:   07/20/11 0530 07/20/11 0532 07/20/11 0535 07/20/11 0730  BP: 150/55 176/129 129/100 114/63  Pulse: 51 63 90   Temp: 97.6 F (36.4 C)     TempSrc: Oral       Resp: 16     Height:      Weight:      SpO2: 98% 99% 97%     Intake/Output Summary (Last 24 hours) at 07/20/11 1513 Last data filed at 07/20/11 1478  Gross per 24 hour  Intake   2995 ml  Output   1000 ml  Net   1995 ml    Exam:  General: Appears calm and comfortable. Cardiovascular: Regular rate and rhythm. No murmur, rub or gallop. No lower extremity edema. Respiratory: Clear to auscultation bilaterally. No wheezes, rales or rhonchi. Normal respiratory effort.  Data Reviewed: Basic Metabolic Panel:  Lab 07/20/11 2956 07/20/11 0505 07/19/11 0122 07/18/11 0505 07/17/11 1820 07/17/11 1800  NA -- 136 134* 134* -- 131*  K -- 3.3* 4.2 -- -- --  CL -- 104 103 100 -- 95*  CO2 -- 26 23 25  -- 26  GLUCOSE -- 114* 110* 143* -- 84  BUN -- 16 24* 29* -- 31*  CREATININE 1.72* 1.84* 1.66* 1.60* -- 1.64*  CALCIUM -- 8.7 9.2 9.7 -- 9.7  MG -- -- -- -- 2.0 --  PHOS -- -- -- -- 3.4 --   Liver Function Tests:  Lab 07/17/11 1800  AST 23  ALT 10  ALKPHOS 78  BILITOT 0.7  PROT 7.2  ALBUMIN 3.1*   CBC:  Lab 07/19/11 0507 07/18/11 0505 07/17/11 1820 07/17/11 1800  WBC 6.3 6.6 6.9 6.6  NEUTROABS -- -- -- --  HGB 10.9* 13.1 12.6 12.4  HCT 31.4* 38.8 37.2 36.8  MCV 91.8 91.9 91.6 91.8  PLT 182 220 227 220   Cardiac Enzymes:  Lab 07/19/11 0125 07/18/11 1720 07/18/11 0953  CKTOTAL 41 51 51  CKMB 1.9  2.7 2.8  CKMBINDEX -- -- --  TROPONINI <0.30 <0.30 <0.30   CBG:  Lab 07/17/11 2237  GLUCAP 148*    Recent Results (from the past 240 hour(s))  CULTURE, BLOOD (ROUTINE X 2)     Status: Normal (Preliminary result)   Collection Time   07/17/11  7:45 PM      Component Value Range Status Comment   Specimen Description BLOOD RIGHT ARM   Final    Special Requests BOTTLES DRAWN AEROBIC AND ANAEROBIC 5CC   Final    Setup Time [id number]   Final    Culture     Final    Value:        BLOOD CULTURE RECEIVED NO GROWTH TO DATE CULTURE WILL BE HELD FOR 5 DAYS BEFORE ISSUING A FINAL  NEGATIVE REPORT   Report Status PENDING   Incomplete   CULTURE, BLOOD (ROUTINE X 2)     Status: Normal (Preliminary result)   Collection Time   07/17/11  7:50 PM      Component Value Range Status Comment   Specimen Description BLOOD RIGHT ARM   Final    Special Requests BOTTLES DRAWN AEROBIC AND ANAEROBIC 2CC   Final    Setup Time [id number]   Final    Culture     Final    Value:        BLOOD CULTURE RECEIVED NO GROWTH TO DATE CULTURE WILL BE HELD FOR 5 DAYS BEFORE ISSUING A FINAL NEGATIVE REPORT   Report Status PENDING   Incomplete   URINE CULTURE     Status: Normal   Collection Time   07/18/11  6:52 AM      Component Value Range Status Comment   Specimen Description URINE, CLEAN CATCH   Final    Special Requests NONE   Final    Setup Time [id number]   Final    Colony Count 15,000 COLONIES/ML   Final    Culture PSEUDOMONAS AERUGINOSA   Final    Report Status 07/20/2011 FINAL   Final    Organism ID, Bacteria PSEUDOMONAS AERUGINOSA   Final      Studies: Ct Head Wo Contrast  07/18/2011  *RADIOLOGY REPORT*  Clinical Data: Syncope.  CT HEAD WITHOUT CONTRAST  Technique:  Contiguous axial images were obtained from the base of the skull through the vertex without contrast.  Comparison: None.  Findings: There is no evidence of intracranial hemorrhage, brain edema or other signs of acute infarction.  There is no evidence of intracranial mass lesion or mass effect.  No abnormal extra-axial fluid collections are identified.  Mild generalized cerebral atrophy noted.  No evidence of hydrocephalus.  No other intracranial abnormality identified.  No skull abnormality seen.  IMPRESSION:  1.  No acute intracranial abnormality. 2.  Mild cerebral atrophy.  Original Report Authenticated By: Danae Orleans, M.D.   Portable Chest 1 View  07/17/2011  *RADIOLOGY REPORT*  Clinical Data: Malaise.  Lethargy.  Decreased appetite. Dehydration.  PORTABLE CHEST - 1 VIEW  Comparison: 03/11/2011  Findings: Heart size  and vascularity are normal and the lungs are clear of infiltrates and effusions.  Chronic interstitial and obstructive changes bilaterally.  No acute osseous abnormality.  IMPRESSION: No acute abnormality.  Chronic interstitial and obstructive lung disease, stable.  Original Report Authenticated By: Gwynn Burly, M.D.    Scheduled Meds:    . aspirin  81 mg Oral Daily  . docusate sodium  100 mg Oral BID  . enoxaparin  30 mg Subcutaneous Q24H  . ezetimibe  10 mg Oral Daily  . feeding supplement  237 mL Oral BID BM  . mirtazapine  7.5 mg Oral QHS  . mulitivitamin with minerals  1 tablet Oral Daily  . pantoprazole  40 mg Oral Q1200  . piperacillin-tazobactam (ZOSYN)  IV  2.25 g Intravenous Q8H  . DISCONTD: vancomycin  500 mg Intravenous Q24H   Continuous Infusions:    . dextrose 5 % and 0.9% NaCl 75 mL/hr at 07/18/11 9604     Assessment/Plan: 1. Urinary tract infection: Pseudomonas aeruginosa sensitive to ciprofloxacin. Discontinue Zosyn. Start Cipro. Cardiac enzymes negative. CT head negative.  2. Hypotension: Probably related to orthostasis, antihypertensives and dehydration. Doubt sepsis. Remains resolved. 3. Unresponsive episode was probably orthostatic in nature. Husband does not desire any further evaluation including no MRI. 4. Acute renal failure superimposed on chronic kidney disease stage III: Improving/stable. Continue IV fluids. 5. Dehydration: Appears resolved. 6. Advanced dementia: Stable. 7. Hypertension: Stable. May need to avoid antihypertensives or at least allow permissive hypertension. 8. Mouth lesion: Torus palatini. Benign. No further evaluation is warned. 9. Known atrial fibrillation: Stable. 10. Severe malnutrition in the context of acute illness.  Code Status: DO NOT RESUSCITATE Family Communication: Discussed in detail with husband and son at bedside. Disposition Plan: Home tomorrow.   Brendia Sacks, MD  [facility] Pager  [phone] 07/20/2011, 3:13 PM    LOS: 3 days

## 2011-07-20 NOTE — Progress Notes (Signed)
INITIAL ADULT NUTRITION ASSESSMENT Date: 07/20/2011   Time: 2:35 PM Reason for Assessment: RN/family request for poor intake  ASSESSMENT: Female 78 y.o.  Dx: General malaise, dehydrated  Hx:  Past Medical History  Diagnosis Date  . Hyperlipidemia   . Hypertension   . AF (atrial fibrillation)   . Hyperthyroidism     remotely in 1941  . Normocytic anemia     2002  . S/P TAH-BSO (total abdominal hysterectomy and bilateral salpingo-oophorectomy)   . Allergy   . Arrhythmia   . Anxiety    Related Meds:  Scheduled Meds:   . aspirin  81 mg Oral Daily  . docusate sodium  100 mg Oral BID  . enoxaparin  30 mg Subcutaneous Q24H  . ezetimibe  10 mg Oral Daily  . feeding supplement  237 mL Oral BID BM  . mirtazapine  7.5 mg Oral QHS  . mulitivitamin with minerals  1 tablet Oral Daily  . pantoprazole  40 mg Oral Q1200  . piperacillin-tazobactam (ZOSYN)  IV  2.25 g Intravenous Q8H  . DISCONTD: vancomycin  500 mg Intravenous Q24H   Continuous Infusions:   . dextrose 5 % and 0.9% NaCl 75 mL/hr at 07/18/11 0619   PRN Meds:.acetaminophen, acetaminophen, ondansetron (ZOFRAN) IV, ondansetron  Ht: 5\' 4"  (162.6 cm)  Wt: 88 lb 6.5 oz (40.1 kg)  Ideal Wt: 54.5kg % Ideal Wt: 73  Usual Wt: Family unsure % Usual Wt: Family unsure  Body mass index is 15.17 kg/(m^2).  Food/Nutrition Related Hx: Pt with advanced dementia, eating lunch during visit, son present at bedside. Pt appears frail and significantly thin at 88 pounds. Son reports pt has had poor appetite for the past week. Son reports pt drinks Ensure at home. Son denies that pt has any problems chewing/swallowing food. Noted night events of pt becoming unresponsive. Pt had eaten >50% of lunch tray by the end of visit.   Labs:  CMP     Component Value Date/Time   NA 136 07/20/2011 0505   K 3.3* 07/20/2011 0505   CL 104 07/20/2011 0505   CO2 26 07/20/2011 0505   GLUCOSE 114* 07/20/2011 0505   BUN 16 07/20/2011 0505   CREATININE  1.72* 07/20/2011 0842   CREATININE 1.88* 10/05/2010 1500   CALCIUM 8.7 07/20/2011 0505   PROT 7.2 07/17/2011 1800   ALBUMIN 3.1* 07/17/2011 1800   AST 23 07/17/2011 1800   ALT 10 07/17/2011 1800   ALKPHOS 78 07/17/2011 1800   BILITOT 0.7 07/17/2011 1800   GFRNONAA 25* 07/20/2011 0842   GFRAA 29* 07/20/2011 0842    Intake/Output Summary (Last 24 hours) at 07/20/11 1447 Last data filed at 07/20/11 2956  Gross per 24 hour  Intake   2995 ml  Output   1000 ml  Net   1995 ml   Last BM - 07/17/11  Diet Order: Cardiac  Supplements/Tube Feeding: Ensure Clinical Strength BID  IVF:    dextrose 5 % and 0.9% NaCl Last Rate: 75 mL/hr at 07/18/11 0619    Estimated Nutritional Needs:   Kcal:1650-1900 Protein:75-85g Fluid:1.6-1.9L  NUTRITION DIAGNOSIS: -Predicted suboptimal energy intake (NI-1.6).  Status: Ongoing -Pt meets criteria for severe PCM of acute illness AEB <50% energy needs in the past week, BMI of 15.17, and thin cachetic extremities   RELATED TO: poor appetite PTA  AS EVIDENCE BY: family statement  MONITORING/EVALUATION(Goals): Pt to consume >75% of meals/supplements.   EDUCATION NEEDS: -Education needs addressed - son stated his father wanted a list  of foods pt should eat for weight gain and strength building. Provided son with a list of high calorie/protein foods and discussed ways to maximize nutrition with sample meal and snack ideas. RD contact information provided.   INTERVENTION: Encouraged increased intake. Will monitor.   Dietitian #: [phone]  DOCUMENTATION CODES Per approved criteria  -Severe malnutrition in the context of acute illness or injury    Marshall Cork 07/20/2011, 2:35 PM

## 2011-07-20 NOTE — Progress Notes (Signed)
Physical Therapy Treatment Patient Details Name: Leah Harper MRN: [id number] DOB: Sep 19, 1919 Today's Date: 07/20/2011 Time: 4098-1191 Charge: Leonia Reeves PT Assessment/Plan  PT - Assessment/Plan Comments on Treatment Session: Pt with "funny" feeling in head with ambulation but unable to describe well.  Recommend [facility] upon d/c 2* feel spouse would not be able to provide care for pt in event of similar situation during gait at home or fall.  Son in room and in agreement for placement.  Pt seems lethargic today. PT Plan: Discharge plan needs to be updated Follow Up Recommendations: Skilled nursing facility;Supervision/Assistance - 24 hour Equipment Recommended: Rolling walker with 5" wheels Pt also reported R thigh pain describing pain as sharp and shooting but declined pain meds upon returning to room.  Notified RN. PT Goals  Acute Rehab PT Goals PT Goal: Supine/Side to Sit - Progress: Progressing toward goal PT Goal: Sit to Stand - Progress: Progressing toward goal PT Goal: Stand to Sit - Progress: Progressing toward goal Pt will Ambulate: >150 feet;with supervision;with rolling walker PT Goal: Ambulate - Progress: Goal set today  PT Treatment Precautions/Restrictions  Precautions Precautions: Fall Restrictions Weight Bearing Restrictions:  (entered at 0923, info moved to 0900 column) Mobility (including Balance) Bed Mobility Bed Mobility: Yes Supine to Sit: 5: Supervision Supine to Sit Details (indicate cue type and reason): cues for technique Sitting - Scoot to Edge of Bed: 5: Supervision Sitting - Scoot to [place] of Bed Details (indicate cue type and reason): cue to bring feet to floor Transfers Transfers: Yes Sit to Stand: 4: Min assist;From chair/3-in-1;From bed Sit to Stand Details (indicate cue type and reason): increased assist for when pt felt "funny" with ambulation Stand to Sit: 4: Min assist;To bed;To chair/3-in-1 Stand to Sit Details: increased assist for when pt felt  "funny" with ambulation Ambulation/Gait Ambulation/Gait: Yes Ambulation/Gait Assistance: 4: Min assist Ambulation/Gait Assistance Details (indicate cue type and reason): min/guard with increased verbal cues for safe RW distance until pt became slower and less talkative and when questioned said yes "feel funny" so brought chair behind pt and vitals 118/59mmHg, 99%, and 77 bpm, pt's recliner then brought to pt and pt switched to her recliner with assist and brought back to room Ambulation Distance (Feet): 150 Feet Assistive device: Rolling walker Gait Pattern: Decreased stride length;Trunk flexed    Exercise    End of Session PT - End of Session Activity Tolerance: Other (comment) (not feeling well with ambulation) Patient left: in chair;with call bell in reach;with family/visitor present Nurse Communication:  (vitals with ambulation) General Behavior During Session: Lethargic Cognition: Impaired, at baseline  Sundi Slevin,KATHrine E 07/20/2011, 1:29 PM Pager: [phone]

## 2011-07-21 MED ORDER — CIPROFLOXACIN HCL 500 MG PO TABS: 500.0000 mg | ORAL_TABLET | Freq: Every day | ORAL | Status: DC

## 2011-07-21 MED ORDER — POLYETHYLENE GLYCOL 3350 17 G PO PACK: 17.0000 g | PACK | Freq: Every day | ORAL | Status: AC

## 2011-07-21 MED ORDER — POLYETHYLENE GLYCOL 3350 17 G PO PACK
17.0000 g | PACK | Freq: Every day | ORAL | Status: DC
  Filled 2011-07-21: qty 1

## 2011-07-21 MED ORDER — DSS 100 MG PO CAPS: 100.0000 mg | ORAL_CAPSULE | Freq: Two times a day (BID) | ORAL | Status: DC

## 2011-07-21 NOTE — Progress Notes (Signed)
PROGRESS NOTE  Leah Harper AVW:[id number] DOB: 03/18/1920 DOA: 07/17/2011 PCP: Lemont Fillers., NP, NP Nephrologist: Primitivo Gauze, M.D. Cardiologist: Charlton Haws, M.D.  Brief narrative: 78 year old woman seen by her primary care provider the day of admission. She was referred directly for admission for lethargy, poor appetite, weight loss, depression. Several days prior to admission the patient is started on amoxicillin for a urinary tract infection. She did skip a few doses secondary to abdominal upset. She was admitted for treatment of urinary tract infection.  Past medical history: Advanced dementia, hypertension, hyperlipidemia, atrial fibrillation, hypothyroidism  Consultants:  Physical therapy: Skilled nursing facility. Rolling walker with 5 inch wheels.  Nutrition  Procedures:  January 22: Bilateral carotid artery duplex: No evidence of significant internal carotid artery stenosis. Vertebral flow antegrade.  January 22: 2-D echocardiogram: Left ventricle: Mild septal dyssynergy. The cavity size was normal. Wall thickness was increased in a pattern of mild LVH. The estimated ejection fraction was 60%.  Antibiotics:  January 21-21: Rocephin  January 22-23: Vancomycin  January 22-24: Zosyn  January 24: Ciprofloxacin  Interim History: No interval documentation. Subjective: Patient has no complaints. Husband has no concerns at this point.  Objective: Filed Vitals:   07/20/11 2200 07/21/11 0559 07/21/11 0700 07/21/11 0900  BP: 136/66 162/71 153/67 154/67  Pulse: 73 62 66 76  Temp: 97.2 F (36.2 C) 98.2 F (36.8 C)  97.3 F (36.3 C)  TempSrc: Oral Oral  Oral  Resp: 18 18  18   Height:      Weight:      SpO2: 96% 96%  99%    Intake/Output Summary (Last 24 hours) at 07/21/11 1012 Last data filed at 07/21/11 0930  Gross per 24 hour  Intake    480 ml  Output    700 ml  Net   -220 ml    Exam:  General: Appears calm and  comfortable. Cardiovascular: Regular rate and rhythm. No murmur, rub or gallop. No lower extremity edema. Respiratory: Clear to auscultation bilaterally. No wheezes, rales or rhonchi. Normal respiratory effort.  Data Reviewed: Basic Metabolic Panel:  Lab 07/20/11 8295 07/20/11 0505 07/19/11 0122 07/18/11 0505 07/17/11 1820 07/17/11 1800  NA -- 136 134* 134* -- 131*  K -- 3.3* 4.2 -- -- --  CL -- 104 103 100 -- 95*  CO2 -- 26 23 25  -- 26  GLUCOSE -- 114* 110* 143* -- 84  BUN -- 16 24* 29* -- 31*  CREATININE 1.72* 1.84* 1.66* 1.60* -- 1.64*  CALCIUM -- 8.7 9.2 9.7 -- 9.7  MG -- -- -- -- 2.0 --  PHOS -- -- -- -- 3.4 --   Liver Function Tests:  Lab 07/17/11 1800  AST 23  ALT 10  ALKPHOS 78  BILITOT 0.7  PROT 7.2  ALBUMIN 3.1*   CBC:  Lab 07/19/11 0507 07/18/11 0505 07/17/11 1820 07/17/11 1800  WBC 6.3 6.6 6.9 6.6  NEUTROABS -- -- -- --  HGB 10.9* 13.1 12.6 12.4  HCT 31.4* 38.8 37.2 36.8  MCV 91.8 91.9 91.6 91.8  PLT 182 220 227 220   Cardiac Enzymes:  Lab 07/19/11 0125 07/18/11 1720 07/18/11 0953  CKTOTAL 41 51 51  CKMB 1.9 2.7 2.8  CKMBINDEX -- -- --  TROPONINI <0.30 <0.30 <0.30   CBG:  Lab 07/17/11 2237  GLUCAP 148*    Recent Results (from the past 240 hour(s))  CULTURE, BLOOD (ROUTINE X 2)     Status: Normal (Preliminary result)   Collection Time  07/17/11  7:45 PM      Component Value Range Status Comment   Specimen Description BLOOD RIGHT ARM   Final    Special Requests BOTTLES DRAWN AEROBIC AND ANAEROBIC 5CC   Final    Setup Time [id number]   Final    Culture     Final    Value:        BLOOD CULTURE RECEIVED NO GROWTH TO DATE CULTURE WILL BE HELD FOR 5 DAYS BEFORE ISSUING A FINAL NEGATIVE REPORT   Report Status PENDING   Incomplete   CULTURE, BLOOD (ROUTINE X 2)     Status: Normal (Preliminary result)   Collection Time   07/17/11  7:50 PM      Component Value Range Status Comment   Specimen Description BLOOD RIGHT ARM   Final    Special Requests  BOTTLES DRAWN AEROBIC AND ANAEROBIC 2CC   Final    Setup Time [id number]   Final    Culture     Final    Value:        BLOOD CULTURE RECEIVED NO GROWTH TO DATE CULTURE WILL BE HELD FOR 5 DAYS BEFORE ISSUING A FINAL NEGATIVE REPORT   Report Status PENDING   Incomplete   URINE CULTURE     Status: Normal   Collection Time   07/18/11  6:52 AM      Component Value Range Status Comment   Specimen Description URINE, CLEAN CATCH   Final    Special Requests NONE   Final    Setup Time [id number]   Final    Colony Count 15,000 COLONIES/ML   Final    Culture PSEUDOMONAS AERUGINOSA   Final    Report Status 07/20/2011 FINAL   Final    Organism ID, Bacteria PSEUDOMONAS AERUGINOSA   Final      Studies: Ct Head Wo Contrast  07/18/2011  *RADIOLOGY REPORT*  Clinical Data: Syncope.  CT HEAD WITHOUT CONTRAST  Technique:  Contiguous axial images were obtained from the base of the skull through the vertex without contrast.  Comparison: None.  Findings: There is no evidence of intracranial hemorrhage, brain edema or other signs of acute infarction.  There is no evidence of intracranial mass lesion or mass effect.  No abnormal extra-axial fluid collections are identified.  Mild generalized cerebral atrophy noted.  No evidence of hydrocephalus.  No other intracranial abnormality identified.  No skull abnormality seen.  IMPRESSION:  1.  No acute intracranial abnormality. 2.  Mild cerebral atrophy.  Original Report Authenticated By: Danae Orleans, M.D.   Portable Chest 1 View  07/17/2011  *RADIOLOGY REPORT*  Clinical Data: Malaise.  Lethargy.  Decreased appetite. Dehydration.  PORTABLE CHEST - 1 VIEW  Comparison: 03/11/2011  Findings: Heart size and vascularity are normal and the lungs are clear of infiltrates and effusions.  Chronic interstitial and obstructive changes bilaterally.  No acute osseous abnormality.  IMPRESSION: No acute abnormality.  Chronic interstitial and obstructive lung disease, stable.  Original  Report Authenticated By: Gwynn Burly, M.D.    Scheduled Meds:    . aspirin  81 mg Oral Daily  . ciprofloxacin  500 mg Oral Daily  . docusate sodium  100 mg Oral BID  . ezetimibe  10 mg Oral Daily  . feeding supplement  237 mL Oral BID BM  . heparin subcutaneous  5,000 Units Subcutaneous Q8H  . mirtazapine  7.5 mg Oral QHS  . mulitivitamin with minerals  1 tablet Oral Daily  . pantoprazole  40 mg Oral Q1200  . DISCONTD: enoxaparin  30 mg Subcutaneous Q24H  . DISCONTD: piperacillin-tazobactam (ZOSYN)  IV  2.25 g Intravenous Q8H   Continuous Infusions:     Assessment/Plan: 1. Urinary tract infection: Pseudomonas aeruginosa sensitive to ciprofloxacin. 2. Hypotension: Probably related to orthostasis, antihypertensives and dehydration. Doubt sepsis. Remains resolved. 3. Unresponsive episode was probably orthostatic in nature. Husband does not desire any further evaluation including no MRI. 4. Acute renal failure superimposed on chronic kidney disease stage III: Improving/stable. Followup as an outpatient. 5. Dehydration: Appears resolved. 6. Advanced dementia: Stable. 7. Hypertension: Stable. Stop Norvasc given probable intermittent orthostasis. Case discussed with Dr. Eden Emms who agrees. 8. Known atrial fibrillation: Stable. Resume beta blocker on discharge. 9. Severe malnutrition in the context of acute illness.  Code Status: DO NOT RESUSCITATE Family Communication: Discussed with husband at bedside today. All questions answered to his apparent satisfaction. Disposition Plan: Home today with home health.   Brendia Sacks, MD  [facility] Pager [phone] 07/21/2011, 10:12 AM    LOS: 4 days

## 2011-07-21 NOTE — Progress Notes (Signed)
Talked to patient with spouse present about dcp; plan to return home at discharge with home health care. List of HHC agencies given to the spouse/ pt; patient and spouse chose [facility]. Norberta Keens RN with [facility] called for arrangements. No DME needed per spouse; Abelino Derrick RN, BSN, [place]

## 2011-07-21 NOTE — Progress Notes (Signed)
CSW requested by Dr. Irene Limbo to complete an FL-2 for the pt. Pt is returning home with her husband and will be followed by [facility]. Dr. Irene Limbo wanted the FL-2 in place just in case the pts family feels like the care is too much and the pt has to go to a skilled level of care.  CSW had the physician sign the document, and the pts RN Annabelle Harman was going to present the document to the pt and her family.  Golden Pop 07/21/2011 11:33 AM [phone]

## 2011-07-21 NOTE — Discharge Summary (Signed)
Physician Discharge Summary  Leah Harper:[id number] DOB: 29-Jan-1920 DOA: 07/17/2011  PCP: Leah Fillers., NP, NP Nephrologist: Leah Harper, M.D. Cardiologist: Leah Harper, M.D.  Admit date: 07/17/2011 Discharge date: 07/21/2011  Discharge Diagnoses:  1. UTI 2. Dehydration, resolved 3. Presumed orthostasis 4. Acute renal failure, resolved 5. Chronic kidney disease stage III, stable 6. Hypertension: 7. Severe malnutrition in context of acute illness 8. Dementia 9. Debility  Discharge Condition: Improved  Disposition: Home with home health physical therapy, RN, Child psychotherapist, home health  History of present illness:  78 year old woman seen by her primary care provider the day of admission. She was referred directly for admission for lethargy, poor appetite, weight loss, depression. Several days prior to admission the patient is started on amoxicillin for a urinary tract infection. She did skip a few doses secondary to abdominal upset. She was admitted for treatment of urinary tract infection.  Hospital Course:  Ms. Leah Harper was admitted to the medical floor. She denied fluids for acute renal failure and dehydration. Assessment resolved. She history empirically for urinary tract infection. Culture data confirm Pseudomonas aeruginosa. She will complete ciprofloxacin in the outpatient setting. She had episode of unresponsiveness and hypotension that resolved over the course of several minutes. By history was suggestive of orthostasis. Husband confirms that the patient's blood pressure is quite labile at home. Husband is now on any further investigation including no MRI. No further events. Blood pressure has been stable. By family request case was discussed with the patient's cardiologist. Discontinue Norvasc. Continue metoprolol. Patient evaluated by physical therapy and skilled nursing facility recommended for rehabilitation. Has been declined this. He wishes to try take care  of her at home. He will be sent with an FL-2 in case this does not work out at home and she requires placement. These and other issues as below. 1. Urinary tract infection: Pseudomonas aeruginosa sensitive to ciprofloxacin.  2. Hypotension: Probably related to orthostasis, antihypertensives and dehydration. Remains resolved.  3. Unresponsive episode was probably orthostatic in nature. Husband does not desire any further evaluation including no MRI.  4. Acute renal failure superimposed on chronic kidney disease stage III: Improving/stable. Followup as an outpatient.  5. Dehydration: Appears resolved.  6. Advanced dementia: Stable.  7. Hypertension: Stable. Stop Norvasc given probable intermittent orthostasis. Case discussed with Leah Harper who agrees.  8. Known atrial fibrillation: Stable. Resume beta blocker on discharge.  9. Severe malnutrition in the context of acute illness.  Consultants:  Physical therapy: Skilled nursing facility. Rolling walker with 5 inch wheels.   Nutrition  Procedures:  January 22: Bilateral carotid artery duplex: No evidence of significant internal carotid artery stenosis. Vertebral flow antegrade.   January 22: 2-D echocardiogram: Left ventricle: Mild septal dyssynergy. The cavity size was normal. Wall thickness was increased in a pattern of mild LVH. The estimated ejection fraction was 60%.  Antibiotics:  January 21-21: Rocephin   January 22-23: Vancomycin   January 22-24: Zosyn   January 24: Ciprofloxacin  Discharge Instructions  Discharge Orders    Future Appointments: Provider: Department: Dept Phone: Center:   08/02/2011 9:30 AM Leah Stade, MD [facility] [phone] [facility]     Future Orders Please Complete By Expires   Diet - low sodium heart healthy      Increase activity slowly        Medication List  As of 07/21/2011 10:41 AM   STOP taking these medications         amLODipine 2.5 MG tablet  amoxicillin 250  MG/5ML suspension         TAKE these medications         ALPRAZolam 0.25 MG tablet   Commonly known as: XANAX   Take 0.25 mg by mouth 2 (two) times daily as needed. For anxiety      aspirin 81 MG tablet   Take 81 mg by mouth daily.      ciprofloxacin 500 MG tablet   Commonly known as: CIPRO   Take 1 tablet (500 mg total) by mouth daily.      DSS 100 MG Caps   Take 100 mg by mouth 2 (two) times daily.      ENSURE   Take 237 mLs by mouth 2 (two) times daily between meals.      ezetimibe 10 MG tablet   Commonly known as: ZETIA   Take 1 tablet (10 mg total) by mouth daily.      metoprolol 50 MG tablet   Commonly known as: LOPRESSOR   Take 50 mg by mouth daily.      mirtazapine 7.5 MG tablet   Commonly known as: REMERON   Take 7.5 mg by mouth at bedtime.      polyethylene glycol packet   Commonly known as: MIRALAX / GLYCOLAX   Take 17 g by mouth daily.           Follow-up Information    Follow up with Leah S., NP in 1 week.   Contact information:   [address] [place] [address] [phone]           The results of significant diagnostics from this hospitalization (including imaging, microbiology, ancillary and laboratory) are listed below for reference.    Significant Diagnostic Studies: Ct Head Wo Contrast  07/18/2011  *RADIOLOGY REPORT*  Clinical Data: Syncope.  CT HEAD WITHOUT CONTRAST  Technique:  Contiguous axial images were obtained from the base of the skull through the vertex without contrast.  Comparison: None.  Findings: There is no evidence of intracranial hemorrhage, brain edema or other signs of acute infarction.  There is no evidence of intracranial mass lesion or mass effect.  No abnormal extra-axial fluid collections are identified.  Mild generalized cerebral atrophy noted.  No evidence of hydrocephalus.  No other intracranial abnormality identified.  No skull abnormality seen.  IMPRESSION:  1.  No acute  intracranial abnormality. 2.  Mild cerebral atrophy.  Original Report Authenticated By: Danae Orleans, M.D.   Portable Chest 1 View  07/17/2011  *RADIOLOGY REPORT*  Clinical Data: Malaise.  Lethargy.  Decreased appetite. Dehydration.  PORTABLE CHEST - 1 VIEW  Comparison: 03/11/2011  Findings: Heart size and vascularity are normal and the lungs are clear of infiltrates and effusions.  Chronic interstitial and obstructive changes bilaterally.  No acute osseous abnormality.  IMPRESSION: No acute abnormality.  Chronic interstitial and obstructive lung disease, stable.  Original Report Authenticated By: Gwynn Burly, M.D.    Microbiology: Recent Results (from the past 240 hour(s))  CULTURE, BLOOD (ROUTINE X 2)     Status: Normal (Preliminary result)   Collection Time   07/17/11  7:45 PM      Component Value Range Status Comment   Specimen Description BLOOD RIGHT ARM   Final    Special Requests BOTTLES DRAWN AEROBIC AND ANAEROBIC 5CC   Final    Setup Time [id number]   Final    Culture     Final    Value:  BLOOD CULTURE RECEIVED NO GROWTH TO DATE CULTURE WILL BE HELD FOR 5 DAYS BEFORE ISSUING A FINAL NEGATIVE REPORT   Report Status PENDING   Incomplete   CULTURE, BLOOD (ROUTINE X 2)     Status: Normal (Preliminary result)   Collection Time   07/17/11  7:50 PM      Component Value Range Status Comment   Specimen Description BLOOD RIGHT ARM   Final    Special Requests BOTTLES DRAWN AEROBIC AND ANAEROBIC 2CC   Final    Setup Time [id number]   Final    Culture     Final    Value:        BLOOD CULTURE RECEIVED NO GROWTH TO DATE CULTURE WILL BE HELD FOR 5 DAYS BEFORE ISSUING A FINAL NEGATIVE REPORT   Report Status PENDING   Incomplete   URINE CULTURE     Status: Normal   Collection Time   07/18/11  6:52 AM      Component Value Range Status Comment   Specimen Description URINE, CLEAN CATCH   Final    Special Requests NONE   Final    Setup Time [id number]   Final    Colony Count  15,000 COLONIES/ML   Final    Culture PSEUDOMONAS AERUGINOSA   Final    Report Status 07/20/2011 FINAL   Final    Organism ID, Bacteria PSEUDOMONAS AERUGINOSA   Final      Labs: Basic Metabolic Panel:  Lab 07/20/11 5732 07/20/11 0505 07/19/11 0122 07/18/11 0505 07/17/11 1820 07/17/11 1800  NA -- 136 134* 134* -- 131*  K -- 3.3* 4.2 -- -- --  CL -- 104 103 100 -- 95*  CO2 -- 26 23 25  -- 26  GLUCOSE -- 114* 110* 143* -- 84  BUN -- 16 24* 29* -- 31*  CREATININE 1.72* 1.84* 1.66* 1.60* -- 1.64*  CALCIUM -- 8.7 9.2 9.7 -- 9.7  MG -- -- -- -- 2.0 --  PHOS -- -- -- -- 3.4 --   Liver Function Tests:  Lab 07/17/11 1800  AST 23  ALT 10  ALKPHOS 78  BILITOT 0.7  PROT 7.2  ALBUMIN 3.1*   CBC:  Lab 07/19/11 0507 07/18/11 0505 07/17/11 1820 07/17/11 1800  WBC 6.3 6.6 6.9 6.6  NEUTROABS -- -- -- --  HGB 10.9* 13.1 12.6 12.4  HCT 31.4* 38.8 37.2 36.8  MCV 91.8 91.9 91.6 91.8  PLT 182 220 227 220   Cardiac Enzymes:  Lab 07/19/11 0125 07/18/11 1720 07/18/11 0953  CKTOTAL 41 51 51  CKMB 1.9 2.7 2.8  CKMBINDEX -- -- --  TROPONINI <0.30 <0.30 <0.30   CBG:  Lab 07/17/11 2237  GLUCAP 148*    Time coordinating discharge: 35 minutes.  Signed:  Brendia Sacks, MD  [facility] 07/21/2011, 10:41 AM

## 2011-07-24 ENCOUNTER — Telehealth: Payer: Self-pay | Admitting: *Deleted

## 2011-07-24 LAB — CULTURE, BLOOD (ROUTINE X 2)
Culture  Setup Time: 201301220142
Culture: NO GROWTH

## 2011-07-24 MED ORDER — CIPROFLOXACIN HCL 500 MG PO TABS: 500.0000 mg | ORAL_TABLET | Freq: Every day | ORAL | Status: DC

## 2011-07-24 NOTE — Telephone Encounter (Signed)
Received call from pt's husband, [name] stating pt was told to take CIPRO for UTI upon hospital discharge but was not given an RX. Pt's husband states he has been unable to get in touch with Dr Irene Limbo and wants to know if we will send Rx to pharmacy. Please advise.

## 2011-07-24 NOTE — Telephone Encounter (Signed)
I sent rx for cipro to local pharmacy.  Spoke pt pt's husband- he is aware.  He reports that pt is improving and they will call to arrange a 1 week follow up in our office.

## 2011-07-24 NOTE — Telephone Encounter (Signed)
Cipro rx printed and was called to Tammy at [facility].

## 2011-08-01 ENCOUNTER — Ambulatory Visit (INDEPENDENT_AMBULATORY_CARE_PROVIDER_SITE_OTHER): Payer: Medicare Other | Admitting: Family

## 2011-08-01 ENCOUNTER — Encounter: Payer: Self-pay | Admitting: Family

## 2011-08-01 VITALS — BP 130/70 | HR 66 | Temp 97.5°F | Resp 16 | Wt 92.1 lb

## 2011-08-01 DIAGNOSIS — R634 Abnormal weight loss: Secondary | ICD-10-CM

## 2011-08-01 DIAGNOSIS — N390 Urinary tract infection, site not specified: Secondary | ICD-10-CM

## 2011-08-01 DIAGNOSIS — F0390 Unspecified dementia without behavioral disturbance: Secondary | ICD-10-CM

## 2011-08-01 LAB — POCT URINALYSIS DIPSTICK
Ketones, UA: NEGATIVE
Protein, UA: 30
Spec Grav, UA: 1.02
Urobilinogen, UA: 0.2
pH, UA: 5

## 2011-08-01 NOTE — Assessment & Plan Note (Addendum)
This continues to be a major issue for this patient.  She has refused any treatment for this and the family supports her decision. Husband is thinking about hiring some help in the home as he is starting to become overwhelmed with her care.  He aims to keep her out of a nursing home. I have encouraged the patient to continue the home health PT.

## 2011-08-01 NOTE — Progress Notes (Signed)
Subjective:    Patient ID: Leah Harper, female    DOB: May 04, 1920, 78 y.o.   MRN: [id number]  HPI  Leah Harper is an 78 yr old female who presents today for hospital follow up. Records are reviewed. She was hospitalized for UTI with the following diagnoses:   1. UTI- urine culture grew 15K of cipro sensitive pseudomonas. 2. Dehydration 3. Presumed orthostasis 4. Acute renal failure- Creatinine on admission was 1.84, 1.72 at time of discharge 5. Presumed orthostasis 6. Severe malnutrition in context of acute illness 7. Dementia 8. Debility- referred for home PT at discharge which she is participating in. 9. Chronic kidney disease stage III, stable  Since returning home husband reports that she has been eating better and drinking better.  She is drinking ensure and has gained 1 pound since last visit. Husband feels that she has become increasingly dependent on her. She is often sleepy in the AM, but tells me that it wears off at [facility].  She is taking the remeron pill.  She denies dysuria or frequency of urination.  Review of Systems See HPI  Past Medical History  Diagnosis Date  . Hyperlipidemia   . Hypertension   . AF (atrial fibrillation)   . Hyperthyroidism     remotely in 1941  . Normocytic anemia     2002  . S/P TAH-BSO (total abdominal hysterectomy and bilateral salpingo-oophorectomy)   . Allergy   . Arrhythmia   . Anxiety     History   Social History  . Marital Status: Married    Spouse Name: [name]    Number of Children: 3  . Years of Education: N/A   Occupational History  . retired    Social History Main Topics  . Smoking status: Former Smoker    Quit date: 07/17/1963  . Smokeless tobacco: Never Used  . Alcohol Use: No  . Drug Use: No  . Sexually Active: Not on file   Other Topics Concern  . Not on file   Social History Narrative   RetiredMarried -3 children Tobacco abuse-noAlcohol use-no  Regular exercise-no    Past Surgical History    Procedure Date  . Tonsillectomy   . Total abdominal hysterectomy w/ bilateral salpingoophorectomy 1986  . Bilateral salpingoophorectomy   . Bladder suspension     bladder tack with TAHBSO    Family History  Problem Relation Age of Onset  . Heart disease Father   . Heart disease Brother     heart attack  . Heart disease Brother     No Known Allergies  Current Outpatient Prescriptions on File Prior to Visit  Medication Sig Dispense Refill  . ALPRAZolam (XANAX) 0.25 MG tablet Take 0.25 mg by mouth 2 (two) times daily as needed. For anxiety      . aspirin 81 MG tablet Take 81 mg by mouth daily.        Marland Kitchen ENSURE (ENSURE) Take 237 mLs by mouth 2 (two) times daily between meals.        Marland Kitchen ezetimibe (ZETIA) 10 MG tablet Take 1 tablet (10 mg total) by mouth daily.  90 tablet  1  . metoprolol (LOPRESSOR) 50 MG tablet Take 50 mg by mouth daily.       . mirtazapine (REMERON) 7.5 MG tablet Take 7.5 mg by mouth at bedtime.          BP 130/70  Pulse 66  Temp(Src) 97.5 F (36.4 C) (Oral)  Resp 16  Wt 92 lb 1.3  oz (41.767 kg)  SpO2 99%       Objective:   Physical Exam  Constitutional:       Frail elderly white female, NAD  Cardiovascular: Normal rate and regular rhythm.   No murmur heard. Pulmonary/Chest: Effort normal and breath sounds normal. No respiratory distress. She has no wheezes. She has no rales. She exhibits no tenderness.  Abdominal: Soft. Bowel sounds are normal.       Firm mobile mass in subcutaneous tissue right lower abdomen at former lovenox injection site- non-tender.    Psychiatric: Her speech is normal. Her mood appears not anxious. Cognition and memory are not impaired. She does not exhibit a depressed mood.          Assessment & Plan:

## 2011-08-01 NOTE — Patient Instructions (Signed)
Please complete our lab work prior to leaving today. Follow up in 2 months, sooner if problems or concern.

## 2011-08-01 NOTE — Assessment & Plan Note (Signed)
She completed cipro.  Will reculture urine today as dip is still suggestive of UTI. She is asymptomatic.

## 2011-08-01 NOTE — Assessment & Plan Note (Signed)
She will continue remeron and ensure.  Husband will monitor her weight and let me know if she has any further weight loss.

## 2011-08-01 NOTE — Assessment & Plan Note (Signed)
Obtain follow up kidney function today to ensure stability since leaving hospital.

## 2011-08-02 ENCOUNTER — Ambulatory Visit (INDEPENDENT_AMBULATORY_CARE_PROVIDER_SITE_OTHER): Payer: Medicare Other | Admitting: Cardiovascular Disease

## 2011-08-02 ENCOUNTER — Telehealth: Payer: Self-pay | Admitting: Cardiovascular Disease

## 2011-08-02 ENCOUNTER — Telehealth: Payer: Self-pay | Admitting: Family

## 2011-08-02 DIAGNOSIS — I10 Essential (primary) hypertension: Secondary | ICD-10-CM

## 2011-08-02 DIAGNOSIS — Z9189 Other specified personal risk factors, not elsewhere classified: Secondary | ICD-10-CM

## 2011-08-02 LAB — URINE CULTURE: Organism ID, Bacteria: NO GROWTH

## 2011-08-02 MED ORDER — METOPROLOL TARTRATE 25 MG PO TABS: 25.0000 mg | ORAL_TABLET | Freq: Every day | ORAL | Status: DC

## 2011-08-02 NOTE — Telephone Encounter (Signed)
Notified pt's husband. He wanted to let you know that pt saw her cardiologist this morning and she was cleared by him.

## 2011-08-02 NOTE — Telephone Encounter (Signed)
New Msg: pharmacy calling regarding pt metoprolol. Pharmacy wanting to clarify if RX is for succinate or tartrate. Please return call to discuss further.

## 2011-08-02 NOTE — Assessment & Plan Note (Signed)
Stable with no angina and good activity level.  Continue medical Rx  

## 2011-08-02 NOTE — Telephone Encounter (Signed)
Please call Mr. Schuhmacher and notify him that her urine culture is negative.

## 2011-08-02 NOTE — Patient Instructions (Signed)
Your physician recommends that you schedule a follow-up appointment with Dr. Eden Emms as needed.  Decrease Metoprolol to 25mg  once daily.

## 2011-08-02 NOTE — Assessment & Plan Note (Signed)
Cholesterol is at goal.  Continue current dose of statin and diet Rx.  No myalgias or side effects.  F/U  LFT's in 6 months. Lab Results  Component Value Date   LDLCALC 103* 03/15/2011             

## 2011-08-02 NOTE — Assessment & Plan Note (Signed)
Running low in hospital  Meds cut back  Decrease lopresser to 25 mg

## 2011-08-02 NOTE — Progress Notes (Signed)
Leah Harper is seen today for F/U of elevated lipids and HTN. Her depression seems improved. She needs to F/U with Dr Marveen Reeks for this and weight loss. She has been more compliant with her Cozaar I. She has not had any SSCP, palpitations, PND, orthopnea or edema. She has occasoinal dizzyness. She indicates she would like her daughter [name] to have access to her medical information and for [place] to call her with results of tests and such.  Recent hospitalization for UTI and postural hypotension.  Will cut beta blocker back and have her see Macario Carls.  No need for cardiac F/U  Postural symptoms resolved  ROS: Denies fever, malais, weight loss, blurry vision, decreased visual acuity, cough, sputum, SOB, hemoptysis, pleuritic pain, palpitaitons, heartburn, abdominal pain, melena, lower extremity edema, claudication, or rash.  All other systems reviewed and negative  General:  BP standing 120/70 mmHg Affect appropriate Frail elderly female HEENT: normal Neck supple with no adenopathy JVP normal no bruits no thyromegaly Lungs clear with no wheezing and good diaphragmatic motion Heart:  S1/S2 no murmur, no rub, gallop or click PMI normal Abdomen: benighn, BS positve, no tenderness, no AAA no bruit.  No HSM or HJR Distal pulses intact with no bruits No edema Neuro non-focal Skin warm and dry No muscular weakness   Current Outpatient Prescriptions  Medication Sig Dispense Refill  . ALPRAZolam (XANAX) 0.25 MG tablet Take 0.25 mg by mouth 2 (two) times daily as needed. For anxiety      . aspirin 81 MG tablet Take 81 mg by mouth daily.        Tery Sanfilippo Sodium (DSS) 100 MG CAPS Take 100 mg by mouth 2 (two) times daily.      Marland Kitchen ENSURE (ENSURE) Take 237 mLs by mouth 2 (two) times daily between meals.        Marland Kitchen ezetimibe (ZETIA) 10 MG tablet Take 1 tablet (10 mg total) by mouth daily.  90 tablet  1  . metoprolol (LOPRESSOR) 50 MG tablet Take 50 mg by mouth daily.       . mirtazapine (REMERON) 7.5  MG tablet Take 7.5 mg by mouth at bedtime.          Allergies  Review of patient's allergies indicates no known allergies.  Electrocardiogram:  Assessment and Plan

## 2011-08-29 ENCOUNTER — Encounter: Payer: Self-pay | Admitting: Family

## 2011-08-29 ENCOUNTER — Ambulatory Visit (INDEPENDENT_AMBULATORY_CARE_PROVIDER_SITE_OTHER): Payer: Medicare Other | Admitting: Family

## 2011-08-29 VITALS — BP 136/60 | HR 53 | Temp 97.5°F | Resp 14 | Wt 96.0 lb

## 2011-08-29 DIAGNOSIS — N390 Urinary tract infection, site not specified: Secondary | ICD-10-CM

## 2011-08-29 LAB — POCT URINALYSIS DIPSTICK
Bilirubin, UA: NEGATIVE
Glucose, UA: NEGATIVE
Ketones, UA: NEGATIVE
Spec Grav, UA: <=1.005
pH, UA: 6

## 2011-08-29 MED ORDER — CIPROFLOXACIN HCL 250 MG PO TABS: 250.0000 mg | ORAL_TABLET | Freq: Two times a day (BID) | ORAL | Status: DC

## 2011-08-29 NOTE — Progress Notes (Signed)
Subjective:    Patient ID: Leah Harper, female    DOB: [birth year], 78 y.o.   MRN: [id number]  HPI  Ms.  Leah Harper is an 78 yr old female with complaint of dysuria.  Symptoms started yesterday.  She denies fever, low back pain or hematuria.  Dysuria is better today than it was yesterday.    She has been doing physical therapy.  Husband thinks this has helped her a lot.  He reports that she is eating well.  The pt has gained 4 pounds since her last visit.     Review of Systems See HPI  Past Medical History  Diagnosis Date  . Hyperlipidemia   . Hypertension   . AF (atrial fibrillation)   . Hyperthyroidism     remotely in 1941  . Normocytic anemia     2002  . S/P TAH-BSO (total abdominal hysterectomy and bilateral salpingo-oophorectomy)   . Allergy   . Arrhythmia   . Anxiety     History   Social History  . Marital Status: Married    Spouse Name: Leah Harper    Number of Children: 3  . Years of Education: N/A   Occupational History  . retired    Social History Main Topics  . Smoking status: Former Smoker    Quit date: 07/17/1963  . Smokeless tobacco: Never Used  . Alcohol Use: No  . Drug Use: No  . Sexually Active: Not on file   Other Topics Concern  . Not on file   Social History Narrative   RetiredMarried -3 children Tobacco abuse-noAlcohol use-no  Regular exercise-no    Past Surgical History  Procedure Date  . Tonsillectomy   . Total abdominal hysterectomy w/ bilateral salpingoophorectomy 1986  . Bilateral salpingoophorectomy   . Bladder suspension     bladder tack with TAHBSO    Family History  Problem Relation Age of Onset  . Heart disease Father   . Heart disease Brother     heart attack  . Heart disease Brother     No Known Allergies  Current Outpatient Prescriptions on File Prior to Visit  Medication Sig Dispense Refill  . ALPRAZolam (XANAX) 0.25 MG tablet Take 0.25 mg by mouth 2 (two) times daily as needed. For anxiety      . aspirin 81  MG tablet Take 81 mg by mouth daily.        Tery Sanfilippo Sodium (DSS) 100 MG CAPS Take 100 mg by mouth 2 (two) times daily.      Marland Kitchen ENSURE (ENSURE) Take 237 mLs by mouth 2 (two) times daily between meals.        Marland Kitchen ezetimibe (ZETIA) 10 MG tablet Take 1 tablet (10 mg total) by mouth daily.  90 tablet  1  . metoprolol (LOPRESSOR) 25 MG tablet Take 1 tablet (25 mg total) by mouth daily.  30 tablet  11  . mirtazapine (REMERON) 7.5 MG tablet Take 7.5 mg by mouth at bedtime.          BP 136/60  Pulse 53  Temp(Src) 97.5 F (36.4 C) (Oral)  Resp 14  Wt 96 lb 0.6 oz (43.563 kg)  SpO2 94%       Objective:   Physical Exam  Constitutional: She appears well-developed and well-nourished. No distress.  Cardiovascular: Normal rate and regular rhythm.   No murmur heard. Pulmonary/Chest: Effort normal and breath sounds normal. No respiratory distress. She has no wheezes. She has no rales. She exhibits no tenderness.  Abdominal:  Soft. Bowel sounds are normal. She exhibits no distension.  Genitourinary:       Neg CVAT  Musculoskeletal: She exhibits no edema.  Psychiatric: She has a normal mood and affect. Her behavior is normal. Judgment and thought content normal.          Assessment & Plan:

## 2011-08-29 NOTE — Patient Instructions (Signed)
Start cipro. Call if symptoms worsen, if weakness, fever >100,  or if no improvement in next 2-3 days.

## 2011-08-29 NOTE — Assessment & Plan Note (Signed)
UA is suggestive of UTI.  Will start empiric cipro and send urine for culture.

## 2011-08-31 LAB — URINE CULTURE: Colony Count: >=100000

## 2011-09-01 ENCOUNTER — Telehealth: Payer: Self-pay | Admitting: Family

## 2011-09-01 MED ORDER — CIPROFLOXACIN HCL 250 MG PO TABS: 250.0000 mg | ORAL_TABLET | Freq: Two times a day (BID) | ORAL | Status: AC

## 2011-09-01 NOTE — Telephone Encounter (Signed)
Notified pt's husband and he states he will pick rx up this afternoon.

## 2011-09-01 NOTE — Telephone Encounter (Signed)
Pls call husband and let him know that her urine did grow bacteria and it is sensitive to cipro.  I would like for her to complete 5 days total of cipro and have sent some additional tablets to her pharmacy.

## 2011-10-03 ENCOUNTER — Ambulatory Visit: Payer: Medicare Other | Admitting: Family

## 2011-12-05 ENCOUNTER — Other Ambulatory Visit: Payer: Self-pay | Admitting: *Deleted

## 2011-12-05 MED ORDER — EZETIMIBE 10 MG PO TABS: 10.0000 mg | ORAL_TABLET | Freq: Every day | ORAL | Status: DC

## 2011-12-05 NOTE — Telephone Encounter (Signed)
Received call from pt's husband requesting refill on Zetia. Advised him that refill will be sent to pharmacy but pt is due for a follow up in the office. He states that they currently have a very sick brother and he will have to call us back in a few days re: appt.

## 2011-12-13 ENCOUNTER — Telehealth: Payer: Self-pay | Admitting: *Deleted

## 2011-12-13 NOTE — Telephone Encounter (Signed)
Spoke with husband.  Verified that pt has attempted statins in the past and that she was unable to tolerate.  Form completed.

## 2011-12-13 NOTE — Telephone Encounter (Signed)
Form faxed to [employer] at [phone]. Awaiting approval/denial.

## 2011-12-13 NOTE — Telephone Encounter (Signed)
Received fax from [facility] [employer] requesting prior authorization for zetia. Forms initiated and forwarded to Provider for completion and signature.

## 2011-12-19 NOTE — Telephone Encounter (Signed)
Spoke to Lowell at [employer] and verified that authorization has been approved from 12/14/11 through 12/13/12. Notified Lisa at [facility] and she states pt had already picked up Rx. She reports that pt picked up a 90 day supply for $100 as medication is on pt's tier 2 list.

## 2012-01-30 ENCOUNTER — Encounter: Payer: Self-pay | Admitting: Family

## 2012-01-30 ENCOUNTER — Ambulatory Visit (INDEPENDENT_AMBULATORY_CARE_PROVIDER_SITE_OTHER): Payer: Medicare Other | Admitting: Family

## 2012-01-30 VITALS — BP 130/70 | HR 72 | Temp 97.6°F | Resp 16 | Wt 89.1 lb

## 2012-01-30 DIAGNOSIS — Z23 Encounter for immunization: Secondary | ICD-10-CM

## 2012-01-30 DIAGNOSIS — R634 Abnormal weight loss: Secondary | ICD-10-CM

## 2012-01-30 DIAGNOSIS — I10 Essential (primary) hypertension: Secondary | ICD-10-CM

## 2012-01-30 DIAGNOSIS — IMO0002 Reserved for concepts with insufficient information to code with codable children: Secondary | ICD-10-CM

## 2012-01-30 MED ORDER — METOPROLOL TARTRATE 25 MG PO TABS: 25.0000 mg | ORAL_TABLET | Freq: Every day | ORAL | Status: DC

## 2012-01-30 NOTE — Assessment & Plan Note (Signed)
BP stable.  Continue lopressor.

## 2012-01-30 NOTE — Progress Notes (Signed)
Subjective:    Patient ID: Leah Harper, female    DOB: 02/05/1920, 78 y.o.   MRN: [id number]  HPI  Leah Harper is an 78 yr old female who presents today with her husband due to right leg wound.  Pt reports that she scraped her leg while at the beach 3 weeks ago.  She is not sure what she scraped it on.  Initially, wound was "very angry looking" per husband, but is now improving.  Husband has been cleaning the wound daily with diluted peroxide and then vaseline.  Weight loss- pt is not taking remeron.  Husband is concerned about her poor appetite.  She drinks ensure sometimes, but husband reports that she drinks ensure in place of meals.  She has lost 7 pounds since her last visit.   Review of Systems    see HPI  Past Medical History  Diagnosis Date  . Hyperlipidemia   . Hypertension   . AF (atrial fibrillation)   . Hyperthyroidism     remotely in 1941  . Normocytic anemia     2002  . S/P TAH-BSO (total abdominal hysterectomy and bilateral salpingo-oophorectomy)   . Allergy   . Arrhythmia   . Anxiety     History   Social History  . Marital Status: Married    Spouse Name: [name]    Number of Children: 3  . Years of Education: N/A   Occupational History  . retired    Social History Main Topics  . Smoking status: Former Smoker    Quit date: 07/17/1963  . Smokeless tobacco: Never Used  . Alcohol Use: No  . Drug Use: No  . Sexually Active: Not on file   Other Topics Concern  . Not on file   Social History Narrative   RetiredMarried -3 children Tobacco abuse-noAlcohol use-no  Regular exercise-no    Past Surgical History  Procedure Date  . Tonsillectomy   . Total abdominal hysterectomy w/ bilateral salpingoophorectomy 1986  . Bilateral salpingoophorectomy   . Bladder suspension     bladder tack with TAHBSO    Family History  Problem Relation Age of Onset  . Heart disease Father   . Heart disease Brother     heart attack  . Heart disease Brother      Allergies  Allergen Reactions  . Statins     Myalgia     Current Outpatient Prescriptions on File Prior to Visit  Medication Sig Dispense Refill  . ALPRAZolam (XANAX) 0.25 MG tablet Take 0.25 mg by mouth 2 (two) times daily as needed. For anxiety      . aspirin 81 MG tablet Take 81 mg by mouth daily.        Tery Sanfilippo Sodium (DSS) 100 MG CAPS Take 100 mg by mouth 2 (two) times daily.      Marland Kitchen ENSURE (ENSURE) Take 237 mLs by mouth 2 (two) times daily between meals.        Marland Kitchen ezetimibe (ZETIA) 10 MG tablet Take 1 tablet (10 mg total) by mouth daily.  90 tablet  0  . mirtazapine (REMERON) 7.5 MG tablet Take 7.5 mg by mouth at bedtime.        Marland Kitchen DISCONTD: metoprolol (LOPRESSOR) 25 MG tablet Take 1 tablet (25 mg total) by mouth daily.  30 tablet  11    BP 130/70  Pulse 72  Temp 97.6 F (36.4 C) (Oral)  Resp 16  Wt 89 lb 1.3 oz (40.406 kg)  Objective:   Physical Exam  Constitutional: She appears cachectic. No distress.  Cardiovascular: Normal rate and regular rhythm.   No murmur heard. Pulmonary/Chest: Effort normal and breath sounds normal. No respiratory distress. She has no wheezes. She has no rales. She exhibits no tenderness.  Skin:       Superficial abrasion right inner shin.  No surrounding erythema, no drainage, no odor.          Assessment & Plan:

## 2012-01-30 NOTE — Addendum Note (Signed)
Addended by: Mervin Kung A on: 01/30/2012 10:53 AM   Modules accepted: Orders

## 2012-01-30 NOTE — Patient Instructions (Addendum)
Apply neosporin ointment to right leg wound daily.   Call if increased redness, swelling,  Oozing.   Drink 2 cans of ensure after meals every day. Weigh once a week and contact us with your weights for the next 3 weeks.

## 2012-01-30 NOTE — Assessment & Plan Note (Signed)
Recommended that she resume remeron QHS.  Pt to drink 2 cans ensure a day in addition to regular meals. Recommended healthy high calorie snacks such as nuts/dried fruit.  Weigh weekly, call us with weekly weight for next 3 weeks.

## 2012-01-30 NOTE — Assessment & Plan Note (Addendum)
Does not appear infected.  Recommended daily wound care with neosporin. Call if increased redness, pain, swelling, drainage, odor, or if wound does not continue to improve.  Pt and husband verbalize understanding. Overdue for tetanus- Tdap given today.

## 2012-02-03 IMAGING — CT CT ABD-PELV W/O CM
2 of 4 series · 16 of 46 positions shown, 18 images · non-contrast
Comparison: None.

CLINICAL DATA: Anorexia.  Weight loss.

CT ABDOMEN AND PELVIS WITHOUT CONTRAST
TECHNIQUE: Multidetector CT imaging of the abdomen and pelvis was
performed following the standard protocol without intravenous
contrast.

[Series 2: abd/pelvis 5.0 b31f · axial · 0.71mm/px · z∈[-391,-56]mm · 13 of 76 slices shown, 15 images]
[im 6/76  soft-tissue]
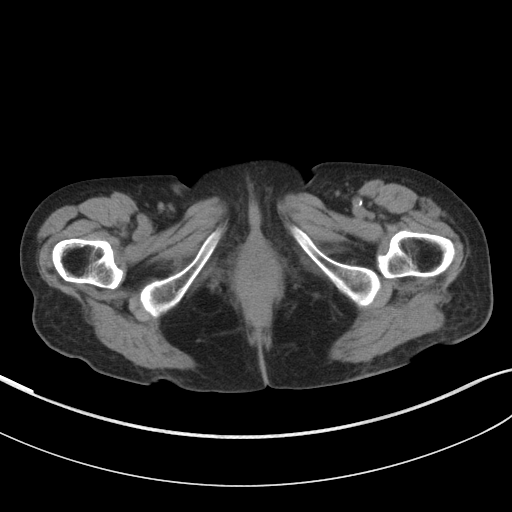
[im 6/76  bone]
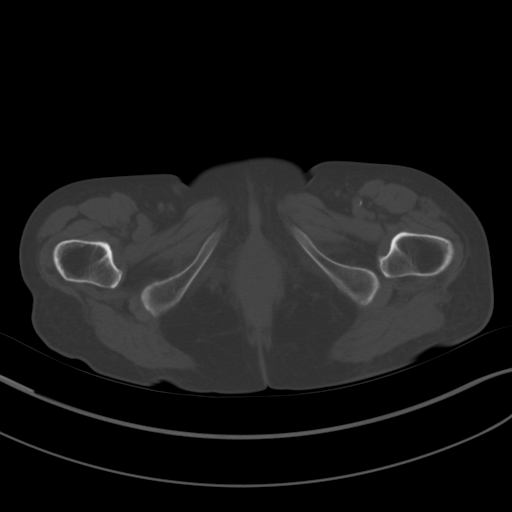
[im 12/76  soft-tissue]
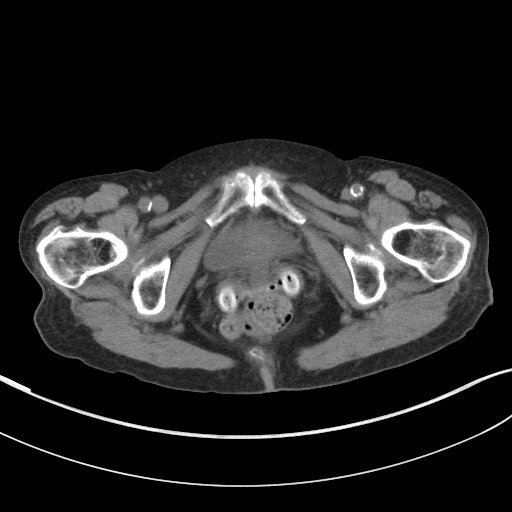
[im 17/76  soft-tissue]
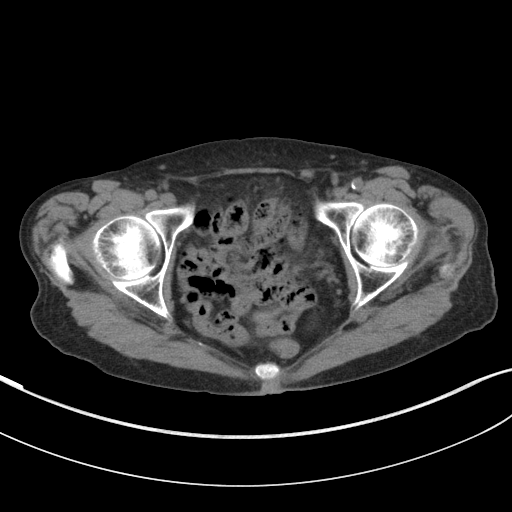
[im 23/76  soft-tissue]
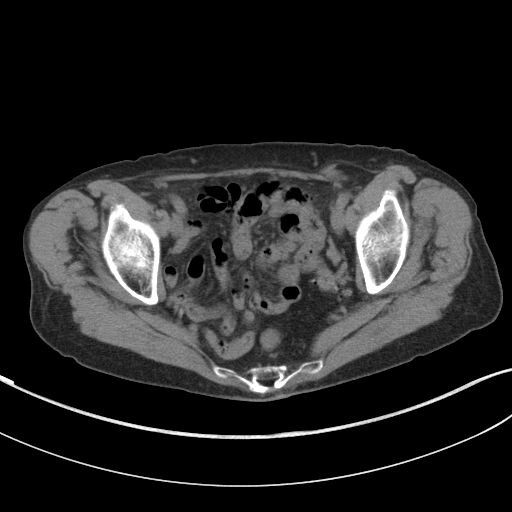
[im 28/76  soft-tissue]
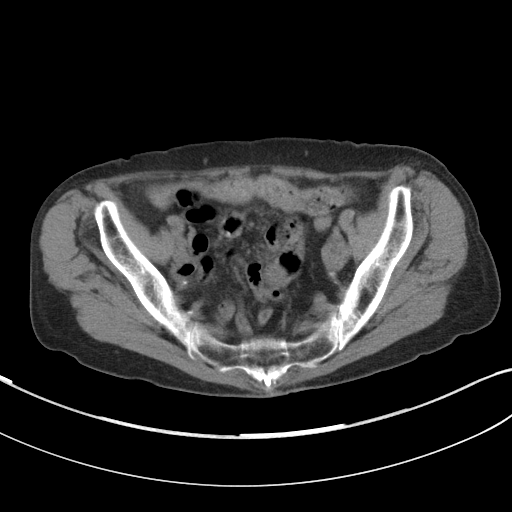
[im 34/76  soft-tissue]
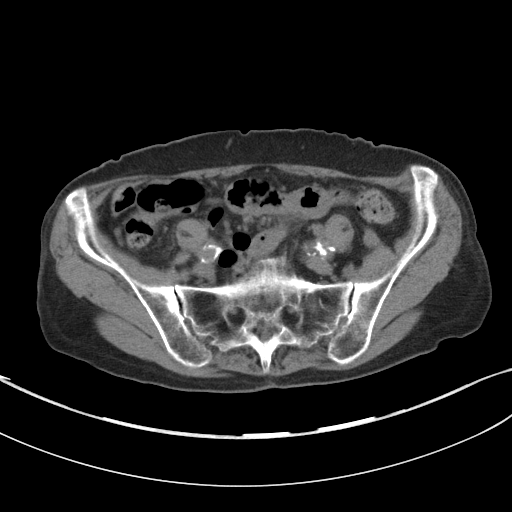
[im 39/76  soft-tissue]
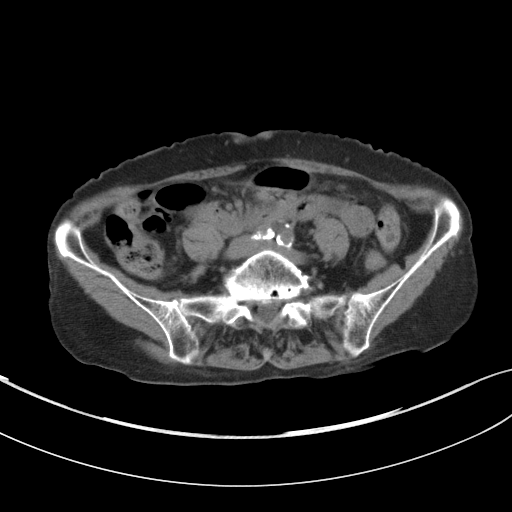
[im 45/76  soft-tissue]
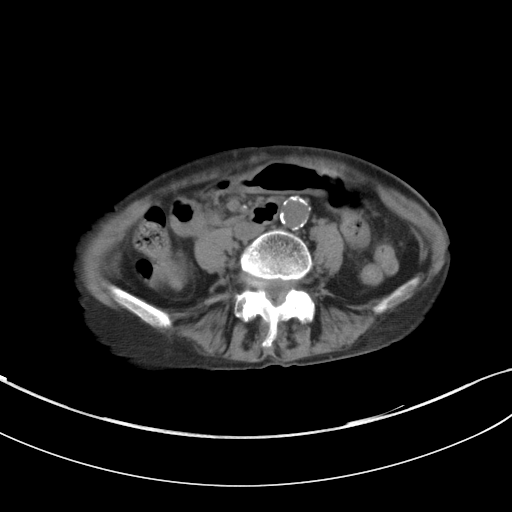
[im 51/76  soft-tissue]
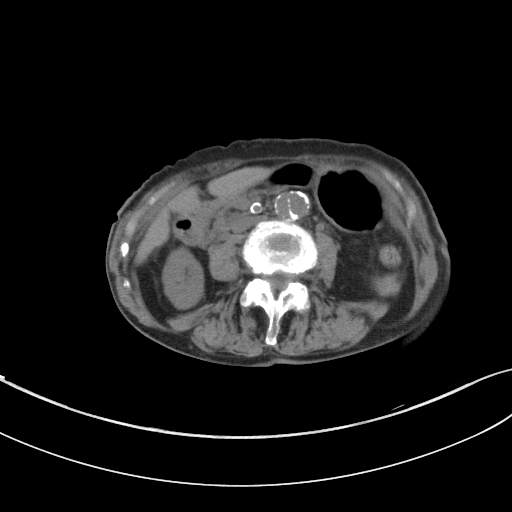
[im 51/76  bone]
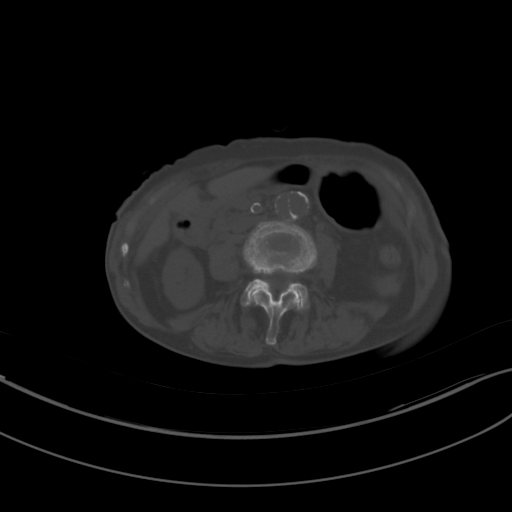
[im 56/76  soft-tissue]
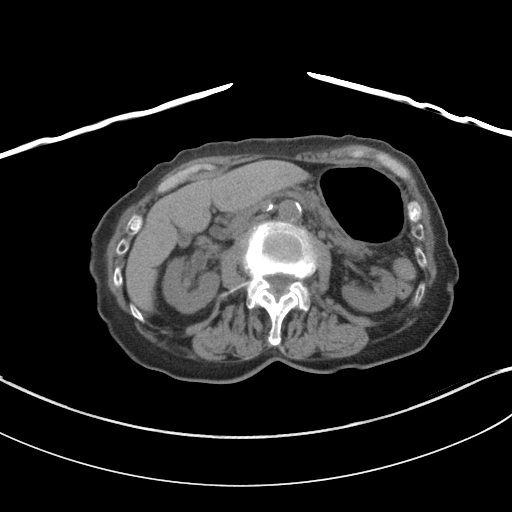
[im 62/76  soft-tissue]
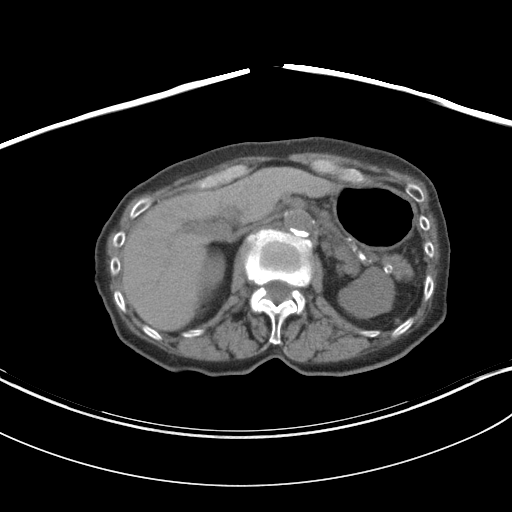
[im 67/76  soft-tissue]
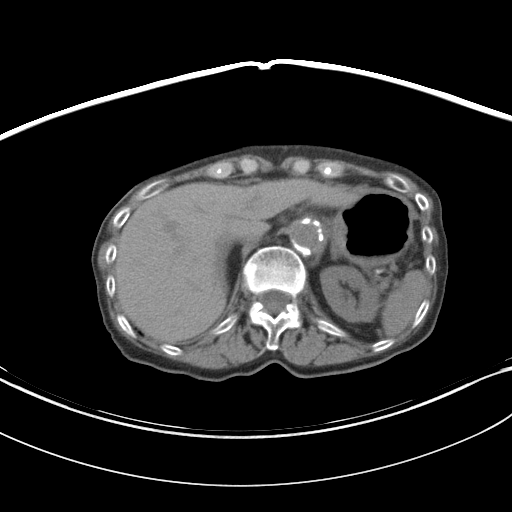
[im 73/76  soft-tissue]
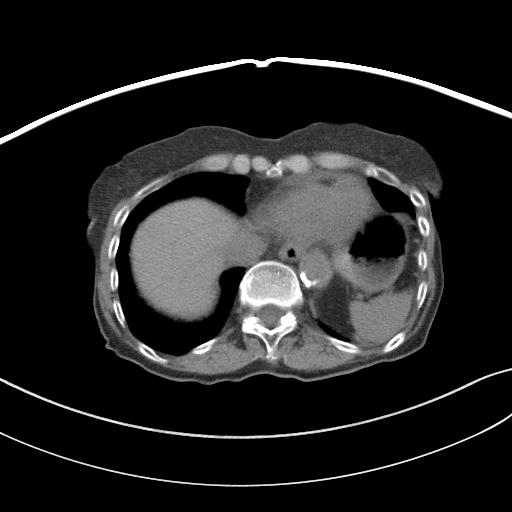

[Series 5: abd/pelvis 3.0 coronal · coronal · 0.70mm/px · 3 of 64 slices shown]
[im 22/64  soft-tissue]
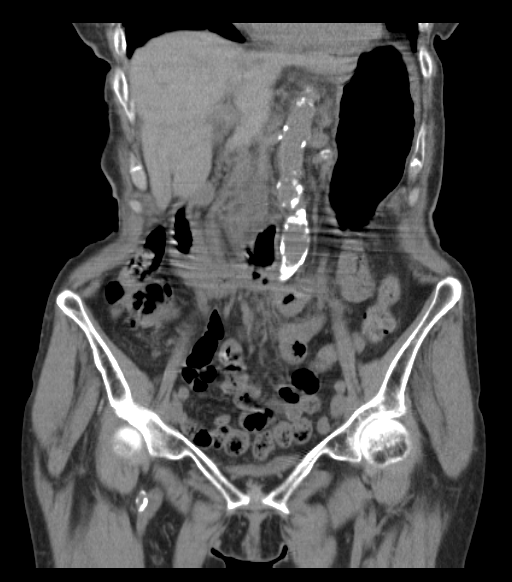
[im 29/64  soft-tissue]
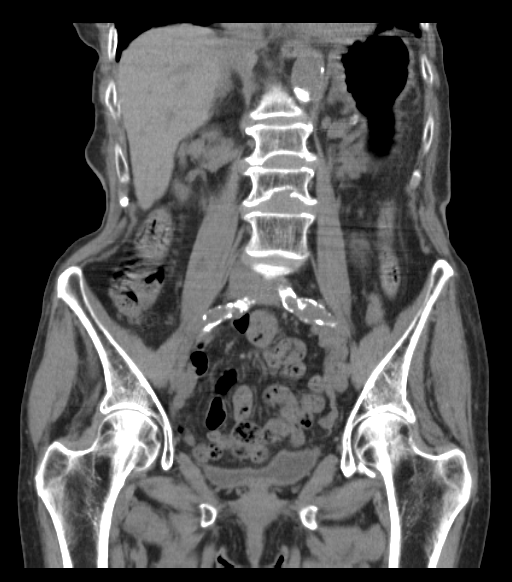
[im 36/64  soft-tissue]
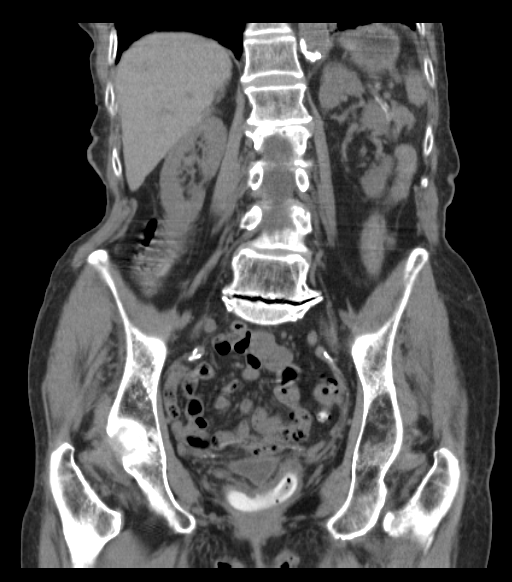

[16 of 46 positions shown; findings below may reference images not displayed]

FINDINGS: There is a suggestion of a 7 mm noncalcified plaque along
the right hemidiaphragm on image 6 of series 2.

The visualized portion of the liver, spleen, pancreas, and adrenal
glands appear unremarkable in noncontrast CT appearance.

Atherosclerosis of the abdominal aorta noted with a fusiform
infrarenal abdominal aortic aneurysm with Modic mural thrombus, but
with maximum diameter of only 2.6 cm (aorta measures 1.7 cm above
this focal dilatation).

Orally administered contrast extends through to the colon, and no
dilated bowel is evident.  The terminal ileum appears unremarkable.

There is sigmoid diverticulosis without compelling findings of
active diverticulitis.  A vaginal pessary is present.

Urinary bladder appears unremarkable.  Uterus is absent.

The kidneys appear unremarkable, as do the proximal ureters.

Lower lumbar spondylosis and degenerative disc disease noted, with
inferior endplate can cavity at the L2 and L3 possibly from broad
Schmorl's nodes or prior inferior endplate compressions.
IMPRESSION: 1.  Small infrarenal abdominal aortic aneurysm with chronic mural
thrombus.
2.  Sigmoid diverticulosis but without active diverticulitis
identified.
3.  Lower lumbar spondylosis and degenerative disc disease.
4.  A specific cause for the patient's anorexia and weight loss is
not identified.

## 2012-03-22 ENCOUNTER — Telehealth: Payer: Self-pay | Admitting: Family

## 2012-03-22 ENCOUNTER — Ambulatory Visit (HOSPITAL_BASED_OUTPATIENT_CLINIC_OR_DEPARTMENT_OTHER)
Admission: RE | Admit: 2012-03-22 | Discharge: 2012-03-22 | Disposition: A | Discharge status: Home / Self Care | Payer: Medicare Other | Source: Ambulatory Visit | Attending: Family | Admitting: Family

## 2012-03-22 ENCOUNTER — Encounter: Payer: Self-pay | Admitting: Family

## 2012-03-22 ENCOUNTER — Ambulatory Visit (INDEPENDENT_AMBULATORY_CARE_PROVIDER_SITE_OTHER): Payer: Medicare Other | Admitting: Family

## 2012-03-22 ENCOUNTER — Other Ambulatory Visit: Payer: Self-pay | Admitting: Family

## 2012-03-22 VITALS — BP 122/60 | HR 81 | Temp 98.1°F | Resp 14 | Wt 89.0 lb

## 2012-03-22 DIAGNOSIS — J4489 Other specified chronic obstructive pulmonary disease: Secondary | ICD-10-CM | POA: Insufficient documentation

## 2012-03-22 DIAGNOSIS — F0390 Unspecified dementia without behavioral disturbance: Secondary | ICD-10-CM

## 2012-03-22 DIAGNOSIS — J40 Bronchitis, not specified as acute or chronic: Secondary | ICD-10-CM

## 2012-03-22 DIAGNOSIS — R05 Cough: Secondary | ICD-10-CM

## 2012-03-22 DIAGNOSIS — R634 Abnormal weight loss: Secondary | ICD-10-CM | POA: Insufficient documentation

## 2012-03-22 DIAGNOSIS — J449 Chronic obstructive pulmonary disease, unspecified: Secondary | ICD-10-CM | POA: Insufficient documentation

## 2012-03-22 DIAGNOSIS — N390 Urinary tract infection, site not specified: Secondary | ICD-10-CM

## 2012-03-22 LAB — POCT URINALYSIS DIPSTICK
Glucose, UA: NEGATIVE
Ketones, UA: NEGATIVE
Spec Grav, UA: 1.025
Urobilinogen, UA: 0.2

## 2012-03-22 MED ORDER — CIPROFLOXACIN HCL 250 MG PO TABS: 250.0000 mg | ORAL_TABLET | Freq: Two times a day (BID) | ORAL | Status: DC

## 2012-03-22 MED ORDER — BENZONATATE 100 MG PO CAPS: 100.0000 mg | ORAL_CAPSULE | Freq: Three times a day (TID) | ORAL | Status: AC | PRN

## 2012-03-22 MED ORDER — MIRTAZAPINE 7.5 MG PO TABS: 7.5000 mg | ORAL_TABLET | Freq: Every day | ORAL | Status: DC

## 2012-03-22 MED ORDER — EZETIMIBE 10 MG PO TABS: 10.0000 mg | ORAL_TABLET | Freq: Every day | ORAL | Status: DC

## 2012-03-22 NOTE — Assessment & Plan Note (Addendum)
CXR is performed today and is neg for pneumonia. Rx tessalon perles.  Plan rx with cipro for bronchitis and uti.

## 2012-03-22 NOTE — Addendum Note (Signed)
Addended by: Elnora Morrison on: 03/22/2012 05:35 PM   Modules accepted: Orders

## 2012-03-22 NOTE — Patient Instructions (Signed)
Please follow up in 1 week. Restart remeron. Call if fever, increased weakness, if not eating drinking.

## 2012-03-22 NOTE — Progress Notes (Signed)
Subjective:    Patient ID: Leah Harper, female    DOB: 1920/02/11, 78 y.o.   MRN: [id number]  HPI  [name] is an 78 yr old female who presents today with chief complaint of cough.  She reports that she is also experiencing fatigue.  Husband is concerned that she is not eating.  +weakness.  These symptoms started  1 week ago.  Per husband, remeron was helping her but she stopped taking and has refused to take it.  Review of Systems See HPI  Past Medical History  Diagnosis Date  . Hyperlipidemia   . Hypertension   . AF (atrial fibrillation)   . Hyperthyroidism     remotely in 1941  . Normocytic anemia     2002  . S/P TAH-BSO (total abdominal hysterectomy and bilateral salpingo-oophorectomy)   . Allergy   . Arrhythmia   . Anxiety     History   Social History  . Marital Status: Married    Spouse Name: [name]    Number of Children: 3  . Years of Education: N/A   Occupational History  . retired    Social History Main Topics  . Smoking status: Former Smoker    Quit date: 07/17/1963  . Smokeless tobacco: Never Used  . Alcohol Use: No  . Drug Use: No  . Sexually Active: Not on file   Other Topics Concern  . Not on file   Social History Narrative   RetiredMarried -3 children Tobacco abuse-noAlcohol use-no  Regular exercise-no    Past Surgical History  Procedure Date  . Tonsillectomy   . Total abdominal hysterectomy w/ bilateral salpingoophorectomy 1986  . Bilateral salpingoophorectomy   . Bladder suspension     bladder tack with TAHBSO    Family History  Problem Relation Age of Onset  . Heart disease Father   . Heart disease Brother     heart attack  . Heart disease Brother     Allergies  Allergen Reactions  . Statins     Myalgia     Current Outpatient Prescriptions on File Prior to Visit  Medication Sig Dispense Refill  . ALPRAZolam (XANAX) 0.25 MG tablet Take 0.25 mg by mouth 2 (two) times daily as needed. For anxiety      . aspirin 81  MG tablet Take 81 mg by mouth daily.        Tery Sanfilippo Sodium (DSS) 100 MG CAPS Take 100 mg by mouth 2 (two) times daily.      Marland Kitchen ENSURE (ENSURE) Take 237 mLs by mouth 2 (two) times daily between meals.        Marland Kitchen ezetimibe (ZETIA) 10 MG tablet Take 1 tablet (10 mg total) by mouth daily.  90 tablet  0  . metoprolol tartrate (LOPRESSOR) 25 MG tablet Take 1 tablet (25 mg total) by mouth daily.  90 tablet  1  . mirtazapine (REMERON) 7.5 MG tablet Take 7.5 mg by mouth at bedtime.          BP 122/60  Pulse 81  Temp 98.1 F (36.7 C) (Oral)  Resp 14  Wt 89 lb (40.37 kg)  SpO2 98%       Objective:   Physical Exam  Constitutional:       Thin elderly white female.  Flat affect.  Very hard of hearing.  HENT:  Right Ear: Tympanic membrane and ear canal normal.  Left Ear: Tympanic membrane and ear canal normal.  Mouth/Throat: No posterior oropharyngeal edema or  posterior oropharyngeal erythema.  Cardiovascular: Normal rate and regular rhythm.   No murmur heard. Pulmonary/Chest: Effort normal and breath sounds normal. No respiratory distress. She has no wheezes. She has no rales. She exhibits no tenderness.  Musculoskeletal: She exhibits no edema.  Psychiatric:       Flat affect.          Assessment & Plan:

## 2012-03-22 NOTE — Assessment & Plan Note (Signed)
Rx with cipro. Send urine for culture.

## 2012-03-22 NOTE — Assessment & Plan Note (Signed)
I think this is primary cause for her overall failure to thrive and anorexia.  Suspect some superimposed depression.  Unfortunately, pt has been adamant about refusing to take medication for depression or dementia.  Briefly, she was agreeable to take Remeron which helped her mood and her appetite.  I have encouraged her to resume Remeron, I have also asked the husband to keep a close eye on her oral intake and to bring her to the ED if she develops increased weakness, or stops eating/drinking. He verbalizes understanding.

## 2012-03-22 NOTE — Telephone Encounter (Signed)
CXR reviewed, negative for pneumonia.  Rx sent for cipro to pharmacy.  Spoke with husband.  He is aware.

## 2012-03-26 LAB — URINE CULTURE: Colony Count: >=100000

## 2012-03-29 ENCOUNTER — Ambulatory Visit (INDEPENDENT_AMBULATORY_CARE_PROVIDER_SITE_OTHER): Payer: Medicare Other | Admitting: Family

## 2012-03-29 VITALS — BP 142/72 | HR 89 | Temp 97.8°F | Wt 89.1 lb

## 2012-03-29 DIAGNOSIS — J40 Bronchitis, not specified as acute or chronic: Secondary | ICD-10-CM

## 2012-03-29 DIAGNOSIS — N390 Urinary tract infection, site not specified: Secondary | ICD-10-CM

## 2012-03-29 DIAGNOSIS — R634 Abnormal weight loss: Secondary | ICD-10-CM

## 2012-03-29 NOTE — Patient Instructions (Addendum)
Please follow up in 1 month.  Weigh at least once a week. Call if your weight drops below 89 pounds.   Continue Ensure.

## 2012-03-29 NOTE — Progress Notes (Signed)
Subjective:    Patient ID: Leah Harper, female    DOB: [birth year], 78 y.o.   MRN: [id number]  HPI  Ms.  Leah Harper is an 78 yr old female with hx of dementia who presents today with her husband for her 1 week follow up appointment of her bronchitis and UTI.  Urine culture grew 100K of klebsiella which was cipro sensitive. Pt complete cipro without difficulty.  Husband reports improvement in cough.  Slight improvement in her appetite.  He has not been successful in convincing pt to take the Remeron. The patient is drinking Ensure and her weight has remained stable at 89 pounds this week.    Review of Systems See HPI  Past Medical History  Diagnosis Date  . Hyperlipidemia   . Hypertension   . AF (atrial fibrillation)   . Hyperthyroidism     remotely in 1941  . Normocytic anemia     2002  . S/P TAH-BSO (total abdominal hysterectomy and bilateral salpingo-oophorectomy)   . Allergy   . Arrhythmia   . Anxiety     History   Social History  . Marital Status: Married    Spouse Name: [name]    Number of Children: 3  . Years of Education: N/A   Occupational History  . retired    Social History Main Topics  . Smoking status: Former Smoker    Quit date: 07/17/1963  . Smokeless tobacco: Never Used  . Alcohol Use: No  . Drug Use: No  . Sexually Active: Not on file   Other Topics Concern  . Not on file   Social History Narrative   RetiredMarried -3 children Tobacco abuse-noAlcohol use-no  Regular exercise-no    Past Surgical History  Procedure Date  . Tonsillectomy   . Total abdominal hysterectomy w/ bilateral salpingoophorectomy 1986  . Bilateral salpingoophorectomy   . Bladder suspension     bladder tack with TAHBSO    Family History  Problem Relation Age of Onset  . Heart disease Father   . Heart disease Brother     heart attack  . Heart disease Brother     Allergies  Allergen Reactions  . Statins     Myalgia     Current Outpatient Prescriptions on  File Prior to Visit  Medication Sig Dispense Refill  . ALPRAZolam (XANAX) 0.25 MG tablet Take 0.25 mg by mouth 2 (two) times daily as needed. For anxiety      . aspirin 81 MG tablet Take 81 mg by mouth daily.        . ciprofloxacin (CIPRO) 250 MG tablet Take 1 tablet (250 mg total) by mouth 2 (two) times daily.  10 tablet  0  . Docusate Sodium (DSS) 100 MG CAPS Take 100 mg by mouth 2 (two) times daily.      Marland Kitchen ENSURE (ENSURE) Take 237 mLs by mouth 2 (two) times daily between meals.        Marland Kitchen ezetimibe (ZETIA) 10 MG tablet Take 1 tablet (10 mg total) by mouth daily.  90 tablet  0  . metoprolol tartrate (LOPRESSOR) 25 MG tablet Take 1 tablet (25 mg total) by mouth daily.  90 tablet  1  . mirtazapine (REMERON) 7.5 MG tablet Take 1 tablet (7.5 mg total) by mouth at bedtime.  30 tablet  5    BP 142/72  Pulse 89  Temp 97.8 F (36.6 C)  Wt 89 lb 1.3 oz (40.406 kg)  SpO2 99%  Objective:   Physical Exam  Constitutional: She appears well-developed. No distress.       Thin, elderly white female.  Cardiovascular: Normal rate and regular rhythm.   No murmur heard. Pulmonary/Chest: Effort normal and breath sounds normal. No respiratory distress. She has no wheezes. She has no rales. She exhibits no tenderness.  Abdominal: Soft. Bowel sounds are normal. She exhibits no distension and no mass. There is no tenderness. There is no rebound and no guarding.  Musculoskeletal: She exhibits no edema.  Neurological: She is alert.          Assessment & Plan:

## 2012-03-31 NOTE — Assessment & Plan Note (Signed)
Clinically stable, s/p cipro.

## 2012-03-31 NOTE — Assessment & Plan Note (Signed)
Stable,  Encouraged pt to resume remeron to help with mood and appetite.  She tells me she will continue.  Husband will keep a close eye on her diet and weight.

## 2012-03-31 NOTE — Assessment & Plan Note (Signed)
Clinically resolved. S/p cipro rx.

## 2012-04-15 ENCOUNTER — Encounter: Payer: Self-pay | Admitting: Family

## 2012-04-15 ENCOUNTER — Ambulatory Visit (INDEPENDENT_AMBULATORY_CARE_PROVIDER_SITE_OTHER): Payer: 59 | Admitting: Family

## 2012-04-15 VITALS — BP 98/70 | HR 60 | Temp 97.7°F | Resp 16 | Wt 90.0 lb

## 2012-04-15 DIAGNOSIS — F329 Major depressive disorder, single episode, unspecified: Secondary | ICD-10-CM

## 2012-04-15 MED ORDER — CITALOPRAM HYDROBROMIDE 10 MG PO TABS: 10.0000 mg | ORAL_TABLET | Freq: Every day | ORAL | Status: DC

## 2012-04-15 NOTE — Assessment & Plan Note (Signed)
Trial of citalopram.  Will stop remeron for now.  15 minutes spent with pt.  >50% of this time was spent counseling pt and husband on depression.  Common side effects of citalopram discussed with pt and husband.

## 2012-04-15 NOTE — Progress Notes (Signed)
  Subjective:    Patient ID: Leah Harper, female    DOB: 28-Jul-1919, 78 y.o.   MRN: [id number]  HPI  [name] is an 78 yr old female who presents today with her husband who is concerned about poor appetite, trouble getting out of bed and tearfulness.  She started back on the remeron 4 days ago.  Last few AM's has been tearful in the AM.   Review of Systems See HPI    Objective:   Physical Exam  Gen: awake, alert, NAD- thin white female Psych: flat affect      Assessment & Plan:

## 2012-04-15 NOTE — Patient Instructions (Addendum)
Stop remeron, start citalopram. Follow up in 1 month- call sooner if depressive symptoms worsen or if not feeling better in a few weeks.

## 2012-04-24 ENCOUNTER — Telehealth: Payer: Self-pay | Admitting: *Deleted

## 2012-04-24 NOTE — Telephone Encounter (Signed)
Try cutting dose in half and taking 5mg  of citalopram.  Let me know how she is doing in 1 week, call sooner if symptoms worsen.

## 2012-04-24 NOTE — Telephone Encounter (Signed)
Received message from pt's husband that he thinks Celexa is making pt's symptoms worse. Reports that she is like a zombie, has great difficulty getting her to do anything. Pt doesn't want to get out of bed or leave the house. Wants to reduce medication or change it? He states we may leave message on voicemail.  Please advise.

## 2012-04-24 NOTE — Telephone Encounter (Signed)
Attempted to reach pt's husband and was unable to leave message at home # (received continuous beep) and cell voicemail not set up to receive message.

## 2012-04-24 NOTE — Telephone Encounter (Signed)
Spoke with Leah Harper and advised him of instructions below. He voices understanding and will update [place] appropriately.

## 2012-04-29 ENCOUNTER — Ambulatory Visit: Payer: Medicare Other | Admitting: Family

## 2012-05-17 ENCOUNTER — Ambulatory Visit: Payer: Medicare Other | Admitting: Family

## 2012-07-01 ENCOUNTER — Telehealth: Payer: Self-pay | Admitting: Family

## 2012-07-01 MED ORDER — EZETIMIBE 10 MG PO TABS: 10.0000 mg | ORAL_TABLET | Freq: Every day | ORAL | Status: DC

## 2012-07-01 MED ORDER — METOPROLOL TARTRATE 25 MG PO TABS: 25.0000 mg | ORAL_TABLET | Freq: Every day | ORAL | Status: DC

## 2012-07-01 NOTE — Telephone Encounter (Signed)
Patients husband called in stating that he would like a refill of zetia and metoprolol to be sent to [facility]

## 2012-07-01 NOTE — Telephone Encounter (Signed)
Refills sent

## 2012-07-06 ENCOUNTER — Encounter (HOSPITAL_BASED_OUTPATIENT_CLINIC_OR_DEPARTMENT_OTHER): Payer: Self-pay | Admitting: *Deleted

## 2012-07-06 ENCOUNTER — Emergency Department (HOSPITAL_BASED_OUTPATIENT_CLINIC_OR_DEPARTMENT_OTHER)
Admission: EM | Admit: 2012-07-06 | Discharge: 2012-07-06 | Disposition: A | Discharge status: Home / Self Care | Payer: Medicare Other | Attending: Emergency Medicine | Admitting: Emergency Medicine

## 2012-07-06 ENCOUNTER — Emergency Department (HOSPITAL_BASED_OUTPATIENT_CLINIC_OR_DEPARTMENT_OTHER): Payer: Medicare Other

## 2012-07-06 DIAGNOSIS — Z79899 Other long term (current) drug therapy: Secondary | ICD-10-CM | POA: Insufficient documentation

## 2012-07-06 DIAGNOSIS — F411 Generalized anxiety disorder: Secondary | ICD-10-CM | POA: Insufficient documentation

## 2012-07-06 DIAGNOSIS — Z7982 Long term (current) use of aspirin: Secondary | ICD-10-CM | POA: Insufficient documentation

## 2012-07-06 DIAGNOSIS — E785 Hyperlipidemia, unspecified: Secondary | ICD-10-CM | POA: Insufficient documentation

## 2012-07-06 DIAGNOSIS — R079 Chest pain, unspecified: Secondary | ICD-10-CM | POA: Insufficient documentation

## 2012-07-06 DIAGNOSIS — R0781 Pleurodynia: Secondary | ICD-10-CM

## 2012-07-06 DIAGNOSIS — Z862 Personal history of diseases of the blood and blood-forming organs and certain disorders involving the immune mechanism: Secondary | ICD-10-CM | POA: Insufficient documentation

## 2012-07-06 DIAGNOSIS — Z8679 Personal history of other diseases of the circulatory system: Secondary | ICD-10-CM | POA: Insufficient documentation

## 2012-07-06 DIAGNOSIS — E0590 Thyrotoxicosis, unspecified without thyrotoxic crisis or storm: Secondary | ICD-10-CM | POA: Insufficient documentation

## 2012-07-06 DIAGNOSIS — I4891 Unspecified atrial fibrillation: Secondary | ICD-10-CM | POA: Insufficient documentation

## 2012-07-06 DIAGNOSIS — I10 Essential (primary) hypertension: Secondary | ICD-10-CM | POA: Insufficient documentation

## 2012-07-06 DIAGNOSIS — Z87891 Personal history of nicotine dependence: Secondary | ICD-10-CM | POA: Insufficient documentation

## 2012-07-06 LAB — COMPREHENSIVE METABOLIC PANEL
ALT: 19 U/L (ref 0–35)
Albumin: 3.5 g/dL (ref 3.5–5.2)
Alkaline Phosphatase: 94 U/L (ref 39–117)
BUN: 34 mg/dL — ABNORMAL HIGH (ref 6–23)
Chloride: 103 mEq/L (ref 96–112)
Glucose, Bld: 108 mg/dL — ABNORMAL HIGH (ref 70–99)
Potassium: 4.3 mEq/L (ref 3.5–5.1)
Sodium: 139 mEq/L (ref 135–145)
Total Bilirubin: 0.6 mg/dL (ref 0.3–1.2)
Total Protein: 7.6 g/dL (ref 6.0–8.3)

## 2012-07-06 LAB — CBC WITH DIFFERENTIAL/PLATELET
Basophils Relative: 0 % (ref 0–1)
Eosinophils Absolute: 0.1 10*3/uL (ref 0.0–0.7)
Hemoglobin: 14.5 g/dL (ref 12.0–15.0)
Lymphs Abs: 1.7 10*3/uL (ref 0.7–4.0)
MCH: 32 pg (ref 26.0–34.0)
Monocytes Relative: 9 % (ref 3–12)
Neutro Abs: 4.1 10*3/uL (ref 1.7–7.7)
Neutrophils Relative %: 63 % (ref 43–77)
Platelets: 189 10*3/uL (ref 150–400)
RBC: 4.53 MIL/uL (ref 3.87–5.11)
WBC: 6.5 10*3/uL (ref 4.0–10.5)

## 2012-07-06 LAB — URINE MICROSCOPIC-ADD ON

## 2012-07-06 LAB — URINALYSIS, ROUTINE W REFLEX MICROSCOPIC
Bilirubin Urine: NEGATIVE
Hgb urine dipstick: NEGATIVE
Ketones, ur: NEGATIVE mg/dL
Nitrite: NEGATIVE
Specific Gravity, Urine: 1.018 (ref 1.005–1.030)
Urobilinogen, UA: 0.2 mg/dL (ref 0.0–1.0)

## 2012-07-06 MED ORDER — ALPRAZOLAM 0.5 MG PO TABS
0.5000 mg | ORAL_TABLET | Freq: Once | ORAL | Status: AC
  Administered 2012-07-06: 0.5 mg via ORAL
  Filled 2012-07-06: qty 1

## 2012-07-06 NOTE — ED Provider Notes (Signed)
History     CSN: [id number]  Arrival date & time 07/06/12  1313   First MD Initiated Contact with Patient 07/06/12 1328      Chief Complaint  Patient presents with  . Back Pain    (Consider location/radiation/quality/duration/timing/severity/associated sxs/prior treatment) Patient is a 79 y.o. female presenting with chest pain. The history is provided by the patient. No language interpreter was used.  Chest Pain The chest pain began 6 - 12 hours ago. Chest pain occurs constantly. The chest pain is worsening. The pain is associated with exertion. At its most intense, the pain is at 8/10. The severity of the pain is severe. The quality of the pain is described as aching. The pain does not radiate. Chest pain is worsened by certain positions. She tried nothing for the symptoms. Risk factors include being elderly.  Her past medical history is significant for hyperlipidemia and hypertension.   Pt reports pain in right side.   Pt's husband concerned that pt has lost a lot of weight.   (Pt's primary is aware and has been monitoring)   Past Medical History  Diagnosis Date  . Hyperlipidemia   . Hypertension   . AF (atrial fibrillation)   . Hyperthyroidism     remotely in 1941  . Normocytic anemia     2002  . S/P TAH-BSO (total abdominal hysterectomy and bilateral salpingo-oophorectomy)   . Allergy   . Arrhythmia   . Anxiety     Past Surgical History  Procedure Date  . Tonsillectomy   . Total abdominal hysterectomy w/ bilateral salpingoophorectomy 1986  . Bilateral salpingoophorectomy   . Bladder suspension     bladder tack with TAHBSO    Family History  Problem Relation Age of Onset  . Heart disease Father   . Heart disease Brother     heart attack  . Heart disease Brother     History  Substance Use Topics  . Smoking status: Former Smoker    Quit date: 07/17/1963  . Smokeless tobacco: Never Used  . Alcohol Use: No    OB History    Grav Para Term Preterm Abortions  TAB SAB Ect Mult Living                  Review of Systems  Cardiovascular: Positive for chest pain.  All other systems reviewed and are negative.    Allergies  Statins  Home Medications   Current Outpatient Rx  Name  Route  Sig  Dispense  Refill  . ALPRAZOLAM 0.25 MG PO TABS   Oral   Take 0.25 mg by mouth 2 (two) times daily as needed. For anxiety         . ASPIRIN 81 MG PO TABS   Oral   Take 81 mg by mouth daily.           Marland Kitchen BENZONATATE 100 MG PO CAPS   Oral   Take 100 mg by mouth 3 (three) times daily as needed.         Marland Kitchen CITALOPRAM HYDROBROMIDE 10 MG PO TABS   Oral   Take 1 tablet (10 mg total) by mouth daily.   30 tablet   1   . ENSURE PO LIQD   Oral   Take 237 mLs by mouth 2 (two) times daily between meals.           Marland Kitchen EZETIMIBE 10 MG PO TABS   Oral   Take 1 tablet (10 mg total) by mouth  daily.   90 tablet   0   . METOPROLOL TARTRATE 25 MG PO TABS   Oral   Take 1 tablet (25 mg total) by mouth daily.   90 tablet   0     BP 164/78  Pulse 60  Temp 97.5 F (36.4 C) (Oral)  Resp 20  Ht 5\' 6"  (1.676 m)  Wt 84 lb (38.102 kg)  BMI 13.56 kg/m2  SpO2 100%  Physical Exam  Nursing note and vitals reviewed. Constitutional: She appears well-developed.       thin  HENT:  Right Ear: External ear normal.  Mouth/Throat: Oropharynx is clear and moist.  Eyes: Pupils are equal, round, and reactive to light.  Neck: Normal range of motion.  Cardiovascular: Normal rate.   Pulmonary/Chest: Effort normal and breath sounds normal. She exhibits tenderness.       Tender right rib area to thoracic spine right side  Abdominal: Soft. Bowel sounds are normal.  Musculoskeletal: Normal range of motion.  Neurological: She is alert.       confused  Skin: Skin is warm.  Psychiatric:       anxious    ED Course  Procedures (including critical care time)  Labs Reviewed - No data to display No results found.   1. Rib pain     Date: 07/06/2012  Rate:  76  Rhythm: normal sinus rhythm  QRS Axis: normal  Intervals: normal  ST/T Wave abnormalities: ST depressions diffusely  Conduction Disutrbances:none  Narrative Interpretation:   Old EKG Reviewed: unchanged and no change since 2011, followed by cardiology  Results for orders placed during the hospital encounter of 07/06/12  CBC WITH DIFFERENTIAL      Component Value Range   WBC 6.5  4.0 - 10.5 K/uL   RBC 4.53  3.87 - 5.11 MIL/uL   Hemoglobin 14.5  12.0 - 15.0 g/dL   HCT 16.1  09.6 - 04.5 %   MCV 92.9  78.0 - 100.0 fL   MCH 32.0  26.0 - 34.0 pg   MCHC 34.4  30.0 - 36.0 g/dL   RDW 40.9  81.1 - 91.4 %   Platelets 189  150 - 400 K/uL   Neutrophils Relative 63  43 - 77 %   Neutro Abs 4.1  1.7 - 7.7 K/uL   Lymphocytes Relative 27  12 - 46 %   Lymphs Abs 1.7  0.7 - 4.0 K/uL   Monocytes Relative 9  3 - 12 %   Monocytes Absolute 0.6  0.1 - 1.0 K/uL   Eosinophils Relative 1  0 - 5 %   Eosinophils Absolute 0.1  0.0 - 0.7 K/uL   Basophils Relative 0  0 - 1 %   Basophils Absolute 0.0  0.0 - 0.1 K/uL  COMPREHENSIVE METABOLIC PANEL      Component Value Range   Sodium 139  135 - 145 mEq/L   Potassium 4.3  3.5 - 5.1 mEq/L   Chloride 103  96 - 112 mEq/L   CO2 24  19 - 32 mEq/L   Glucose, Bld 108 (*) 70 - 99 mg/dL   BUN 34 (*) 6 - 23 mg/dL   Creatinine, Ser 7.82 (*) 0.50 - 1.10 mg/dL   Calcium 95.6  8.4 - 21.3 mg/dL   Total Protein 7.6  6.0 - 8.3 g/dL   Albumin 3.5  3.5 - 5.2 g/dL   AST 31  0 - 37 U/L   ALT 19  0 - 35 U/L  Alkaline Phosphatase 94  39 - 117 U/L   Total Bilirubin 0.6  0.3 - 1.2 mg/dL   GFR calc non Af Amer 27 (*) >90 mL/min   GFR calc Af Amer 32 (*) >90 mL/min  URINALYSIS, ROUTINE W REFLEX MICROSCOPIC      Component Value Range   Color, Urine YELLOW  YELLOW   APPearance CLEAR  CLEAR   Specific Gravity, Urine 1.018  1.005 - 1.030   pH 7.0  5.0 - 8.0   Glucose, UA NEGATIVE  NEGATIVE mg/dL   Hgb urine dipstick NEGATIVE  NEGATIVE   Bilirubin Urine NEGATIVE  NEGATIVE     Ketones, ur NEGATIVE  NEGATIVE mg/dL   Protein, ur 30 (*) NEGATIVE mg/dL   Urobilinogen, UA 0.2  0.0 - 1.0 mg/dL   Nitrite NEGATIVE  NEGATIVE   Leukocytes, UA NEGATIVE  NEGATIVE  TROPONIN I      Component Value Range   Troponin I <0.30  <0.30 ng/mL  URINE MICROSCOPIC-ADD ON      Component Value Range   Squamous Epithelial / LPF RARE  RARE   RBC / HPF 0-2  <3 RBC/hpf   Dg Chest 2 View  07/06/2012  *RADIOLOGY REPORT*  Clinical Data: Pain in right side  CHEST - 2 VIEW  Comparison: 03/22/2012  Findings: Decreased lung volumes.  Asymmetric elevation of the left hemidiaphragm noted.  Chronic interstitial coarsening is noted bilaterally compatible with COPD.  No focal airspace consolidation. Review of the visualized osseous structures is significant for mild thoracic spondylosis.  IMPRESSION:  1.  COPD/emphysema. 2.  No pneumonia.   Original Report Authenticated By: Signa Kell, M.D.      MDM  I discussed options with pt and husband.   Pt wants to go home.   Pt's husband states he was worried about pneumonia and wants pt at home.   Pt was tearful and anxious on arrival.   Pt takes xanax at home.   Pt was given a dosage here.   Pt had decreased anxiety and relief of symptoms  (I suspect pain was muscular)  Pt was shopping with daughter yesterday and husband reports he thinks pt over exerted herself.  Pt's weight was 90 pounds in October and is 49 today.   I advised recheck with primary.  I encouraged husband to increase pt's calories.          Lonia Skinner [place], [place] 07/06/12 1621  Lonia Skinner [place], [place] 07/06/12 [phone]

## 2012-07-06 NOTE — ED Notes (Signed)
ED PA at bedside

## 2012-07-06 NOTE — ED Notes (Signed)
Performed in and out catheterization using sterile technique.  Pt tolerated well.  Specimen sent to lab.

## 2012-07-06 NOTE — ED Notes (Signed)
Husband states that pt has "lost a lot of wt over the past 6-8 months and PCP cannot figure out why". Yesterday went out with daughter and this a.m. Began c/o right rib/back pain. Husband is worried about dehydration and pneumonia. Weak.

## 2012-07-07 LAB — URINE CULTURE: Colony Count: NO GROWTH

## 2012-07-07 NOTE — ED Provider Notes (Signed)
Medical screening examination/treatment/procedure(s) were performed by non-physician practitioner and as supervising physician I was immediately available for consultation/collaboration.   Charles B. Sheldon, MD 07/07/12 1412 

## 2012-07-08 ENCOUNTER — Telehealth: Payer: Self-pay | Admitting: Family

## 2012-07-08 NOTE — Telephone Encounter (Signed)
Could not leave message on either phone. Will try again later

## 2012-07-08 NOTE — Telephone Encounter (Signed)
Pls call pt and arrange a 1 week ED follow up visit.

## 2012-07-09 ENCOUNTER — Encounter: Payer: Self-pay | Admitting: Family

## 2012-07-09 ENCOUNTER — Ambulatory Visit (INDEPENDENT_AMBULATORY_CARE_PROVIDER_SITE_OTHER): Payer: 59 | Admitting: Family

## 2012-07-09 VITALS — BP 110/60 | HR 51 | Temp 97.1°F | Resp 16 | Wt 91.0 lb

## 2012-07-09 DIAGNOSIS — F0390 Unspecified dementia without behavioral disturbance: Secondary | ICD-10-CM

## 2012-07-09 DIAGNOSIS — R634 Abnormal weight loss: Secondary | ICD-10-CM

## 2012-07-09 DIAGNOSIS — F329 Major depressive disorder, single episode, unspecified: Secondary | ICD-10-CM

## 2012-07-09 DIAGNOSIS — IMO0001 Reserved for inherently not codable concepts without codable children: Secondary | ICD-10-CM

## 2012-07-09 DIAGNOSIS — M7918 Myalgia, other site: Secondary | ICD-10-CM | POA: Insufficient documentation

## 2012-07-09 NOTE — Assessment & Plan Note (Signed)
We again discussed possibility of referral to neuro and/or addition of medication for dementia such as aricept.  Pt declines at this time, husband wishes to discuss with the family and will let me know.

## 2012-07-09 NOTE — Patient Instructions (Addendum)
Drink ensure twice daily in addition to your meals. You may use tylenol as needed for muscle pain in your side.  Call if symptoms worsen or do not continue to improve.   Follow up in 2 months, sooner if problems/concerns.

## 2012-07-09 NOTE — Progress Notes (Signed)
Subjective:    Patient ID: Leah Harper, female    DOB: 1920/05/17, 79 y.o.   MRN: [id number]  HPI  Leah Harper is a 79 yr old female who presents today for ED follow up. She was seen on 1/11 in the ED for same.  Work up is reviewed and included a negative urine culture, negative tryponin and CXR without infiltrate.  She was noted to have creatinine of 1.6 which is at baseline. Weight today is actually up one pound since her last visit with [place] in October. Appetite remains poor.    Pt describes a right sided flank pain which has "moved down just a little bit."  She reports that the pain is improved.    Anxiety- uses xanax sparingly which helps.    Depression- husband reports that she was too sleepy on this medication.  He stopped the medication.      Review of Systems See HPI  Past Medical History  Diagnosis Date  . Hyperlipidemia   . Hypertension   . AF (atrial fibrillation)   . Hyperthyroidism     remotely in 1941  . Normocytic anemia     2002  . S/P TAH-BSO (total abdominal hysterectomy and bilateral salpingo-oophorectomy)   . Allergy   . Arrhythmia   . Anxiety     History   Social History  . Marital Status: Married    Spouse Name: [name]    Number of Children: 3  . Years of Education: N/A   Occupational History  . retired    Social History Main Topics  . Smoking status: Former Smoker    Quit date: 07/17/1963  . Smokeless tobacco: Never Used  . Alcohol Use: No  . Drug Use: No  . Sexually Active: Not on file   Other Topics Concern  . Not on file   Social History Narrative   RetiredMarried -3 children Tobacco abuse-noAlcohol use-no  Regular exercise-no    Past Surgical History  Procedure Date  . Tonsillectomy   . Total abdominal hysterectomy w/ bilateral salpingoophorectomy 1986  . Bilateral salpingoophorectomy   . Bladder suspension     bladder tack with TAHBSO    Family History  Problem Relation Age of Onset  . Heart disease Father   .  Heart disease Brother     heart attack  . Heart disease Brother     Allergies  Allergen Reactions  . Statins     Myalgia     Current Outpatient Prescriptions on File Prior to Visit  Medication Sig Dispense Refill  . ALPRAZolam (XANAX) 0.25 MG tablet Take 0.25 mg by mouth 2 (two) times daily as needed. For anxiety      . aspirin 81 MG tablet Take 81 mg by mouth daily.        Marland Kitchen ENSURE (ENSURE) Take 237 mLs by mouth 2 (two) times daily between meals.        Marland Kitchen ezetimibe (ZETIA) 10 MG tablet Take 1 tablet (10 mg total) by mouth daily.  90 tablet  0  . metoprolol tartrate (LOPRESSOR) 25 MG tablet Take 1 tablet (25 mg total) by mouth daily.  90 tablet  0    BP 110/60  Pulse 51  Temp 97.1 F (36.2 C) (Oral)  Resp 16  Wt 91 lb (41.277 kg)  SpO2 99%       Objective:   Physical Exam  Constitutional: She is oriented to person, place, and time. She appears well-developed and well-nourished. No distress.  HENT:  Head: Normocephalic and atraumatic.  Cardiovascular: Normal rate and regular rhythm.   No murmur heard. Pulmonary/Chest: Effort normal and breath sounds normal. No respiratory distress. She has no wheezes. She has no rales. She exhibits no tenderness.  Musculoskeletal: She exhibits no edema.       Mild right sided rib tenderness to palpation  Lymphadenopathy:    She has no cervical adenopathy.  Neurological: She is alert and oriented to person, place, and time.  Psychiatric: She has a normal mood and affect. Her behavior is normal. Judgment and thought content normal.          Assessment & Plan:

## 2012-07-09 NOTE — Assessment & Plan Note (Signed)
Up a pound today.  Encouraged her to drink 2 ensures in addition to her meals.  Husband to monitor her weight.

## 2012-07-09 NOTE — Assessment & Plan Note (Signed)
Improving.  PRN tylenol.

## 2012-07-09 NOTE — Assessment & Plan Note (Signed)
Fair control at this time.  Husband wishes to keep her off of any medication for depression at this time.  He finds a prn xanax to be adequate for her anxiety.

## 2012-07-11 NOTE — Telephone Encounter (Signed)
Pt was seen in office- no need to call.

## 2012-07-18 ENCOUNTER — Telehealth: Payer: Self-pay | Admitting: *Deleted

## 2012-07-18 DIAGNOSIS — R413 Other amnesia: Secondary | ICD-10-CM

## 2012-07-18 NOTE — Telephone Encounter (Signed)
Notified pt's husband and he requests that appt be made for the [facility] location. They will come by one day next week to update HIPPA contacts.

## 2012-07-18 NOTE — Telephone Encounter (Signed)
Received letter from pt's husband stating family agrees that pt needs referral to neurologist. They have chosed Richard Engineer, mining at [facility]. Pt wants to know if you agree with this choice or do you have someone else you would recommend?  Pt's husband also requests in the letter to add their children to [place] HIPPA:  Sofie Hartigan and Huel Cote Darin. Attempted to confirm request with pt's husband but did not receive and answer.  Please advise.

## 2012-07-18 NOTE — Telephone Encounter (Signed)
Lets have pt sign hippa form next visit to include her children.  If they would like to go to [facility], then I would recommend Dr. Loyal Gambler as she specializes in memory loss. Pended below.

## 2012-07-30 ENCOUNTER — Encounter: Payer: Self-pay | Admitting: Family

## 2012-07-30 ENCOUNTER — Ambulatory Visit (INDEPENDENT_AMBULATORY_CARE_PROVIDER_SITE_OTHER): Payer: Medicare Other | Admitting: Family

## 2012-07-30 ENCOUNTER — Ambulatory Visit (HOSPITAL_BASED_OUTPATIENT_CLINIC_OR_DEPARTMENT_OTHER)
Admission: RE | Admit: 2012-07-30 | Discharge: 2012-07-30 | Disposition: A | Discharge status: Home / Self Care | Payer: Medicare Other | Source: Ambulatory Visit | Attending: Family | Admitting: Family

## 2012-07-30 VITALS — BP 144/80 | HR 66 | Temp 97.7°F | Resp 24 | Wt 92.0 lb

## 2012-07-30 DIAGNOSIS — R05 Cough: Secondary | ICD-10-CM

## 2012-07-30 DIAGNOSIS — R5381 Other malaise: Secondary | ICD-10-CM

## 2012-07-30 DIAGNOSIS — R059 Cough, unspecified: Secondary | ICD-10-CM | POA: Insufficient documentation

## 2012-07-30 DIAGNOSIS — R9431 Abnormal electrocardiogram [ECG] [EKG]: Secondary | ICD-10-CM

## 2012-07-30 DIAGNOSIS — N390 Urinary tract infection, site not specified: Secondary | ICD-10-CM

## 2012-07-30 DIAGNOSIS — R0602 Shortness of breath: Secondary | ICD-10-CM

## 2012-07-30 DIAGNOSIS — J438 Other emphysema: Secondary | ICD-10-CM | POA: Insufficient documentation

## 2012-07-30 DIAGNOSIS — R531 Weakness: Secondary | ICD-10-CM

## 2012-07-30 LAB — POCT URINALYSIS DIPSTICK
Bilirubin, UA: NEGATIVE
Glucose, UA: NEGATIVE
Ketones, UA: NEGATIVE
Nitrite, UA: NEGATIVE
Protein, UA: 30
Spec Grav, UA: 1.02
Urobilinogen, UA: 0.2
pH, UA: 6

## 2012-07-30 MED ORDER — CEFUROXIME AXETIL 250 MG PO TABS: 250.0000 mg | ORAL_TABLET | Freq: Two times a day (BID) | ORAL | Status: DC

## 2012-07-30 MED ORDER — ISOSORBIDE MONONITRATE ER 30 MG PO TB24: 30.0000 mg | ORAL_TABLET | Freq: Every day | ORAL | Status: DC

## 2012-07-30 NOTE — Assessment & Plan Note (Signed)
UA notes small leuks and trace blood. She could not provide enough urine for culture. Will plan to rx empirically with ceftin.

## 2012-07-30 NOTE — Assessment & Plan Note (Signed)
Reviewed pt case and  today's EKG and 07/06/12 EKG with Dr.  Patty Sermons (cardiology).  He recommended cutting back on pt's metoprolol to 12.5mg  and adding imdur 30mg  once daily.  Also arranged a follow up appointment with cardiology.

## 2012-07-30 NOTE — Progress Notes (Signed)
Subjective:    Patient ID: Leah Harper, female    DOB: 1919/10/18, 79 y.o.   MRN: [id number]  HPI  79 yr old female with cough.  Husband reports that she has been coughing x 4-5 days.  She has tried some OTC cough syrup without improvement.  Denies nasal drainage.  Energy is poor. Denies fever.   She is tolerating PO's.  "So-So." per husband.  She reports some sore throat.  Husband has not been sick.   Review of Systems    see HPI  Past Medical History  Diagnosis Date  . Hyperlipidemia   . Hypertension   . AF (atrial fibrillation)   . Hyperthyroidism     remotely in 1941  . Normocytic anemia     2002  . S/P TAH-BSO (total abdominal hysterectomy and bilateral salpingo-oophorectomy)   . Allergy   . Arrhythmia   . Anxiety     History   Social History  . Marital Status: Married    Spouse Name: [name]    Number of Children: 3  . Years of Education: N/A   Occupational History  . retired    Social History Main Topics  . Smoking status: Former Smoker    Quit date: 07/17/1963  . Smokeless tobacco: Never Used  . Alcohol Use: No  . Drug Use: No  . Sexually Active: Not on file   Other Topics Concern  . Not on file   Social History Narrative   RetiredMarried -3 children Tobacco abuse-noAlcohol use-no  Regular exercise-no    Past Surgical History  Procedure Date  . Tonsillectomy   . Total abdominal hysterectomy w/ bilateral salpingoophorectomy 1986  . Bilateral salpingoophorectomy   . Bladder suspension     bladder tack with TAHBSO    Family History  Problem Relation Age of Onset  . Heart disease Father   . Heart disease Brother     heart attack  . Heart disease Brother     Allergies  Allergen Reactions  . Statins     Myalgia     Current Outpatient Prescriptions on File Prior to Visit  Medication Sig Dispense Refill  . ALPRAZolam (XANAX) 0.25 MG tablet Take 0.25 mg by mouth 2 (two) times daily as needed. For anxiety      . aspirin 81 MG tablet  Take 81 mg by mouth daily.        Marland Kitchen ENSURE (ENSURE) Take 237 mLs by mouth 2 (two) times daily between meals.        Marland Kitchen ezetimibe (ZETIA) 10 MG tablet Take 1 tablet (10 mg total) by mouth daily.  90 tablet  0  . metoprolol tartrate (LOPRESSOR) 25 MG tablet Take 1 tablet (25 mg total) by mouth daily.  90 tablet  0    BP 144/80  Pulse 66  Temp 97.7 F (36.5 C) (Oral)  Resp 24  Wt 92 lb (41.731 kg)  SpO2 99%    Objective:   Physical Exam  Constitutional: She is oriented to person, place, and time.       Awake, alert, appears nervous and short of breath.  Cardiovascular: An irregularly irregular rhythm present.  Pulmonary/Chest: No respiratory distress. She has no wheezes. She has no rales. She exhibits no tenderness.       Increased work of breathing but tearful and nervous.  Abdominal: Soft. Bowel sounds are normal.  Musculoskeletal: She exhibits no edema.  Lymphadenopathy:    She has no cervical adenopathy.  Neurological: She is alert  and oriented to person, place, and time.  Skin: Skin is warm and dry. No rash noted. No erythema. No pallor.  Psychiatric: She has a normal mood and affect. Her behavior is normal. Judgment and thought content normal.       Tearful, nervous appearing.           Assessment & Plan:

## 2012-07-30 NOTE — Patient Instructions (Addendum)
Start imdur once daily. Cut your metoprolol 25mg  tab in half and take 1/2 tablet by mouth daily. Start ceftin twice daily for possible mild UTI. Follow up with cardiology as scheduled.   Follow up with our office in 1 week. Call if symptoms worsen- go to the ER if severe.

## 2012-08-06 ENCOUNTER — Ambulatory Visit: Payer: Medicare Other | Admitting: Family

## 2012-08-13 ENCOUNTER — Encounter: Payer: Self-pay | Admitting: Physician Assistant

## 2012-08-13 ENCOUNTER — Ambulatory Visit (INDEPENDENT_AMBULATORY_CARE_PROVIDER_SITE_OTHER): Payer: Medicare Other | Admitting: Physician Assistant

## 2012-08-13 VITALS — BP 172/68 | HR 54 | Ht 66.0 in | Wt 93.4 lb

## 2012-08-13 DIAGNOSIS — I10 Essential (primary) hypertension: Secondary | ICD-10-CM

## 2012-08-13 DIAGNOSIS — R9431 Abnormal electrocardiogram [ECG] [EKG]: Secondary | ICD-10-CM

## 2012-08-13 DIAGNOSIS — I498 Other specified cardiac arrhythmias: Secondary | ICD-10-CM

## 2012-08-13 MED ORDER — METOPROLOL TARTRATE 25 MG PO TABS: ORAL_TABLET | ORAL | Status: DC

## 2012-08-13 NOTE — Progress Notes (Signed)
[address] [place], [place]  [address] Phone: [phone], Fax:  [phone]  Date:  08/13/2012   ID:  Nitzia, Perren 04-15-1920, MRN [id number]  PCP:  Lemont Fillers., NP  Primary Cardiologist:  Dr. Charlton Haws    History of Present Illness: Leah Harper is a 79 y.o. female who returns for follow up.  She has a hx of HTN, HL, remote hypothyroidism, CKD. She has a hx of non-ischemic nuclear study in 2003.  Echo 1/13: Mild LVH, EF 60%, aortic sclerosis without aortic stenosis, mild AI, question RV dysfunction, trivial pericardial effusion. Last seen by Dr. Eden Emms 2/13. She was asked to follow up as needed.  Recently seen by PCP with decreased energy. Notes indicate she had abnormal EKG and the case was discussed with Dr. Patty Sermons. Her metoprolol was reduced and she was placed on isosorbide.  Patient and her husband are not sure why she is here today.  She denies a hx of chest pain, dyspnea, syncope, near syncope, dizziness, falling, orthopnea, PND, edema.  She has not been able to take the Imdur as Rx.  She also was placed on Ceftin but has not been able to take this.  She has trouble swallowing pills.  She has "white coat HTN."  Husband checks her BP and it is typically 130/80s.  Their cuff collaborates with the BP readings at the local fire station.   CXR 07/30/12: IMPRESSION: No acute finding. Emphysema  Labs (1/13):  TSH 1.823 Labs (1/14):  K 4.3, creatinine 1.60, ALT 19, Hgb 14.5,   Wt Readings from Last 3 Encounters:  08/13/12 93 lb 6.4 oz (42.366 kg)  07/30/12 92 lb (41.731 kg)  07/09/12 91 lb (41.277 kg)     Past Medical History  Diagnosis Date  . Hyperlipidemia   . Hypertension   . AF (atrial fibrillation)   . Hyperthyroidism     remotely in 1941  . Normocytic anemia     2002  . S/P TAH-BSO (total abdominal hysterectomy and bilateral salpingo-oophorectomy)   . Allergy   . Arrhythmia   . Anxiety     Current Outpatient  Prescriptions  Medication Sig Dispense Refill  . ALPRAZolam (XANAX) 0.25 MG tablet Take 0.25 mg by mouth 2 (two) times daily as needed. For anxiety      . aspirin 81 MG tablet Take 81 mg by mouth daily.        Marland Kitchen ENSURE (ENSURE) Take 237 mLs by mouth 2 (two) times daily between meals.        Marland Kitchen ezetimibe (ZETIA) 10 MG tablet Take 1 tablet (10 mg total) by mouth daily.  90 tablet  0  . isosorbide mononitrate (IMDUR) 30 MG 24 hr tablet Take 1 tablet (30 mg total) by mouth daily.  30 tablet  2  . metoprolol tartrate (LOPRESSOR) 25 MG tablet Take 12.5 mg by mouth daily.      . cefUROXime (CEFTIN) 250 MG tablet Take 1 tablet (250 mg total) by mouth 2 (two) times daily.  14 tablet  0   No current facility-administered medications for this visit.    Allergies:    Allergies  Allergen Reactions  . Statins     Myalgia     Social History:  The patient  reports that she quit smoking about 49 years ago. She has never used smokeless tobacco. She reports that she does not drink alcohol or use illicit drugs.   ROS:  Please see the history of  present illness.   No dysuria.  All other systems reviewed and negative.   PHYSICAL EXAM: VS:  BP 172/68  Pulse 54  Ht 5\' 6"  (1.676 m)  Wt 93 lb 6.4 oz (42.366 kg)  BMI 15.08 kg/m2 Well nourished, well developed, in no acute distress HEENT: normal Neck: no JVD at 90 Cardiac:  normal S1, S2; RRR; no murmur Lungs:  clear to auscultation bilaterally, no wheezing, rhonchi or rales Abd: soft, nontender, no hepatomegaly Ext: no edema Skin: warm and dry Neuro:  CNs 2-12 intact, no focal abnormalities noted  EKG:  Sinus bradycardia, HR 54, left axis deviation, anteroseptal Q waves, IVCD, ST depression noted inferolaterally, no change when compared to prior tracing in 2011     ASSESSMENT AND PLAN:  1. Bradycardia:  She is asymptomatic. Agree with eliminating beta blocker therapy given her evidence of conduction system disease and advanced age. I have given her  instructions to stop her metoprolol completely. We will then set her up for a 24-hour Holter. As long as she has no bradycardia arrhythmias noted, she can follow up with cardiology as needed. I reviewed this with Dr. Eden Emms today who agreed. 2. Hypertension:  She has difficulty with high blood pressures in clinic. Blood pressures at home are stable. Goal is less than 150/90. She is not taking isosorbide. She does not have to try to take this. She will keep an eye on her blood pressures and bring those with her to her next visit with Melissa. 3. Abnormal ECG:  Her ECG is unchanged from 2011. She has not had any symptoms suggestive of angina. No further workup is indicated at this time. 4. Urinary tract infection:  She has not been able to take Ceftin. She is not having any dysuria. I asked her to remain off of the antibiotic until she is seen back in follow up. 5. Disposition:  Follow up with Dr. Eden Emms as needed.  Signed, Tereso Newcomer, PA-C  4:31 PM 08/13/2012

## 2012-08-13 NOTE — Addendum Note (Signed)
Addended by: Tarri Fuller on: 08/13/2012 05:31 PM   Modules accepted: Orders

## 2012-08-13 NOTE — Patient Instructions (Addendum)
Take the metoprolol tartrate 25 mg 1/2 tablet every other day for one week, then stop it. Arrange a 24 hour Holter monitor 2 weeks after stopping Metoprolol.  Hold off on trying to take the Ceftin until you see Melissa back.  Since you have not been taking the Isosorbide (Imdur), do not take it.  Check Blood Pressure once daily and bring list to your next appointment with O'SULLIVAN,MELISSA S., NP.   FOLLOW UP AS NEEDED

## 2012-08-27 ENCOUNTER — Telehealth: Payer: Self-pay | Admitting: Physician Assistant

## 2012-08-27 NOTE — Telephone Encounter (Signed)
s/w pt's husband who states since metoprolol has been decreased pt's BP has been increasing; readings from today and end of feb;  08-27-12 171/106 hr 72... 2/27 161/92 hr 71.... 2/26 173/106 hr 72...  2/22 173/92 hr 71. I d/w Scott W. PA and cb pt back with recommendation from PA. Husband states he feels pt should go back on metoprolol. 

## 2012-08-27 NOTE — Telephone Encounter (Signed)
s/w pt's husband who states since metoprolol has been decreased pt's BP has been increasing; readings from today and end of feb;  08-27-12 171/106 hr 72... 2/27 161/92 hr 71.... 2/26 173/106 hr 72...  2/22 173/92 hr 71. I d/w Bing Neighbors. PA and cb pt back with recommendation from PA. Husband states he feels pt should go back on metoprolol.

## 2012-08-27 NOTE — Telephone Encounter (Signed)
Pt on metoprolol, today last day and husband was to monitor BP and when she went from every day to every other day, it started increasing, today went to 171/106 pulse 72, pls call pt husband [phone]

## 2012-08-28 MED ORDER — AMLODIPINE BESYLATE 2.5 MG PO TABS: 2.5000 mg | ORAL_TABLET | Freq: Every day | ORAL | Status: DC

## 2012-08-28 NOTE — Telephone Encounter (Signed)
S/w pt's husband who is aware that Tereso Newcomer, [facility] is going to start pt on norvasc 2.5 mg daily, rx [facility] [address], reminded husband about appt 3/18 for monitor, husband verbalized understanding to all instrucutions tonight.

## 2012-08-28 NOTE — Telephone Encounter (Signed)
Follow-up: ° ° ° °Patient called in returning your call.  Please call back. °

## 2012-08-28 NOTE — Telephone Encounter (Signed)
rtnd pt's call Bing Neighbors. PAC has not yet looked at the BP readings but he will today and I w/cb alter this afternoon. pt said ok and thank you. pt said ok to lmom

## 2012-08-28 NOTE — Addendum Note (Signed)
Addended by: Tarri Fuller on: 08/28/2012 05:40 PM   Modules accepted: Orders

## 2012-08-28 NOTE — Telephone Encounter (Signed)
rtnd pt's call Scott W. PAC has not yet looked at the BP readings but he will today and I w/cb alter this afternoon. pt said ok and thank you. pt said ok to lmom 

## 2012-08-28 NOTE — Telephone Encounter (Signed)
, °

## 2012-09-02 ENCOUNTER — Ambulatory Visit (INDEPENDENT_AMBULATORY_CARE_PROVIDER_SITE_OTHER): Payer: Medicare Other | Admitting: Family

## 2012-09-02 ENCOUNTER — Ambulatory Visit (HOSPITAL_BASED_OUTPATIENT_CLINIC_OR_DEPARTMENT_OTHER)
Admission: RE | Admit: 2012-09-02 | Discharge: 2012-09-02 | Disposition: A | Discharge status: Home / Self Care | Payer: Medicare Other | Source: Ambulatory Visit | Attending: Family | Admitting: Family

## 2012-09-02 ENCOUNTER — Telehealth: Payer: Self-pay | Admitting: Family

## 2012-09-02 ENCOUNTER — Encounter: Payer: Self-pay | Admitting: Family

## 2012-09-02 VITALS — BP 120/84 | HR 85 | Temp 97.4°F | Resp 16 | Ht 65.98 in | Wt 90.0 lb

## 2012-09-02 DIAGNOSIS — R059 Cough, unspecified: Secondary | ICD-10-CM | POA: Insufficient documentation

## 2012-09-02 DIAGNOSIS — R5381 Other malaise: Secondary | ICD-10-CM

## 2012-09-02 DIAGNOSIS — J438 Other emphysema: Secondary | ICD-10-CM | POA: Insufficient documentation

## 2012-09-02 LAB — POCT URINALYSIS DIPSTICK
Bilirubin, UA: NEGATIVE
Glucose, UA: NEGATIVE
Spec Grav, UA: >=1.03
pH, UA: 5

## 2012-09-02 LAB — CBC WITH DIFFERENTIAL/PLATELET
Basophils Relative: 0 % (ref 0–1)
Eosinophils Absolute: 0 10*3/uL (ref 0.0–0.7)
Eosinophils Relative: 0 % (ref 0–5)
HCT: 39.7 % (ref 36.0–46.0)
Hemoglobin: 13.2 g/dL (ref 12.0–15.0)
MCH: 31.1 pg (ref 26.0–34.0)
MCHC: 33.2 g/dL (ref 30.0–36.0)
MCV: 93.4 fL (ref 78.0–100.0)
Monocytes Absolute: 0.4 10*3/uL (ref 0.1–1.0)
Monocytes Relative: 7 % (ref 3–12)
Neutro Abs: 3.7 10*3/uL (ref 1.7–7.7)

## 2012-09-02 LAB — BASIC METABOLIC PANEL
CO2: 26 mEq/L (ref 19–32)
Chloride: 102 mEq/L (ref 96–112)
Glucose, Bld: 208 mg/dL — ABNORMAL HIGH (ref 70–99)
Potassium: 4.3 mEq/L (ref 3.5–5.3)
Sodium: 137 mEq/L (ref 135–145)

## 2012-09-02 MED ORDER — CIPROFLOXACIN 500 MG/5ML (10%) PO SUSR: 250.0000 mg | Freq: Two times a day (BID) | ORAL | Status: DC

## 2012-09-02 NOTE — Patient Instructions (Addendum)
Please complete your chest x ray on the first floor. We will contact you with your results and further recommendations.  Follow up in 1 week.

## 2012-09-02 NOTE — Progress Notes (Signed)
Subjective:    Patient ID: Leah Harper, female    DOB: [birth year], 79 y.o.   MRN: [id number]  HPI  [name] is a 79 yr old female who presents today for follow up.  1) HTN- Husband reports that she is   2) Bradycardia-  Last visit she was noted to be bradycardic. Her metoprolol was decreased at this visit.  Cardiology ultimately discontinued the metoprolol and added amlodipine.    3) She was treated empirically for UTI last visit but was unable to complete due to difficulty swallowing pills.  Husband notes that pt has been very weak.  Denies burning, frequency, fever.  Husband reports poor appetite.  She reports good mood.  She has been coughing for 10 days.  Cough is productive.    4) Memory loss- she is now on exelon patch.  Husband thinks that this may be helping some.     Review of Systems See HPI  Past Medical History  Diagnosis Date  . Hyperlipidemia   . Hypertension   . AF (atrial fibrillation)   . Hyperthyroidism     remotely in 1941  . Normocytic anemia     2002  . S/P TAH-BSO (total abdominal hysterectomy and bilateral salpingo-oophorectomy)   . Allergy   . Arrhythmia   . Anxiety     History   Social History  . Marital Status: Married    Spouse Name: [name]    Number of Children: 3  . Years of Education: N/A   Occupational History  . retired    Social History Main Topics  . Smoking status: Former Smoker    Quit date: 07/17/1963  . Smokeless tobacco: Never Used  . Alcohol Use: No  . Drug Use: No  . Sexually Active: Not on file   Other Topics Concern  . Not on file   Social History Narrative   Retired   Married -3 children    Tobacco abuse-no   Alcohol use-no     Regular exercise-no    Past Surgical History  Procedure Laterality Date  . Tonsillectomy    . Total abdominal hysterectomy w/ bilateral salpingoophorectomy  1986  . Bilateral salpingoophorectomy    . Bladder suspension      bladder tack with TAHBSO    Family History   Problem Relation Age of Onset  . Heart disease Father   . Heart disease Brother     heart attack  . Heart disease Brother     Allergies  Allergen Reactions  . Statins     Myalgia     Current Outpatient Prescriptions on File Prior to Visit  Medication Sig Dispense Refill  . ALPRAZolam (XANAX) 0.25 MG tablet Take 0.25 mg by mouth 2 (two) times daily as needed. For anxiety      . amLODipine (NORVASC) 2.5 MG tablet Take 1 tablet (2.5 mg total) by mouth daily.  30 tablet  11  . aspirin 81 MG tablet Take 81 mg by mouth daily.        Marland Kitchen ENSURE (ENSURE) Take 237 mLs by mouth 2 (two) times daily between meals.        Marland Kitchen ezetimibe (ZETIA) 10 MG tablet Take 1 tablet (10 mg total) by mouth daily.  90 tablet  0  . metoprolol tartrate (LOPRESSOR) 25 MG tablet 12.5 MG EVERY OTHER DAY FOR 1 WEEK THEN STOP       No current facility-administered medications on file prior to visit.    BP 120/84  Pulse 85  Temp(Src) 97.4 F (36.3 C) (Oral)  Resp 16  Ht 5' 5.98" (1.676 m)  Wt 90 lb (40.824 kg)  BMI 14.53 kg/m2  SpO2 97%       Objective:   Physical Exam  Constitutional: She appears well-developed and well-nourished. No distress.  HENT:  Head: Normocephalic and atraumatic.  Cardiovascular: Normal rate and regular rhythm.   No murmur heard. Pulmonary/Chest: Breath sounds normal. No respiratory distress. She has no wheezes. She has no rales. She exhibits no tenderness.  Musculoskeletal: She exhibits no edema.  Lymphadenopathy:    She has no cervical adenopathy.  Skin: Skin is warm.  Psychiatric:  Flat affect          Assessment & Plan:

## 2012-09-02 NOTE — Assessment & Plan Note (Signed)
Husband brings today her home BP readings since discontinuation of the metoprolol.  BP readings are considerably higher at home.  I told husband that I agree with the amlodipine 2.5mg  that was prescribed by cardiology.

## 2012-09-02 NOTE — Assessment & Plan Note (Signed)
Likely multifactorial.  Given cough, need to rule out pneumonia. Will obtain CXR today.  Urine notes trace leuks, send for culture. Will choose antibiotic after CXR is reviewed.

## 2012-09-02 NOTE — Telephone Encounter (Signed)
Reviewed CXR, no pneumonia. Will rx with cipro for probable UTI and bronchitis.  Spoke with pt. She was unable to understand  My message and said husband was unavailable and he would have to call me back.

## 2012-09-03 LAB — URINE CULTURE: Organism ID, Bacteria: NO GROWTH

## 2012-09-03 NOTE — Telephone Encounter (Signed)
Late entry. Spoke with husband last night.  Plan, results, abx reviewed with him.

## 2012-09-04 ENCOUNTER — Telehealth: Payer: Self-pay | Admitting: Family

## 2012-09-04 NOTE — Telephone Encounter (Signed)
Urine culture is negative. Spoke to husband, advised him that pt can stop abx.  Follow up Monday as scheduled.

## 2012-09-09 ENCOUNTER — Ambulatory Visit (HOSPITAL_BASED_OUTPATIENT_CLINIC_OR_DEPARTMENT_OTHER)
Admission: RE | Admit: 2012-09-09 | Discharge: 2012-09-09 | Disposition: A | Discharge status: Home / Self Care | Payer: Medicare Other | Source: Ambulatory Visit | Attending: Family | Admitting: Family

## 2012-09-09 ENCOUNTER — Ambulatory Visit (INDEPENDENT_AMBULATORY_CARE_PROVIDER_SITE_OTHER): Payer: Medicare Other | Admitting: Family

## 2012-09-09 ENCOUNTER — Ambulatory Visit: Payer: Medicare Other | Admitting: Family

## 2012-09-09 ENCOUNTER — Encounter: Payer: Self-pay | Admitting: Family

## 2012-09-09 VITALS — BP 116/80 | HR 66 | Temp 97.1°F | Resp 16 | Ht 65.0 in | Wt 91.0 lb

## 2012-09-09 DIAGNOSIS — R102 Pelvic and perineal pain: Secondary | ICD-10-CM | POA: Insufficient documentation

## 2012-09-09 DIAGNOSIS — M25559 Pain in unspecified hip: Secondary | ICD-10-CM | POA: Insufficient documentation

## 2012-09-09 DIAGNOSIS — J322 Chronic ethmoidal sinusitis: Secondary | ICD-10-CM | POA: Insufficient documentation

## 2012-09-09 MED ORDER — AMOXICILLIN-POT CLAVULANATE 250-62.5 MG/5ML PO SUSR: 10.0000 mL | Freq: Two times a day (BID) | ORAL | Status: DC

## 2012-09-09 NOTE — Assessment & Plan Note (Signed)
Will obtain x ray of pelvis and hip to exclude fracture.

## 2012-09-09 NOTE — Progress Notes (Signed)
Subjective:    Patient ID: Leah Harper, female    DOB: 1920-05-12, 79 y.o.   MRN: [id number]  HPI  Leah Harper is a 79 yr old female who presents today for follow up.  1) Weakness- last visit she complained of weakness. Her urine culture was negative.  CXR neg for acute findings. Cold symptoms seem to be improving. She reports that she is blowing nasal drainage is ?white in color. Denies fever.  Weakness is about the same. She had 5 days of cipro.    2) HTN- amlodipine was added by cardiology.   3) CRI- she has followed with [facility] Agricultural consultant)  Had a fall after last visit.  She stepped on some wet leaves and landed on her sacrum.  Hurts at the end of the spine. Hurts to sit.  She is ambulating at her baseline.  She is scheduled for holter monitor.   Review of Systems    see HPI  Past Medical History  Diagnosis Date  . Hyperlipidemia   . Hypertension   . AF (atrial fibrillation)   . Hyperthyroidism     remotely in 1941  . Normocytic anemia     2002  . S/P TAH-BSO (total abdominal hysterectomy and bilateral salpingo-oophorectomy)   . Allergy   . Arrhythmia   . Anxiety     History   Social History  . Marital Status: Married    Spouse Name: Leah Harper    Number of Children: 3  . Years of Education: N/A   Occupational History  . retired    Social History Main Topics  . Smoking status: Former Smoker    Quit date: 07/17/1963  . Smokeless tobacco: Never Used  . Alcohol Use: No  . Drug Use: No  . Sexually Active: Not on file   Other Topics Concern  . Not on file   Social History Narrative   Retired   Married -3 children    Tobacco abuse-no   Alcohol use-no     Regular exercise-no    Past Surgical History  Procedure Laterality Date  . Tonsillectomy    . Total abdominal hysterectomy w/ bilateral salpingoophorectomy  1986  . Bilateral salpingoophorectomy    . Bladder suspension      bladder tack with TAHBSO    Family History  Problem  Relation Age of Onset  . Heart disease Father   . Heart disease Brother     heart attack  . Heart disease Brother     Allergies  Allergen Reactions  . Statins     Myalgia     Current Outpatient Prescriptions on File Prior to Visit  Medication Sig Dispense Refill  . ALPRAZolam (XANAX) 0.25 MG tablet Take 0.25 mg by mouth 2 (two) times daily as needed. For anxiety      . amLODipine (NORVASC) 2.5 MG tablet Take 1 tablet (2.5 mg total) by mouth daily.  30 tablet  11  . aspirin 81 MG tablet Take 81 mg by mouth daily.        . ciprofloxacin (CIPRO) 500 MG/5ML (10%) suspension Take 2.5 mLs (250 mg total) by mouth 2 (two) times daily.  100 mL  0  . ENSURE (ENSURE) Take 237 mLs by mouth 2 (two) times daily between meals.        Marland Kitchen ezetimibe (ZETIA) 10 MG tablet Take 1 tablet (10 mg total) by mouth daily.  90 tablet  0  . rivastigmine (EXELON) 4.6 mg/24hr Place 1 patch onto the  skin at bedtime.       No current facility-administered medications on file prior to visit.    BP 116/80  Pulse 66  Temp(Src) 97.1 F (36.2 C) (Oral)  Resp 16  Ht 5\' 5"  (1.651 m)  Wt 91 lb (41.277 kg)  BMI 15.14 kg/m2  SpO2 96%    Objective:   Physical Exam  Constitutional: She appears well-developed. No distress.  Frail, thin appearing elderly white female.   HENT:  Head: Normocephalic and atraumatic.  Right Ear: Tympanic membrane and ear canal normal.  Left Ear: Tympanic membrane and ear canal normal.  Mouth/Throat: No oropharyngeal exudate, posterior oropharyngeal edema or posterior oropharyngeal erythema.  Cardiovascular: Normal rate and regular rhythm.   No murmur heard. Pulmonary/Chest: Effort normal and breath sounds normal. No respiratory distress. She has no wheezes. She has no rales. She exhibits no tenderness.  Musculoskeletal: She exhibits no edema.  Neurological: She is alert.  Psychiatric: She has a normal mood and affect. Her behavior is normal. Judgment and thought content normal.           Assessment & Plan:

## 2012-09-09 NOTE — Patient Instructions (Addendum)
Please call at the end of the week to let me know how you are feeling.

## 2012-09-09 NOTE — Assessment & Plan Note (Signed)
She continues to follow with Dr. Briant Cedar.  Cr at baseline at 1.76.

## 2012-09-09 NOTE — Assessment & Plan Note (Signed)
Symptoms most consistent with sinusitis. Will rx with Augmentin.

## 2012-09-09 NOTE — Assessment & Plan Note (Signed)
BP and HR look good on amlodipine 2.5.  Continue same.

## 2012-09-12 ENCOUNTER — Emergency Department (HOSPITAL_BASED_OUTPATIENT_CLINIC_OR_DEPARTMENT_OTHER)
Admission: EM | Admit: 2012-09-12 | Discharge: 2012-09-12 | Disposition: A | Discharge status: Home / Self Care | Payer: Medicare Other | Attending: Emergency Medicine | Admitting: Emergency Medicine

## 2012-09-12 ENCOUNTER — Telehealth: Payer: Self-pay | Admitting: *Deleted

## 2012-09-12 ENCOUNTER — Emergency Department (HOSPITAL_BASED_OUTPATIENT_CLINIC_OR_DEPARTMENT_OTHER): Payer: Medicare Other

## 2012-09-12 ENCOUNTER — Encounter (HOSPITAL_BASED_OUTPATIENT_CLINIC_OR_DEPARTMENT_OTHER): Payer: Self-pay

## 2012-09-12 DIAGNOSIS — Z862 Personal history of diseases of the blood and blood-forming organs and certain disorders involving the immune mechanism: Secondary | ICD-10-CM | POA: Insufficient documentation

## 2012-09-12 DIAGNOSIS — Z90710 Acquired absence of both cervix and uterus: Secondary | ICD-10-CM | POA: Insufficient documentation

## 2012-09-12 DIAGNOSIS — H5316 Psychophysical visual disturbances: Secondary | ICD-10-CM | POA: Insufficient documentation

## 2012-09-12 DIAGNOSIS — Z9079 Acquired absence of other genital organ(s): Secondary | ICD-10-CM | POA: Insufficient documentation

## 2012-09-12 DIAGNOSIS — Z7982 Long term (current) use of aspirin: Secondary | ICD-10-CM | POA: Insufficient documentation

## 2012-09-12 DIAGNOSIS — I10 Essential (primary) hypertension: Secondary | ICD-10-CM | POA: Insufficient documentation

## 2012-09-12 DIAGNOSIS — Z8639 Personal history of other endocrine, nutritional and metabolic disease: Secondary | ICD-10-CM | POA: Insufficient documentation

## 2012-09-12 DIAGNOSIS — F0390 Unspecified dementia without behavioral disturbance: Secondary | ICD-10-CM | POA: Insufficient documentation

## 2012-09-12 DIAGNOSIS — E860 Dehydration: Secondary | ICD-10-CM | POA: Insufficient documentation

## 2012-09-12 DIAGNOSIS — Z79899 Other long term (current) drug therapy: Secondary | ICD-10-CM | POA: Insufficient documentation

## 2012-09-12 DIAGNOSIS — Z8679 Personal history of other diseases of the circulatory system: Secondary | ICD-10-CM | POA: Insufficient documentation

## 2012-09-12 DIAGNOSIS — R4182 Altered mental status, unspecified: Secondary | ICD-10-CM | POA: Insufficient documentation

## 2012-09-12 DIAGNOSIS — Z87891 Personal history of nicotine dependence: Secondary | ICD-10-CM | POA: Insufficient documentation

## 2012-09-12 DIAGNOSIS — F411 Generalized anxiety disorder: Secondary | ICD-10-CM | POA: Insufficient documentation

## 2012-09-12 DIAGNOSIS — E785 Hyperlipidemia, unspecified: Secondary | ICD-10-CM | POA: Insufficient documentation

## 2012-09-12 LAB — CBC WITH DIFFERENTIAL/PLATELET
Eosinophils Relative: 1 % (ref 0–5)
HCT: 39.9 % (ref 36.0–46.0)
Hemoglobin: 13.7 g/dL (ref 12.0–15.0)
Lymphocytes Relative: 23 % (ref 12–46)
Lymphs Abs: 1.4 10*3/uL (ref 0.7–4.0)
MCV: 93 fL (ref 78.0–100.0)
Monocytes Absolute: 0.5 10*3/uL (ref 0.1–1.0)
Monocytes Relative: 8 % (ref 3–12)
RBC: 4.29 MIL/uL (ref 3.87–5.11)
RDW: 12.9 % (ref 11.5–15.5)
WBC: 6.1 10*3/uL (ref 4.0–10.5)

## 2012-09-12 LAB — COMPREHENSIVE METABOLIC PANEL
BUN: 29 mg/dL — ABNORMAL HIGH (ref 6–23)
CO2: 27 mEq/L (ref 19–32)
Calcium: 10 mg/dL (ref 8.4–10.5)
Chloride: 97 mEq/L (ref 96–112)
Creatinine, Ser: 1.5 mg/dL — ABNORMAL HIGH (ref 0.50–1.10)
GFR calc Af Amer: 34 mL/min — ABNORMAL LOW (ref >90)
GFR calc non Af Amer: 29 mL/min — ABNORMAL LOW (ref >90)
Glucose, Bld: 104 mg/dL — ABNORMAL HIGH (ref 70–99)
Total Bilirubin: 0.5 mg/dL (ref 0.3–1.2)

## 2012-09-12 LAB — URINE MICROSCOPIC-ADD ON

## 2012-09-12 LAB — URINALYSIS, ROUTINE W REFLEX MICROSCOPIC
Ketones, ur: 15 mg/dL — AB
Leukocytes, UA: NEGATIVE
Nitrite: NEGATIVE
Protein, ur: 100 mg/dL — AB

## 2012-09-12 LAB — PROCALCITONIN: Procalcitonin: <0.1 ng/mL

## 2012-09-12 MED ORDER — SODIUM CHLORIDE 0.9 % IV SOLN
1000.0000 mL | Freq: Once | INTRAVENOUS | Status: AC
  Administered 2012-09-12: 1000 mL via INTRAVENOUS

## 2012-09-12 MED ORDER — SODIUM CHLORIDE 0.9 % IV SOLN
1000.0000 mL | INTRAVENOUS | Status: DC
  Administered 2012-09-12: 1000 mL via INTRAVENOUS

## 2012-09-12 NOTE — ED Notes (Signed)
Pt. Husband states the Pt. Fell 10 days ago and was seen by her PMD.  Pt. Has not been taking her depression meds in 3 days due to increase of med.  Pt. husband Reports the meds made her sick he believes.  Pt. Husband reports a loss of a sister has been wearing on the Pt. For approx. 6 yrs.  Pt. Husband reports severe dementia.

## 2012-09-12 NOTE — Telephone Encounter (Signed)
Received call from pt's husband stating alprazolam rx expired 02/2012 and he is requesting refill. Also wants to know if pt can take tylenol for her back pain. Proceeded to state that pt has deteriorated quickly; not eating can't get her out of bed. Reports that pt had a bad night last night with anxiety and crying. Pt's husband states that he is going to take pt to ER for evaluation. Please advise re: tylenol and alprazolam.

## 2012-09-12 NOTE — ED Notes (Signed)
Pt. Is in no distress with husband at bedside.  Pt. IV site WNL.  Pt. Has no vomiting or diarrhea noted.

## 2012-09-12 NOTE — ED Notes (Signed)
Patient is resting comfortably. 

## 2012-09-12 NOTE — ED Provider Notes (Signed)
History     CSN: [id number]  Arrival date & time 09/12/12  1600   First MD Initiated Contact with Patient 09/12/12 1626      Chief Complaint  Patient presents with  . Dehydration  . Altered Mental Status    (Consider location/radiation/quality/duration/timing/severity/associated sxs/prior treatment) HPI The patient presents with her husband by the history of present illness.  The patient is demented.  He states over the past 2 days the patient has had increasing anorexia, decreased interactivity, decreased ambulation.  She also has new visual hallucinations.  No new vomiting, fall, confusion beyond her baseline. No fever, no diarrhea. The patient has baseline incontinence. The patient takes Exelon, which is a cholinergic medication, and recently had her dose doubled. The patient also recently had a mechanical fall which was evaluated with x-ray of her pelvis, interpreted as unremarkable.  Past Medical History  Diagnosis Date  . Hyperlipidemia   . Hypertension   . AF (atrial fibrillation)   . Hyperthyroidism     remotely in 1941  . Normocytic anemia     2002  . S/P TAH-BSO (total abdominal hysterectomy and bilateral salpingo-oophorectomy)   . Allergy   . Arrhythmia   . Anxiety     Past Surgical History  Procedure Laterality Date  . Tonsillectomy    . Total abdominal hysterectomy w/ bilateral salpingoophorectomy  1986  . Bilateral salpingoophorectomy    . Bladder suspension      bladder tack with TAHBSO    Family History  Problem Relation Age of Onset  . Heart disease Father   . Heart disease Brother     heart attack  . Heart disease Brother     History  Substance Use Topics  . Smoking status: Former Smoker    Quit date: 07/17/1963  . Smokeless tobacco: Never Used  . Alcohol Use: No    OB History   Grav Para Term Preterm Abortions TAB SAB Ect Mult Living                  Review of Systems  Unable to perform ROS: Dementia    Allergies   Statins  Home Medications   Current Outpatient Rx  Name  Route  Sig  Dispense  Refill  . ALPRAZolam (XANAX) 0.25 MG tablet   Oral   Take 0.25 mg by mouth 2 (two) times daily as needed. For anxiety         . amLODipine (NORVASC) 2.5 MG tablet   Oral   Take 1 tablet (2.5 mg total) by mouth daily.   30 tablet   11   . amoxicillin-clavulanate (AUGMENTIN) 250-62.5 MG/5ML suspension   Oral   Take 10 mLs by mouth 2 (two) times daily.   200 mL   0   . aspirin 81 MG tablet   Oral   Take 81 mg by mouth daily.           Marland Kitchen ENSURE (ENSURE)   Oral   Take 237 mLs by mouth 2 (two) times daily between meals.           Marland Kitchen ezetimibe (ZETIA) 10 MG tablet   Oral   Take 1 tablet (10 mg total) by mouth daily.   90 tablet   0   . rivastigmine (EXELON) 4.6 mg/24hr   Transdermal   Place 1 patch onto the skin at bedtime.           BP 158/75  Pulse 85  Temp(Src) 97.8 F (36.6 C) (  Oral)  Resp 16  Wt 88 lb 14.4 oz (40.325 kg)  BMI 14.79 kg/m2  SpO2 100%  Physical Exam  Nursing note and vitals reviewed. Constitutional: She appears well-developed and well-nourished. No distress.  HENT:  Head: Normocephalic and atraumatic.  Eyes: Conjunctivae and EOM are normal.  Cardiovascular: Normal rate and regular rhythm.   Pulmonary/Chest: Effort normal and breath sounds normal. No stridor. No respiratory distress.  Abdominal: She exhibits no distension.  Musculoskeletal: She exhibits no edema.  Neurological: She is alert. No cranial nerve deficit or sensory deficit.  Patient moves all extremity spontaneously, though minimally.  There is no focal asymmetry, no focal weakness.  She does not follow all commands appropriately. She is oriented to self, and roughly to place  Skin: Skin is warm and dry.  Psychiatric: She has a normal mood and affect. Her speech is delayed. She is slowed and withdrawn. Cognition and memory are impaired.    ED Course  Procedures (including critical care  time)  Labs Reviewed  CULTURE, BLOOD (ROUTINE X 2)  CULTURE, BLOOD (ROUTINE X 2)  URINE CULTURE  CBC WITH DIFFERENTIAL  COMPREHENSIVE METABOLIC PANEL  LACTIC ACID, PLASMA  PROCALCITONIN  URINALYSIS, ROUTINE W REFLEX MICROSCOPIC   No results found.   No diagnosis found.  8:43 PM Patient and her husband informed of all results.  She remains in no distress.  No complaints throughout the ED stay.  O2- 99%ra, normal  Cardiac: 65 sr, normal  MDM  This pleasant elderly female presents with ongoing generalized complaints.  The patient is awake and alert, though it is oriented.  This seems to be baseline on chart review.  The patient has no focal asymmetry, is afebrile, with no appreciable cough.  Vital signs are stable.  The patient has multiple chronic conditions, including dementia for which she takes a cholinergic medication which was recently changed.  This may be causing her change in interactivity, as the remainder of her labs are largely reassuring.  Absent decompensation here, acute pain, fever, there is low suspicion for occult infection or ongoing coronary ischemia, cerebrovascular ischemia.  The patient has followup both with her nurse practitioner in her cardiologist in the next few days.  She was discharged in a stable condition, though acknowledging that her decreased interactivity is a work in progress.        Gerhard Munch, MD 09/12/12 2045

## 2012-09-12 NOTE — ED Notes (Signed)
Family at bedside. 

## 2012-09-12 NOTE — ED Notes (Signed)
Spouse states pt fell 10 days ago and has had a decreased appetite, had a change in mentation and hallucinating.  He also reports a change in dosage of Exelon.

## 2012-09-12 NOTE — Telephone Encounter (Signed)
Ok to give refill on alprazolam. Ok to take tylenol for back pain.agree with ED.

## 2012-09-13 MED ORDER — ALPRAZOLAM 0.25 MG PO TABS: 0.2500 mg | ORAL_TABLET | Freq: Two times a day (BID) | ORAL | Status: DC | PRN

## 2012-09-13 NOTE — Telephone Encounter (Signed)
Rx left on [facility] voicemail, notified pt's husband.

## 2012-09-14 LAB — URINE CULTURE: Colony Count: NO GROWTH

## 2012-09-18 LAB — CULTURE, BLOOD (ROUTINE X 2): Culture: NO GROWTH

## 2012-09-19 ENCOUNTER — Emergency Department (HOSPITAL_BASED_OUTPATIENT_CLINIC_OR_DEPARTMENT_OTHER)
Admission: EM | Admit: 2012-09-19 | Discharge: 2012-09-19 | Disposition: A | Discharge status: Home / Self Care | Payer: Medicare Other | Attending: Emergency Medicine | Admitting: Emergency Medicine

## 2012-09-19 ENCOUNTER — Encounter (HOSPITAL_BASED_OUTPATIENT_CLINIC_OR_DEPARTMENT_OTHER): Payer: Self-pay | Admitting: *Deleted

## 2012-09-19 ENCOUNTER — Emergency Department (HOSPITAL_BASED_OUTPATIENT_CLINIC_OR_DEPARTMENT_OTHER): Payer: Medicare Other

## 2012-09-19 ENCOUNTER — Ambulatory Visit (INDEPENDENT_AMBULATORY_CARE_PROVIDER_SITE_OTHER): Payer: Medicare Other | Admitting: Family

## 2012-09-19 VITALS — BP 100/58 | HR 120 | Temp 98.2°F | Resp 24 | Wt 88.0 lb

## 2012-09-19 DIAGNOSIS — E785 Hyperlipidemia, unspecified: Secondary | ICD-10-CM | POA: Insufficient documentation

## 2012-09-19 DIAGNOSIS — Z87828 Personal history of other (healed) physical injury and trauma: Secondary | ICD-10-CM | POA: Insufficient documentation

## 2012-09-19 DIAGNOSIS — M545 Low back pain, unspecified: Secondary | ICD-10-CM | POA: Insufficient documentation

## 2012-09-19 DIAGNOSIS — Z8639 Personal history of other endocrine, nutritional and metabolic disease: Secondary | ICD-10-CM | POA: Insufficient documentation

## 2012-09-19 DIAGNOSIS — M1990 Unspecified osteoarthritis, unspecified site: Secondary | ICD-10-CM | POA: Insufficient documentation

## 2012-09-19 DIAGNOSIS — F411 Generalized anxiety disorder: Secondary | ICD-10-CM | POA: Insufficient documentation

## 2012-09-19 DIAGNOSIS — Z9181 History of falling: Secondary | ICD-10-CM | POA: Insufficient documentation

## 2012-09-19 DIAGNOSIS — Z87891 Personal history of nicotine dependence: Secondary | ICD-10-CM | POA: Insufficient documentation

## 2012-09-19 DIAGNOSIS — R630 Anorexia: Secondary | ICD-10-CM | POA: Insufficient documentation

## 2012-09-19 DIAGNOSIS — E860 Dehydration: Secondary | ICD-10-CM | POA: Insufficient documentation

## 2012-09-19 DIAGNOSIS — Z862 Personal history of diseases of the blood and blood-forming organs and certain disorders involving the immune mechanism: Secondary | ICD-10-CM | POA: Insufficient documentation

## 2012-09-19 DIAGNOSIS — Z8679 Personal history of other diseases of the circulatory system: Secondary | ICD-10-CM | POA: Insufficient documentation

## 2012-09-19 DIAGNOSIS — I10 Essential (primary) hypertension: Secondary | ICD-10-CM | POA: Insufficient documentation

## 2012-09-19 DIAGNOSIS — Z7982 Long term (current) use of aspirin: Secondary | ICD-10-CM | POA: Insufficient documentation

## 2012-09-19 DIAGNOSIS — Z79899 Other long term (current) drug therapy: Secondary | ICD-10-CM | POA: Insufficient documentation

## 2012-09-19 DIAGNOSIS — F0390 Unspecified dementia without behavioral disturbance: Secondary | ICD-10-CM | POA: Insufficient documentation

## 2012-09-19 LAB — CBC WITH DIFFERENTIAL/PLATELET
Basophils Absolute: 0 10*3/uL (ref 0.0–0.1)
Basophils Relative: 0 % (ref 0–1)
Eosinophils Absolute: 0.1 10*3/uL (ref 0.0–0.7)
Hemoglobin: 12.8 g/dL (ref 12.0–15.0)
MCH: 31.7 pg (ref 26.0–34.0)
MCHC: 34.4 g/dL (ref 30.0–36.0)
Monocytes Relative: 8 % (ref 3–12)
Neutro Abs: 3.8 10*3/uL (ref 1.7–7.7)
Neutrophils Relative %: 62 % (ref 43–77)
Platelets: 222 10*3/uL (ref 150–400)
RDW: 12.4 % (ref 11.5–15.5)

## 2012-09-19 LAB — URINALYSIS, ROUTINE W REFLEX MICROSCOPIC
Hgb urine dipstick: NEGATIVE
Nitrite: NEGATIVE
Protein, ur: 30 mg/dL — AB
Specific Gravity, Urine: 1.021 (ref 1.005–1.030)
Urobilinogen, UA: 0.2 mg/dL (ref 0.0–1.0)

## 2012-09-19 LAB — TROPONIN I: Troponin I: <0.3 ng/mL (ref <0.30)

## 2012-09-19 LAB — COMPREHENSIVE METABOLIC PANEL
ALT: 10 U/L (ref 0–35)
AST: 25 U/L (ref 0–37)
Alkaline Phosphatase: 130 U/L — ABNORMAL HIGH (ref 39–117)
CO2: 27 mEq/L (ref 19–32)
Calcium: 9.7 mg/dL (ref 8.4–10.5)
Chloride: 96 mEq/L (ref 96–112)
GFR calc Af Amer: 32 mL/min — ABNORMAL LOW (ref >90)
GFR calc non Af Amer: 27 mL/min — ABNORMAL LOW (ref >90)
Glucose, Bld: 106 mg/dL — ABNORMAL HIGH (ref 70–99)
Sodium: 133 mEq/L — ABNORMAL LOW (ref 135–145)
Total Bilirubin: 0.4 mg/dL (ref 0.3–1.2)

## 2012-09-19 LAB — URINE MICROSCOPIC-ADD ON

## 2012-09-19 MED ORDER — SODIUM CHLORIDE 0.9 % IV BOLUS (SEPSIS)
1000.0000 mL | Freq: Once | INTRAVENOUS | Status: AC
  Administered 2012-09-19: 1000 mL via INTRAVENOUS

## 2012-09-19 MED ORDER — THIAMINE HCL 100 MG/ML IJ SOLN
100.0000 mg | Freq: Once | INTRAMUSCULAR | Status: AC
  Administered 2012-09-19: 100 mg via INTRAVENOUS
  Filled 2012-09-19: qty 2

## 2012-09-19 NOTE — ED Notes (Signed)
Jacubowitz MD at bedside. 

## 2012-09-19 NOTE — ED Notes (Signed)
Pt's husband reports pt with episode this am where she became less alert during breakfast- reports she has not been eating well recently- also was in ED for dehydration- pt was seen by her PCP today and sent to ED for further evaluation-

## 2012-09-19 NOTE — Assessment & Plan Note (Signed)
79 yr old female with poor PO intake, clinically dehydrated/weak, tachycardic.  She continues to have recurrent falls and at this point really needs placement in a skilled nursing facility.  I have advised the Husband and the son that I think the pt should be evaluated in the ED for further work up, rehydration and hopefully admission and placement in a skilled nursing facility.  They are in agreement with this plan.

## 2012-09-19 NOTE — ED Notes (Signed)
Pt given chicken noodle soup and applesauce- family at bedside to assist pt with meal

## 2012-09-19 NOTE — ED Notes (Signed)
MD at bedside. 

## 2012-09-19 NOTE — ED Notes (Addendum)
Was seen by Dr Brayton Caves before coming here. Weakness and wont eat for several weeks.

## 2012-09-19 NOTE — ED Notes (Signed)
Pt d/c home with family- alert and assisting to dress self at time of d/c- no new rx given- d/c instructions discussed with family by EDP [place]

## 2012-09-19 NOTE — Progress Notes (Signed)
Subjective:    Patient ID: Leah Harper, female    DOB: 25-Oct-1919, 79 y.o.   MRN: [id number]  HPI  Leah Harper is a 79 yr old female who presents today following an unwitnessed fall on Monday. She was evaluated in the ED on Monday 3/20. Records are reviewed.  Lab work and x ray was unremarkable. She apparently improved clinically with hydration and was discharged to home.    The husband reports that the patient had an unwitnessed fall in the home on Monday.  She presents today with chief complaint of weakness.  Weakness is associated with poor PO intake and started around 3/24. Weakness is worsening.  The family has been unable to coax her to eat or drink. Weakness is also associated with reported oliguria.    Review of Systems See HPI  Past Medical History  Diagnosis Date  . Hyperlipidemia   . Hypertension   . AF (atrial fibrillation)   . Hyperthyroidism     remotely in 1941  . Normocytic anemia     2002  . S/P TAH-BSO (total abdominal hysterectomy and bilateral salpingo-oophorectomy)   . Allergy   . Arrhythmia   . Anxiety     History   Social History  . Marital Status: Married    Spouse Name: [name]    Number of Children: 3  . Years of Education: N/A   Occupational History  . retired    Social History Main Topics  . Smoking status: Former Smoker    Quit date: 07/17/1963  . Smokeless tobacco: Never Used  . Alcohol Use: No  . Drug Use: No  . Sexually Active: Not on file   Other Topics Concern  . Not on file   Social History Narrative   Retired   Married -3 children    Tobacco abuse-no   Alcohol use-no     Regular exercise-no    Past Surgical History  Procedure Laterality Date  . Tonsillectomy    . Total abdominal hysterectomy w/ bilateral salpingoophorectomy  1986  . Bilateral salpingoophorectomy    . Bladder suspension      bladder tack with TAHBSO    Family History  Problem Relation Age of Onset  . Heart disease Father   . Heart disease  Brother     heart attack  . Heart disease Brother     Allergies  Allergen Reactions  . Statins     Myalgia     Current Outpatient Prescriptions on File Prior to Visit  Medication Sig Dispense Refill  . ALPRAZolam (XANAX) 0.25 MG tablet Take 1 tablet (0.25 mg total) by mouth 2 (two) times daily as needed. For anxiety  30 tablet  0  . amLODipine (NORVASC) 2.5 MG tablet Take 1 tablet (2.5 mg total) by mouth daily.  30 tablet  11  . amoxicillin-clavulanate (AUGMENTIN) 250-62.5 MG/5ML suspension Take 10 mLs by mouth 2 (two) times daily.  200 mL  0  . aspirin 81 MG tablet Take 81 mg by mouth daily.        Marland Kitchen ENSURE (ENSURE) Take 237 mLs by mouth 2 (two) times daily between meals.        Marland Kitchen ezetimibe (ZETIA) 10 MG tablet Take 1 tablet (10 mg total) by mouth daily.  90 tablet  0   No current facility-administered medications on file prior to visit.    BP 100/58  Pulse 120  Resp 18  Wt 88 lb (39.917 kg)  BMI 14.64 kg/m2  Objective:   Physical Exam  Constitutional: She has a sickly appearance. She appears ill.  Pale cachectic female  Cardiovascular: Tachycardia present.   Pulmonary/Chest: Breath sounds normal. Tachypnea noted.  Abdominal: Soft. Bowel sounds are decreased. There is no tenderness.  Neurological:  Sedated but arousable  Skin: Skin is warm.          Assessment & Plan:  Report give to [employer] in the [facility] ED.  Pt will be escorted downstairs by CMA in wheelchair.

## 2012-09-19 NOTE — ED Provider Notes (Signed)
History     CSN: [id number]  Arrival date & time 09/19/12  1555   First MD Initiated Contact with Patient 09/19/12 1628      Chief Complaint  Patient presents with  . Weakness    (Consider location/radiation/quality/duration/timing/severity/associated sxs/prior treatment) HPI  Level V caveat, dementia. History is obtained from the patient's son and husband Patient has been progressively generally more weak over the past 3 or 4 months. With diminished appetite and weight loss of approximately 40 pounds over the past year. No known fever. She has fallen twice over the past few weeks, the last fall was 3 days ago she tripped and struck her head. She was seen by Ms.O'sullivan at [facility] earlier today and sent here for further evaluation. Patient complains of low back pain. Offers no other complaint she is been any living with assistance of husband for the past several days. No treatment prior to coming here   Past Medical History  Diagnosis Date  . Hyperlipidemia   . Hypertension   . AF (atrial fibrillation)   . Hyperthyroidism     remotely in 1941  . Normocytic anemia     2002  . S/P TAH-BSO (total abdominal hysterectomy and bilateral salpingo-oophorectomy)   . Allergy   . Arrhythmia   . Anxiety     Past Surgical History  Procedure Laterality Date  . Tonsillectomy    . Total abdominal hysterectomy w/ bilateral salpingoophorectomy  1986  . Bilateral salpingoophorectomy    . Bladder suspension      bladder tack with TAHBSO    Family History  Problem Relation Age of Onset  . Heart disease Father   . Heart disease Brother     heart attack  . Heart disease Brother     History  Substance Use Topics  . Smoking status: Former Smoker    Quit date: 07/17/1963  . Smokeless tobacco: Never Used  . Alcohol Use: No    OB History   Grav Para Term Preterm Abortions TAB SAB Ect Mult Living                  Review of Systems  Unable to perform ROS: Dementia   Constitutional: Positive for appetite change and unexpected weight change.  Respiratory:       Recently treated with Augmentin for is chest congestion. Today's last day  Musculoskeletal: Positive for back pain.  Neurological: Positive for weakness.    Allergies  Statins  Home Medications   Current Outpatient Rx  Name  Route  Sig  Dispense  Refill  . ALPRAZolam (XANAX) 0.25 MG tablet   Oral   Take 1 tablet (0.25 mg total) by mouth 2 (two) times daily as needed. For anxiety   30 tablet   0   . amLODipine (NORVASC) 2.5 MG tablet   Oral   Take 1 tablet (2.5 mg total) by mouth daily.   30 tablet   11   . amoxicillin-clavulanate (AUGMENTIN) 250-62.5 MG/5ML suspension   Oral   Take 10 mLs by mouth 2 (two) times daily.   200 mL   0   . aspirin 81 MG tablet   Oral   Take 81 mg by mouth daily.           Marland Kitchen ENSURE (ENSURE)   Oral   Take 237 mLs by mouth 2 (two) times daily between meals.           Marland Kitchen ezetimibe (ZETIA) 10 MG tablet   Oral  Take 1 tablet (10 mg total) by mouth daily.   90 tablet   0     BP 122/62  Pulse 72  Temp(Src) 100 F (37.8 C) (Rectal)  Resp 16  Wt 88 lb (39.917 kg)  BMI 14.64 kg/m2  SpO2 99%  Physical Exam  Nursing note and vitals reviewed. Constitutional: She appears well-nourished. No distress.  Cachectic, chronically ill-appearing  HENT:  Head: Normocephalic and atraumatic.  Mouth/Throat: No oropharyngeal exudate.  Bilateral tympanic membranes normal mucous membranes slightly dry  Eyes: Conjunctivae are normal. Pupils are equal, round, and reactive to light.  Neck: Neck supple. No tracheal deviation present. No thyromegaly present.  Cardiovascular:  No murmur heard. Irregularly irregular  Pulmonary/Chest: Effort normal and breath sounds normal.  Abdominal: Soft. Bowel sounds are normal. She exhibits no distension. There is no tenderness.  Genitourinary:  Normal tone nontender minimal stool on exam finger  Musculoskeletal:  Normal range of motion. She exhibits no edema and no tenderness.  Cervical spine nontender, thoracic spine nontender. Tender over lumbar spine. Pelvis stable nontender. All 4 extremities no contusion abrasion or tenderness neurovascularly intact  Neurological: She is alert. No cranial nerve deficit. Coordination normal.  Follow simple commands moves all extremities  Skin: Skin is warm and dry. No rash noted.  Psychiatric: She has a normal mood and affect.    ED Course  Procedures (including critical care time)  Labs Reviewed  OCCULT BLOOD X 1 CARD TO LAB, STOOL  TROPONIN I  COMPREHENSIVE METABOLIC PANEL  CBC WITH DIFFERENTIAL  URINALYSIS, ROUTINE W REFLEX MICROSCOPIC   No results found.   No diagnosis found.  Date: 09/19/2012  Rate: 65  Rhythm: atrial fibrillation  QRS Axis: left  Intervals: normal  ST/T Wave abnormalities: nonspecific ST/T changes  Conduction Disutrbances:nonspecific intraventricular conduction delay  Narrative Interpretation:   Old EKG Reviewed: No significant change from 07/06/2012 interpreted by me  X-rays reviewed by me Ms O' sullivan's note reviewed from today. Patient's husband and son absolutely did not want her placed in a skilled nursing facility. They wished to take her home if possible today and was hospitalization is needed or they prefer home health services him up but feel that they are adequately prepared  to care for her themselves in the immediate future   Patient ate while in the emergency department. 7:50 PM she is alert and awake and more talkative after treatment with intravenous fluids MDM  Patient has a very supportive family  Clinically patient was dehydrated upon arrival   Plan encourage oral hydration and eating. Followup with Sandford Craze tomorrow to arrange for home health services Diagnosis #1 weakness #2 dehydration #3Frequent falls #4 degenerative arthritis    Doug Sou, MD 09/19/12 2005

## 2012-09-23 ENCOUNTER — Telehealth: Payer: Self-pay | Admitting: *Deleted

## 2012-09-23 NOTE — Telephone Encounter (Signed)
Received call from pt's son, Elijah Birk. States pt was evaluated in ER on 09/19/12 and was given fluids for dehydration. Pt "perked" up and was discharged home. Pt's son and spouse want to know if they can get a standing order for pt to receive fluids in outpt setting when symptoms start to appear that pt is getting dehydrated. Also states that they are looking at getting [facility] in home health assistance for a few hours of a day. They want to know if you feel this is appropriate / sufficient care for the pt at this time?

## 2012-09-24 NOTE — Telephone Encounter (Signed)
Attempted to reach caller w/provider instructions; line busy/SLS

## 2012-09-24 NOTE — Telephone Encounter (Signed)
I think home care is a good idea. Lets talk more about IV fluids more when I see her back in the office for an ED follow up visit. Lets see her back in 1 week.

## 2012-09-25 NOTE — Telephone Encounter (Signed)
Received call from [name], pt's daughter and advised her of instructions below. She voices understanding and states that [facility] may have a form that needs to be completed by the Provider; they will try to bring this to her appt or will fax it before then. Tentative appts set for 09/30/12 at 3:45pm and 10/04/12 at 3:30pm. Daughter will call back and cancel one of these once transportation can be confirmed.

## 2012-09-30 ENCOUNTER — Ambulatory Visit: Payer: Medicare Other | Admitting: Family

## 2012-10-04 ENCOUNTER — Ambulatory Visit (INDEPENDENT_AMBULATORY_CARE_PROVIDER_SITE_OTHER): Payer: Medicare Other | Admitting: Family

## 2012-10-04 ENCOUNTER — Encounter: Payer: Self-pay | Admitting: Family

## 2012-10-04 VITALS — BP 118/78 | HR 71 | Temp 97.7°F | Resp 18 | Wt 86.0 lb

## 2012-10-04 DIAGNOSIS — R634 Abnormal weight loss: Secondary | ICD-10-CM

## 2012-10-04 NOTE — Progress Notes (Signed)
  Subjective:    Patient ID: Leah Harper, female    DOB: 1919-06-28, 79 y.o.   MRN: [id number]  HPI Leah Harper is a 79 yr old female who presents today for follow up.  She was recently treated in the ED for dehydration and released home. Since that time, the family has arranged an aide from [facility] who is coming into the home 4 hours a day. They are finding her care very beneficial to the patient and she is very helpful. The family wishes to try to keep her from having to enter a facility.   Dementia- note some skin irritation from the exelon patch.     Review of Systems     Objective:   Physical Exam  Constitutional: She appears well-developed.  Cachectic elderly white female, NAD.  HENT:  Head: Normocephalic and atraumatic.  Cardiovascular: Normal rate and regular rhythm.   No murmur heard. Pulmonary/Chest: Effort normal and breath sounds normal. No respiratory distress. She has no wheezes. She has no rales. She exhibits no tenderness.  Musculoskeletal: She exhibits no edema.  Neurological: She is alert.  Mildly confused.    Psychiatric: She has a normal mood and affect. Her behavior is normal. Judgment and thought content normal.  Skin- no rash noted at exelon patch sites        Assessment & Plan:  15 minutes spent with pt and family today.  >50% of this time was spent counseling on dementia, home safety, plan of care.

## 2012-10-04 NOTE — Assessment & Plan Note (Addendum)
Will refer for nutrition consult. I agree with home health assistance.  We discussed today plan of care. They do not wish to have aggressive interventions for patient at this time, but do want to treat medically treatable things that come up such as dehydration, uti etc.

## 2012-10-04 NOTE — Patient Instructions (Addendum)
Please follow up in 6 weeks.

## 2012-10-07 ENCOUNTER — Encounter: Payer: Self-pay | Admitting: Family

## 2012-10-07 ENCOUNTER — Telehealth: Payer: Self-pay | Admitting: *Deleted

## 2012-10-07 MED ORDER — EZETIMIBE 10 MG PO TABS: 10.0000 mg | ORAL_TABLET | Freq: Every day | ORAL | Status: DC

## 2012-10-07 NOTE — Telephone Encounter (Signed)
Received fax from pt's husband requesting refill of zetia and metoprolol be sent to [facility].  I do not see metoprolol on pt's med list any longer. Per 08/13/12 cardiology note pt was supposed to stop metoprolol due to bradycardia. Verified with pt's husband that this has been stopped and the only medication currently being requested is zetia. Refill sent to mail order pharmacy.

## 2012-10-09 ENCOUNTER — Telehealth: Payer: Self-pay | Admitting: *Deleted

## 2012-10-09 NOTE — Telephone Encounter (Signed)
Pt's husband brought an Attending Physician Statement to be completed and sent to [facility]. Forms were completed and faxed to [phone].

## 2012-10-15 ENCOUNTER — Telehealth: Payer: Self-pay | Admitting: *Deleted

## 2012-10-15 NOTE — Telephone Encounter (Signed)
Received fax from [facility] that pt's zetia will expire soon. Form completed and forwarded to Provider for signature.

## 2012-10-18 ENCOUNTER — Telehealth: Payer: Self-pay | Admitting: Family

## 2012-10-18 MED ORDER — MEGESTROL ACETATE 40 MG/ML PO SUSP: 400.0000 mg | Freq: Every day | ORAL | Status: DC

## 2012-10-18 NOTE — Telephone Encounter (Signed)
Form faxed to [phone]. Awaiting approval / denial.

## 2012-10-18 NOTE — Telephone Encounter (Signed)
Kim with [facility] is seeing the patient for a home visit.  She is concerned that the patient is not eatting enough.  She is also concerned that her spouse is leaving her alone for extended periods of time.  She feels the patient is unsafe as she has an unstable gait.  Wanted to know if an rx that was being used to stimulate the patients appetite could be restarted.  Please call the nurse to discuss

## 2012-10-18 NOTE — Telephone Encounter (Signed)
Spoke with Selena Batten. She expressed concerns as below.  Please call husband and let him know that I have sent rx for megace for her to restart to help with appetite.  May not be covered by insurance, so they may need to pay out of pocket.  Please also let him know that I would like someone (family member or aid) to be with ms Jaffee at all times please for safety purposes.

## 2012-10-18 NOTE — Telephone Encounter (Signed)
Left message on home # to return my call. 

## 2012-10-23 ENCOUNTER — Encounter: Payer: Self-pay | Admitting: *Deleted

## 2012-10-23 NOTE — Telephone Encounter (Signed)
Notified pt's husband and he voices understanding. 

## 2012-10-23 NOTE — Telephone Encounter (Signed)
Opened in error

## 2012-10-24 ENCOUNTER — Telehealth: Payer: Self-pay | Admitting: Family

## 2012-10-24 NOTE — Telephone Encounter (Signed)
Clarify medication and orders

## 2012-10-25 NOTE — Telephone Encounter (Addendum)
Sandford Craze, NP at 10/18/2012 3:53 PM Spoke with Selena Batten. She expressed concerns as below. Please call husband and let him know that I have sent rx for megace for her to restart to help with appetite. May not be covered by insurance, so they may need to pay out of pocket. Please also let him know that I would like someone (family member or aid) to be with ms Lipson at all times please for safety purposes Kathi Simpers, CMA at 10/23/2012 4:37 PM Notified pt's husband and he voices understanding  Spoke w/Kim @[facility] and gave her the above noted information, she stated that pt's husband has not filled Rx for Megace [saying that someone told him Rx could put pt at increased risk for falling] and is non-compliant w/patient's care needs, stating "that she doesn't need any help and it is unnecessary" & asking "how long are they going to be coming to house". Informed that home aid comes in every morning & leaves at 1:00pm and pt is left alone after that time in the home on most occasions. [facility] recommending Adult Protective Services be contacted for pt safety reasons. Called and spoke with home aid [from [facility]] and she reiterated [facility]'s concerns. She stated that pt's husband did finally fill Megace Rx & was giving it to pt this morning. Aid states that she has spoken with pt's son who is very upset d/t worry & concern for pt's safety [states pt's spouse is planning to take her to the beach for a week in June w/o anyone else to assist]; feels pt's spouse does not want to face the severity of pt's medical issues & gives great resistance against anyone trying to help. Both [facility] & Home Aid report patient is a threat to her own safety if left alone, citing incidences of things [such as boiling water] left on stove & forgotten about, trying to go upstairs by herself, trying to go outside the home by herself, refusing to use walker, and incidences of  medication mix-up & double dosing found/SLS

## 2012-10-25 NOTE — Telephone Encounter (Signed)
Spoke to pt's husband and scheduled f/u for 11/01/12 at 3pm. He states that his son will be in town that day and will try to arrange for his daughter to be here as well.

## 2012-10-25 NOTE — Telephone Encounter (Signed)
Please call Mr. Ortman and let him know that I would like to bring patient and family in for a visit to discuss homecare needs.  It would be helpful if Ms Morawski's children are also able to attend meeting.

## 2012-10-28 NOTE — Telephone Encounter (Signed)
Spoke with Shalon at [employer] and notified that Zetia has been approved from 10/21/12 through 10/21/13. They will refax approval letter.

## 2012-10-30 ENCOUNTER — Telehealth: Payer: Self-pay | Admitting: *Deleted

## 2012-10-30 NOTE — Telephone Encounter (Signed)
Notified RN

## 2012-10-30 NOTE — Telephone Encounter (Signed)
Will review edema and plan of care on Friday.

## 2012-10-30 NOTE — Telephone Encounter (Signed)
Received call from Kim at [facility] stating pt has 2+ pitting edema in her legs x 2 days. Feels pt's husband is not compliant with diet as she reports they have advised him to watch pt's sodium intake. They do not see this is being done, pt eating out. They also request permission to discharge pt from their services as she states pt's husband is resistant / non-compliant with their directions/advice. They report that pt has been cooking her own breakfast and forgetting to turn off the stove while husband is upstairs. Pt has been dosing / taking her own medication. Advised RN that pt and family have appt with Provider on Friday to address these concerns and will wait until that time to make a decision re: home health. Please advise re: edema.

## 2012-11-01 ENCOUNTER — Encounter: Payer: Self-pay | Admitting: Family

## 2012-11-01 ENCOUNTER — Ambulatory Visit (INDEPENDENT_AMBULATORY_CARE_PROVIDER_SITE_OTHER): Payer: Medicare Other | Admitting: Family

## 2012-11-01 VITALS — BP 120/60 | HR 83 | Temp 97.8°F | Ht 65.0 in | Wt 92.5 lb

## 2012-11-01 DIAGNOSIS — R634 Abnormal weight loss: Secondary | ICD-10-CM

## 2012-11-01 DIAGNOSIS — F0390 Unspecified dementia without behavioral disturbance: Secondary | ICD-10-CM

## 2012-11-01 NOTE — Patient Instructions (Addendum)
Stop amlodipine. Follow up in 1 month for BP check.

## 2012-11-01 NOTE — Progress Notes (Signed)
Subjective:    Patient ID: Leah Harper, female    DOB: [birth year], 79 y.o.   MRN: [id number]  HPI  Ms. Kalata is a 79 yr old female with hx of dementia who presents today for follow up.  She is now taking megace and the husband reports that her appetite has improved considerably. Denies SOB or chest pain.    Dementia- she now has a home health aid who is with her until 1 PM.  Home health has been coming into the home.  The home health RN contacted [place] to let us know her concerns re: pt being left alone in the home in the afternoons and concerns that the husband has poor insight into pt's current state.    Review of Systems See HPI  Past Medical History  Diagnosis Date  . Hyperlipidemia   . Hypertension   . AF (atrial fibrillation)   . Hyperthyroidism     remotely in 1941  . Normocytic anemia     2002  . S/P TAH-BSO (total abdominal hysterectomy and bilateral salpingo-oophorectomy)   . Allergy   . Arrhythmia   . Anxiety     History   Social History  . Marital Status: Married    Spouse Name: [name]    Number of Children: 3  . Years of Education: N/A   Occupational History  . retired    Social History Main Topics  . Smoking status: Former Smoker    Quit date: 07/17/1963  . Smokeless tobacco: Never Used  . Alcohol Use: No  . Drug Use: No  . Sexually Active: Not on file   Other Topics Concern  . Not on file   Social History Narrative   Retired   Married -3 children    Tobacco abuse-no   Alcohol use-no     Regular exercise-no    Past Surgical History  Procedure Laterality Date  . Tonsillectomy    . Total abdominal hysterectomy w/ bilateral salpingoophorectomy  1986  . Bilateral salpingoophorectomy    . Bladder suspension      bladder tack with TAHBSO    Family History  Problem Relation Age of Onset  . Heart disease Father   . Heart disease Brother     heart attack  . Heart disease Brother     Allergies  Allergen Reactions  . Statins    Myalgia     Current Outpatient Prescriptions on File Prior to Visit  Medication Sig Dispense Refill  . ALPRAZolam (XANAX) 0.25 MG tablet Take 1 tablet (0.25 mg total) by mouth 2 (two) times daily as needed. For anxiety  30 tablet  0  . aspirin 81 MG tablet Take 81 mg by mouth daily.        Marland Kitchen ENSURE (ENSURE) Take 237 mLs by mouth 2 (two) times daily between meals.        Marland Kitchen ezetimibe (ZETIA) 10 MG tablet Take 1 tablet (10 mg total) by mouth daily.  90 tablet  1  . megestrol (MEGACE) 40 MG/ML suspension Take 10 mLs (400 mg total) by mouth daily.  240 mL  0  . omeprazole (PRILOSEC) 20 MG capsule Take 20 mg by mouth daily.      . rivastigmine (EXELON) 4.6 mg/24hr Place 1 patch onto the skin at bedtime.       No current facility-administered medications on file prior to visit.    BP 120/60  Pulse 83  Temp(Src) 97.8 F (36.6 C) (Oral)  Ht 5\' 5"  (1.651  m)  Wt 92 lb 8 oz (41.958 kg)  BMI 15.39 kg/m2  SpO2 97%       Objective:   Physical Exam  Constitutional: She appears well-developed and well-nourished. No distress.  Cardiovascular: Normal rate and regular rhythm.   No murmur heard. Pulmonary/Chest: Effort normal and breath sounds normal. No respiratory distress. She has no wheezes. She has no rales. She exhibits no tenderness.  Neurological: She is alert.  Psychiatric: She has a normal mood and affect. Her behavior is normal.          Assessment & Plan:

## 2012-11-05 NOTE — Assessment & Plan Note (Signed)
The pt has gained 6 pounds since her last visit.  Continue megace.

## 2012-11-05 NOTE — Assessment & Plan Note (Addendum)
Unchanged.  Continue exelon patch.  A family meeting was held today.  Pt's husband, patient, her daughter and her two son's were in attendance.  We discussed that for safety reasons, Ms. Heinsohn should not be left alone in the home.  The husband and family agree with this.  They are going to look into extending the home health aid's hours as well to give Mr. Camarena some additional time for his own independence. Ms. Fraiser has a very supportive family. The pt is resistant to the additional help as she does not understand why she needs additional supervision, but family continues to work with her on this.  We met for approximately 30 minutes today.  Questions answered.

## 2012-11-06 ENCOUNTER — Telehealth: Payer: Self-pay | Admitting: *Deleted

## 2012-11-06 NOTE — Telephone Encounter (Signed)
Spoke with Selena Batten at [facility] stating she has not been able to reach pt to set up home health visits. Wanted to know the outcome of Friday's visit and wants to know if they should discharge pt from care at this time. Per verbal from Provider visit went well and ok to d/c home health services.

## 2012-11-15 ENCOUNTER — Ambulatory Visit (INDEPENDENT_AMBULATORY_CARE_PROVIDER_SITE_OTHER): Payer: Medicare Other | Admitting: Family

## 2012-11-15 ENCOUNTER — Encounter: Payer: Self-pay | Admitting: Family

## 2012-11-15 VITALS — BP 128/70 | HR 54 | Temp 97.8°F | Resp 18 | Ht 65.0 in | Wt 96.0 lb

## 2012-11-15 DIAGNOSIS — E8809 Other disorders of plasma-protein metabolism, not elsewhere classified: Secondary | ICD-10-CM

## 2012-11-15 DIAGNOSIS — N289 Disorder of kidney and ureter, unspecified: Secondary | ICD-10-CM

## 2012-11-15 DIAGNOSIS — R609 Edema, unspecified: Secondary | ICD-10-CM | POA: Insufficient documentation

## 2012-11-15 LAB — BASIC METABOLIC PANEL
BUN: 29 mg/dL — ABNORMAL HIGH (ref 6–23)
CO2: 23 mEq/L (ref 19–32)
Calcium: 8.7 mg/dL (ref 8.4–10.5)
Creat: 1.57 mg/dL — ABNORMAL HIGH (ref 0.50–1.10)

## 2012-11-15 LAB — HEPATIC FUNCTION PANEL
Indirect Bilirubin: 0.3 mg/dL (ref 0.0–0.9)
Total Protein: 6.1 g/dL (ref 6.0–8.3)

## 2012-11-15 NOTE — Assessment & Plan Note (Signed)
Edema is likely secondary to RV dysfunction and hypoalbuminemia.  She otherwise appears euvolemic. Due to her tendency toward dehydration, I am hesitant to start a diuretic at this time unless her symptoms worsen.  Will check BMET/LFT to assess albumin level and kidney function. Will also refer to nutritionist at family request.

## 2012-11-15 NOTE — Progress Notes (Signed)
Subjective:    Patient ID: Leah Harper, female    DOB: [birth year], 79 y.o.   MRN: [id number]  HPI  Ms. Leah Harper is a 79 yr old female who presents today for follow up.  She is accompanied today by her husband and her daughter.  Swelling- notes ongoing swelling in the feet despite discontinuation of amlodipine. Had Echo 2013 which noted mild LVH, probable right sided heart dysfunction. Normal LVEF.   Review of Systems See HPI  Past Medical History  Diagnosis Date  . Hyperlipidemia   . Hypertension   . AF (atrial fibrillation)   . Hyperthyroidism     remotely in 1941  . Normocytic anemia     2002  . S/P TAH-BSO (total abdominal hysterectomy and bilateral salpingo-oophorectomy)   . Allergy   . Arrhythmia   . Anxiety     History   Social History  . Marital Status: Married    Spouse Name: Leah Harper    Number of Children: 3  . Years of Education: N/A   Occupational History  . retired    Social History Main Topics  . Smoking status: Former Smoker    Quit date: 07/17/1963  . Smokeless tobacco: Never Used  . Alcohol Use: No  . Drug Use: No  . Sexually Active: Not on file   Other Topics Concern  . Not on file   Social History Narrative   Retired   Married -3 children    Tobacco abuse-no   Alcohol use-no     Regular exercise-no    Past Surgical History  Procedure Laterality Date  . Tonsillectomy    . Total abdominal hysterectomy w/ bilateral salpingoophorectomy  1986  . Bilateral salpingoophorectomy    . Bladder suspension      bladder tack with TAHBSO    Family History  Problem Relation Age of Onset  . Heart disease Father   . Heart disease Brother     heart attack  . Heart disease Brother     Allergies  Allergen Reactions  . Statins     Myalgia     Current Outpatient Prescriptions on File Prior to Visit  Medication Sig Dispense Refill  . ALPRAZolam (XANAX) 0.25 MG tablet Take 1 tablet (0.25 mg total) by mouth 2 (two) times daily as needed.  For anxiety  30 tablet  0  . aspirin 81 MG tablet Take 81 mg by mouth daily.        Marland Kitchen ENSURE (ENSURE) Take 237 mLs by mouth 2 (two) times daily between meals.       Marland Kitchen ezetimibe (ZETIA) 10 MG tablet Take 1 tablet (10 mg total) by mouth daily.  90 tablet  1  . megestrol (MEGACE) 40 MG/ML suspension Take 10 mLs (400 mg total) by mouth daily.  240 mL  0  . omeprazole (PRILOSEC) 20 MG capsule Take 20 mg by mouth daily.      . rivastigmine (EXELON) 4.6 mg/24hr Place 1 patch onto the skin at bedtime.       No current facility-administered medications on file prior to visit.    BP 128/70  Pulse 54  Temp(Src) 97.8 F (36.6 C) (Oral)  Resp 18  Ht 5\' 5"  (1.651 m)  Wt 96 lb (43.545 kg)  BMI 15.98 kg/m2  SpO2 100%       Objective:    Physical Exam  Constitutional: She is oriented to person, place, and time. She appears well-developed and well-nourished. No distress.  Cardiovascular: Normal rate and  regular rhythm.   No murmur heard. Pulmonary/Chest: Effort normal and breath sounds normal. No respiratory distress. She has no wheezes. She has no rales. She exhibits no tenderness.  Musculoskeletal:  2+ bilateral LE edema.  Neurological: She is alert and oriented to person, place, and time.  Psychiatric: She has a normal mood and affect. Her behavior is normal. Judgment and thought content normal.          Assessment & Plan:

## 2012-11-15 NOTE — Patient Instructions (Addendum)
Complete lab work prior to leaving.  Follow up in 1 month.   You will be contacted about your referral to the nutritionist.  Continue to weigh daily.  Call if increased swelling, shortness of breath or if weight >100 pounds.

## 2012-11-20 ENCOUNTER — Telehealth: Payer: Self-pay | Admitting: Family

## 2012-11-20 MED ORDER — MEGESTROL ACETATE 40 MG/ML PO SUSP: 400.0000 mg | Freq: Every day | ORAL | Status: DC

## 2012-11-20 NOTE — Telephone Encounter (Signed)
Husband requests refill on megace.

## 2012-12-20 ENCOUNTER — Ambulatory Visit: Payer: Medicare Other | Admitting: Family

## 2012-12-24 ENCOUNTER — Ambulatory Visit (INDEPENDENT_AMBULATORY_CARE_PROVIDER_SITE_OTHER): Payer: Medicare Other | Admitting: Family

## 2012-12-24 ENCOUNTER — Encounter: Payer: Self-pay | Admitting: Family

## 2012-12-24 VITALS — BP 124/80 | HR 80 | Temp 97.7°F | Resp 18 | Ht 65.0 in | Wt 101.0 lb

## 2012-12-24 DIAGNOSIS — R5381 Other malaise: Secondary | ICD-10-CM

## 2012-12-24 DIAGNOSIS — K5792 Diverticulitis of intestine, part unspecified, without perforation or abscess without bleeding: Secondary | ICD-10-CM

## 2012-12-24 DIAGNOSIS — K5732 Diverticulitis of large intestine without perforation or abscess without bleeding: Secondary | ICD-10-CM

## 2012-12-24 DIAGNOSIS — R634 Abnormal weight loss: Secondary | ICD-10-CM

## 2012-12-24 DIAGNOSIS — F0390 Unspecified dementia without behavioral disturbance: Secondary | ICD-10-CM

## 2012-12-24 DIAGNOSIS — R609 Edema, unspecified: Secondary | ICD-10-CM

## 2012-12-24 LAB — POCT URINALYSIS DIPSTICK
Glucose, UA: NEGATIVE
Leukocytes, UA: NEGATIVE
Nitrite, UA: NEGATIVE
Protein, UA: 30
Spec Grav, UA: 1.025
Urobilinogen, UA: 0.2

## 2012-12-24 NOTE — Patient Instructions (Signed)
Follow up in 3 months

## 2012-12-27 LAB — URINE CULTURE: Colony Count: 60000

## 2012-12-28 NOTE — Assessment & Plan Note (Signed)
Unchanged. Counseled pt on need to continue [facility] aid for safety. 15 minutes spent with pt today. >50% of this time was spent counseling pt on her dementia. Weight loss.

## 2012-12-28 NOTE — Assessment & Plan Note (Signed)
Stable clinically compensated. Monitor.

## 2012-12-28 NOTE — Progress Notes (Signed)
  Subjective:    Patient ID: Leah Harper, female    DOB: [birth year], 79 y.o.   MRN: [id number]  HPI Ms. Paradise is a 79 yr old female who presents today with her husband for follow up of her weight and edema. Her husband reports that the pts appetite femains very good on Megace. They continue to bring a carwgiver into the home each day. Husband feels that this is very helpful for him and helps secure her safety while he does some work and errands outside of the home each morning. The pt continues to question need for help and want to know "how long she will need help."   Review of Systems    see hpi Objective:   Physical Exam  Constitutional: She is oriented to person, place, and time.  She appears to be less cachectic, gaining weight.   Cardiovascular: Normal rate and regular rhythm.   No murmur heard. Pulmonary/Chest: Effort normal and breath sounds normal. No respiratory distress. She has no wheezes. She has no rales. She exhibits no tenderness.  Musculoskeletal:  Trace bilateral LE edema  Neurological: She is alert and oriented to person, place, and time.  Skin: Skin is warm and dry.  Psychiatric: She has a normal mood and affect. Her behavior is normal. Judgment and thought content normal.          Assessment & Plan:

## 2012-12-28 NOTE — Assessment & Plan Note (Signed)
Stable. She has gained 5 pounds since last visit. Continue megace.

## 2012-12-30 ENCOUNTER — Other Ambulatory Visit: Payer: Self-pay | Admitting: Family

## 2012-12-30 NOTE — Telephone Encounter (Signed)
Rx request to pharmacy/SLS  

## 2013-01-01 NOTE — Progress Notes (Signed)
Quick Note:  Patients caretaker Lupita Leash Informed and voiced understanding. I tried to talk to pt but she passed the phone to Lupita Leash ______

## 2013-01-06 ENCOUNTER — Telehealth: Payer: Self-pay | Admitting: *Deleted

## 2013-01-06 DIAGNOSIS — R5383 Other fatigue: Secondary | ICD-10-CM

## 2013-01-06 NOTE — Telephone Encounter (Signed)
Spoke with pt's husband and he states that pt is still symptomatic of possible UTI. Still very fatigued. Per last lab note (12/24/12) pt was advised to bring additional sample if symptoms persisted as urine culture previously positive for multiple organisms and recommended recollection. Pt will come by the office around 2:30pm today to provider urine sample.

## 2013-01-06 NOTE — Addendum Note (Signed)
Addended by: Mervin Kung A on: 01/06/2013 04:56 PM   Modules accepted: Orders

## 2013-01-06 NOTE — Telephone Encounter (Signed)
Pt left specimen in the office today. Order sent to lab.

## 2013-01-07 LAB — URINALYSIS, ROUTINE W REFLEX MICROSCOPIC
Bilirubin Urine: NEGATIVE
Ketones, ur: NEGATIVE mg/dL
Nitrite: NEGATIVE
Specific Gravity, Urine: 1.019 (ref 1.005–1.030)
Urobilinogen, UA: 0.2 mg/dL (ref 0.0–1.0)
pH: 5.5 (ref 5.0–8.0)

## 2013-01-07 LAB — URINALYSIS, MICROSCOPIC ONLY
Bacteria, UA: NONE SEEN
Casts: NONE SEEN
Crystals: NONE SEEN

## 2013-01-07 LAB — URINE CULTURE
Colony Count: NO GROWTH
Organism ID, Bacteria: NO GROWTH

## 2013-01-08 NOTE — Telephone Encounter (Signed)
Quick Note:  Patients spouse informed and voiced understanding ______

## 2013-01-20 ENCOUNTER — Telehealth: Payer: Self-pay | Admitting: Cardiovascular Disease

## 2013-01-20 NOTE — Telephone Encounter (Signed)
LMTCB

## 2013-01-20 NOTE — Telephone Encounter (Signed)
New Prob     Would like to speak to someone about getting pt is ASAP. States pt is experiencing SOB and weakness. Please call.

## 2013-01-20 NOTE — Telephone Encounter (Addendum)
Patient's son called back. States that his Mom was seen several months ago and her metoprolol was stopped due to bradycardia. He states that he was concerned that she was supposed to have a monitor after she saw Tereso Newcomer PA but this was never completed. She is currently complaining of generalized weakness,swelling in legs, and SOB with ambulation for the past several weeks. Then I spoke with the patient's husband and he states that her heart rate has been normal since the medication was stopped and BP today was 120/80. She sees primary care NP [facility] frequently. He wanted to know if she could see Tereso Newcomer PA or Dr.Nishan tomorrow. Advised no availability but advised that they see primary care NP because Mr.Cafarella feels like she will be able to see her tomorrow.  He was instructed to take her to the ER if she becomes more SOB. I also advised them to have M. O'Sulllivan NP call us tomorrow if she feels that the patient needs to come in for a cardiology work up.

## 2013-01-21 ENCOUNTER — Encounter: Payer: Self-pay | Admitting: Family

## 2013-01-21 ENCOUNTER — Ambulatory Visit (INDEPENDENT_AMBULATORY_CARE_PROVIDER_SITE_OTHER): Payer: Medicare Other | Admitting: Family

## 2013-01-21 VITALS — BP 126/86 | HR 83 | Temp 97.4°F | Resp 24 | Wt 114.0 lb

## 2013-01-21 DIAGNOSIS — R634 Abnormal weight loss: Secondary | ICD-10-CM

## 2013-01-21 DIAGNOSIS — R5381 Other malaise: Secondary | ICD-10-CM

## 2013-01-21 DIAGNOSIS — R609 Edema, unspecified: Secondary | ICD-10-CM

## 2013-01-21 DIAGNOSIS — R3129 Other microscopic hematuria: Secondary | ICD-10-CM

## 2013-01-21 DIAGNOSIS — R0602 Shortness of breath: Secondary | ICD-10-CM

## 2013-01-21 LAB — POCT URINALYSIS DIPSTICK
Ketones, UA: NEGATIVE
Protein, UA: 30
Spec Grav, UA: 1.01
Urobilinogen, UA: 0.2
pH, UA: 6

## 2013-01-21 MED ORDER — FUROSEMIDE 20 MG PO TABS: 20.0000 mg | ORAL_TABLET | Freq: Every day | ORAL | Status: DC

## 2013-01-21 NOTE — Progress Notes (Signed)
Subjective:    Patient ID: Leah Harper, female    DOB: March 06, 1920, 78 y.o.   MRN: [id number]  HPI  Leah Harper is a 79 yr old female who presents today with her husband and daughter with chief complaint of fatigue. She also has associated sob. She has gained 13 pounds this month. Symptoms worsened yesterday.  The patient is eating well. SOB is worse with exertion.  She has associated LE edema.  Denies CP.  Denies urinary frequency/dysuria or fever.      Review of Systems See HPI  Past Medical History  Diagnosis Date  . Hyperlipidemia   . Hypertension   . AF (atrial fibrillation)   . Hyperthyroidism     remotely in 1941  . Normocytic anemia     2002  . S/P TAH-BSO (total abdominal hysterectomy and bilateral salpingo-oophorectomy)   . Allergy   . Arrhythmia   . Anxiety     History   Social History  . Marital Status: Married    Spouse Name: [name]    Number of Children: 3  . Years of Education: N/A   Occupational History  . retired    Social History Main Topics  . Smoking status: Former Smoker    Quit date: 07/17/1963  . Smokeless tobacco: Never Used  . Alcohol Use: No  . Drug Use: No  . Sexually Active: Not on file   Other Topics Concern  . Not on file   Social History Narrative   Retired   Married -3 children    Tobacco abuse-no   Alcohol use-no     Regular exercise-no    Past Surgical History  Procedure Laterality Date  . Tonsillectomy    . Total abdominal hysterectomy w/ bilateral salpingoophorectomy  1986  . Bilateral salpingoophorectomy    . Bladder suspension      bladder tack with TAHBSO    Family History  Problem Relation Age of Onset  . Heart disease Father   . Heart disease Brother     heart attack  . Heart disease Brother     Allergies  Allergen Reactions  . Statins     Myalgia     Current Outpatient Prescriptions on File Prior to Visit  Medication Sig Dispense Refill  . ALPRAZolam (XANAX) 0.25 MG tablet Take 1 tablet  (0.25 mg total) by mouth 2 (two) times daily as needed. For anxiety  30 tablet  0  . aspirin 81 MG tablet Take 81 mg by mouth daily.        Marland Kitchen ENSURE (ENSURE) Take 237 mLs by mouth 2 (two) times daily between meals.       Marland Kitchen ezetimibe (ZETIA) 10 MG tablet Take 1 tablet (10 mg total) by mouth daily.  90 tablet  1  . megestrol (MEGACE) 40 MG/ML suspension TAKE 10 MLS (400 MG TOTAL) BY MOUTH DAILY.  240 mL  0  . omeprazole (PRILOSEC) 20 MG capsule Take 20 mg by mouth daily.      . rivastigmine (EXELON) 4.6 mg/24hr Place 1 patch onto the skin at bedtime.       No current facility-administered medications on file prior to visit.    BP 126/86  Pulse 83  Temp(Src) 97.4 F (36.3 C) (Oral)  Resp 24  Wt 114 lb (51.71 kg)  BMI 18.97 kg/m2  SpO2 99%       Objective:   Physical Exam  Constitutional: She appears well-developed and well-nourished. No distress.  Cardiovascular: Normal rate and regular  rhythm.   No murmur heard. Pulmonary/Chest: Effort normal and breath sounds normal. No respiratory distress. She has no wheezes. She has no rales. She exhibits no tenderness.  Musculoskeletal:  2-3+ bilateral LE edema  Neurological:  Pleasant, but memory appears impaired  Psychiatric: She has a normal mood and affect. Her behavior is normal. Judgment and thought content normal.          Assessment & Plan:  Reviewed case with Tereso Newcomer PA-C with cardiology.

## 2013-01-21 NOTE — Patient Instructions (Addendum)
Hold off on megace for now. Start furosemide once daily in the morning. Follow up in 1 week. You will be contacted about your appointment to cardiology in the next 1-2 weeks.

## 2013-01-22 ENCOUNTER — Telehealth: Payer: Self-pay | Admitting: Family

## 2013-01-22 LAB — BASIC METABOLIC PANEL
BUN: 48 mg/dL — ABNORMAL HIGH (ref 6–23)
Chloride: 106 mEq/L (ref 96–112)
Creat: 1.85 mg/dL — ABNORMAL HIGH (ref 0.50–1.10)
Glucose, Bld: 108 mg/dL — ABNORMAL HIGH (ref 70–99)
Potassium: 4.6 mEq/L (ref 3.5–5.3)

## 2013-01-22 LAB — URINE CULTURE: Colony Count: NO GROWTH

## 2013-01-22 NOTE — Assessment & Plan Note (Signed)
Her weight is up both due to fluid retention and improved dietary intake. I have advised family to stop megace for now. We will monitor her appetite.

## 2013-01-22 NOTE — Assessment & Plan Note (Addendum)
Deteriorated. Suspect associated CHF.  EKG is reviewed with Tereso Newcomer PA-c cardiology.  EKG is felt to be stable.  Will add furosemide 20mg  once daily and plan follow up in 1 week. Cardiology will arrange follow up in their office in1-2 weeks. Obtain bmet and BNP.

## 2013-01-22 NOTE — Telephone Encounter (Signed)
Please call Mr. Leah Harper and let him know that her kidney function has worsened slightly.  I would like to get her back in to see Dr. Briant Cedar. Also,  Heart failure test is up showing that she is holding on to extra fluid.  I expected this result given her swelling.  She should take lasix once daily as we discussed at her visit. And I will see her back on 8/5 as scheduled.

## 2013-01-23 NOTE — Telephone Encounter (Signed)
Notified pts husband.

## 2013-01-28 ENCOUNTER — Ambulatory Visit (INDEPENDENT_AMBULATORY_CARE_PROVIDER_SITE_OTHER): Payer: Medicare Other | Admitting: Family

## 2013-01-28 ENCOUNTER — Ambulatory Visit (HOSPITAL_BASED_OUTPATIENT_CLINIC_OR_DEPARTMENT_OTHER)
Admission: RE | Admit: 2013-01-28 | Discharge: 2013-01-28 | Disposition: A | Discharge status: Home / Self Care | Payer: Medicare Other | Source: Ambulatory Visit | Attending: Family | Admitting: Family

## 2013-01-28 ENCOUNTER — Telehealth: Payer: Self-pay | Admitting: Family

## 2013-01-28 ENCOUNTER — Encounter: Payer: Self-pay | Admitting: Family

## 2013-01-28 VITALS — BP 138/80 | HR 74 | Temp 98.0°F | Resp 18 | Wt 109.0 lb

## 2013-01-28 DIAGNOSIS — M899 Disorder of bone, unspecified: Secondary | ICD-10-CM | POA: Insufficient documentation

## 2013-01-28 DIAGNOSIS — Z9181 History of falling: Secondary | ICD-10-CM | POA: Insufficient documentation

## 2013-01-28 DIAGNOSIS — I509 Heart failure, unspecified: Secondary | ICD-10-CM | POA: Insufficient documentation

## 2013-01-28 DIAGNOSIS — Z5181 Encounter for therapeutic drug level monitoring: Secondary | ICD-10-CM

## 2013-01-28 DIAGNOSIS — M25559 Pain in unspecified hip: Secondary | ICD-10-CM

## 2013-01-28 DIAGNOSIS — D649 Anemia, unspecified: Secondary | ICD-10-CM

## 2013-01-28 DIAGNOSIS — R5383 Other fatigue: Secondary | ICD-10-CM | POA: Insufficient documentation

## 2013-01-28 DIAGNOSIS — M25551 Pain in right hip: Secondary | ICD-10-CM | POA: Insufficient documentation

## 2013-01-28 DIAGNOSIS — R5381 Other malaise: Secondary | ICD-10-CM | POA: Insufficient documentation

## 2013-01-28 DIAGNOSIS — M479 Spondylosis, unspecified: Secondary | ICD-10-CM | POA: Insufficient documentation

## 2013-01-28 LAB — CBC WITH DIFFERENTIAL/PLATELET
Basophils Absolute: 0 10*3/uL (ref 0.0–0.1)
Basophils Relative: 0 % (ref 0–1)
Eosinophils Absolute: 0.1 10*3/uL (ref 0.0–0.7)
Eosinophils Relative: 2 % (ref 0–5)
Lymphs Abs: 1.8 10*3/uL (ref 0.7–4.0)
MCH: 32.9 pg (ref 26.0–34.0)
MCHC: 34.8 g/dL (ref 30.0–36.0)
MCV: 94.5 fL (ref 78.0–100.0)
Neutrophils Relative %: 65 % (ref 43–77)
Platelets: 275 10*3/uL (ref 150–400)
RBC: 4.17 MIL/uL (ref 3.87–5.11)
RDW: 14.5 % (ref 11.5–15.5)

## 2013-01-28 LAB — BASIC METABOLIC PANEL
BUN: 54 mg/dL — ABNORMAL HIGH (ref 6–23)
Chloride: 103 mEq/L (ref 96–112)
Creat: 2.17 mg/dL — ABNORMAL HIGH (ref 0.50–1.10)
Glucose, Bld: 83 mg/dL (ref 70–99)

## 2013-01-28 MED ORDER — ALPRAZOLAM 0.25 MG PO TABS: 0.2500 mg | ORAL_TABLET | Freq: Two times a day (BID) | ORAL | Status: DC | PRN

## 2013-01-28 NOTE — Assessment & Plan Note (Signed)
Will obtain CBC  

## 2013-01-28 NOTE — Patient Instructions (Addendum)
Please complete your lab work prior to leaving. Follow up in 1 month. Keep your upcoming appointment with cardiology.  Continue lasix (furosemide)

## 2013-01-28 NOTE — Assessment & Plan Note (Addendum)
Improving on lasix. Continue same, obtain bmet.  Pt has upcoming appointment with cardiology. I advised husband to weigh pt daily and to call if further weight gain. As we are concerned about volume overload. From a dietary standpoint, he reports that her appetite is good despite discontinuation of megace. CXR is performed today and does not note any pulmonary edema.

## 2013-01-28 NOTE — Assessment & Plan Note (Addendum)
Hip x ray is performed today and is negative for acute changes. ? Bursitis. Will refer to sports medicine for further evaluation.

## 2013-01-28 NOTE — Progress Notes (Signed)
Subjective:    Patient ID: Leah Harper, female    DOB: 04-11-1920, 79 y.o.   MRN: [id number]  HPI  [name] is a 79 yr old female who presents today for her 1 week follow up of her CHF exacerbation. Last visit she presented with fatigue, DOE, weight gain and LE edema.  BNP was elevated at 678. Furosemide 20mg  was added to her medications.  She has lost 5 pounds since that time. Husband reports significant improvement in LE edema. She continues to have some sob when laying flat.   She reports ongoing right hip pain making it difficult to walk.  She denies recent fall. Still complaining of fatigue.  Review of Systems See HPI  Past Medical History  Diagnosis Date  . Hyperlipidemia   . Hypertension   . AF (atrial fibrillation)   . Hyperthyroidism     remotely in 1941  . Normocytic anemia     2002  . S/P TAH-BSO (total abdominal hysterectomy and bilateral salpingo-oophorectomy)   . Allergy   . Arrhythmia   . Anxiety     History   Social History  . Marital Status: Married    Spouse Name: [name]    Number of Children: 3  . Years of Education: N/A   Occupational History  . retired    Social History Main Topics  . Smoking status: Former Smoker    Quit date: 07/17/1963  . Smokeless tobacco: Never Used  . Alcohol Use: No  . Drug Use: No  . Sexually Active: Not on file   Other Topics Concern  . Not on file   Social History Narrative   Retired   Married -3 children    Tobacco abuse-no   Alcohol use-no     Regular exercise-no    Past Surgical History  Procedure Laterality Date  . Tonsillectomy    . Total abdominal hysterectomy w/ bilateral salpingoophorectomy  1986  . Bilateral salpingoophorectomy    . Bladder suspension      bladder tack with TAHBSO    Family History  Problem Relation Age of Onset  . Heart disease Father   . Heart disease Brother     heart attack  . Heart disease Brother     Allergies  Allergen Reactions  . Statins     Myalgia   . Tylenol (Acetaminophen)     Facial rash    Current Outpatient Prescriptions on File Prior to Visit  Medication Sig Dispense Refill  . ALPRAZolam (XANAX) 0.25 MG tablet Take 1 tablet (0.25 mg total) by mouth 2 (two) times daily as needed. For anxiety  30 tablet  0  . aspirin 81 MG tablet Take 81 mg by mouth daily.        Marland Kitchen ENSURE (ENSURE) Take 237 mLs by mouth 2 (two) times daily between meals.       Marland Kitchen ezetimibe (ZETIA) 10 MG tablet Take 1 tablet (10 mg total) by mouth daily.  90 tablet  1  . furosemide (LASIX) 20 MG tablet Take 1 tablet (20 mg total) by mouth daily.  30 tablet  0  . megestrol (MEGACE) 40 MG/ML suspension TAKE 10 MLS (400 MG TOTAL) BY MOUTH DAILY.  240 mL  0  . omeprazole (PRILOSEC) 20 MG capsule Take 20 mg by mouth daily.      . rivastigmine (EXELON) 4.6 mg/24hr Place 1 patch onto the skin at bedtime.       No current facility-administered medications on file prior to visit.  BP 138/80  Pulse 74  Temp(Src) 98 F (36.7 C) (Oral)  Resp 18  Wt 109 lb (49.442 kg)  BMI 18.14 kg/m2  SpO2 97%       Objective:   Physical Exam  Constitutional: She appears well-developed and well-nourished.  HENT:  Head: Normocephalic and atraumatic.  Cardiovascular: Normal rate and regular rhythm.   No murmur heard. Pulmonary/Chest: Effort normal. She exhibits no tenderness.  Few bibasilar crackles noted  Musculoskeletal:  1-2+ bilateral LE edema  Neurological: She is alert.  Skin: Skin is warm and dry.  Psychiatric: She has a normal mood and affect.          Assessment & Plan:

## 2013-01-28 NOTE — Assessment & Plan Note (Signed)
Pt has upcoming appointment with renal.

## 2013-01-28 NOTE — Telephone Encounter (Signed)
pls call pt's husband and notify him that cxr negative for pneumonia. Hip x ray negative for fracture.  I would recommend that she meet with Dr. Pearletha Forge for evaluation of her hip pain.

## 2013-01-28 NOTE — Telephone Encounter (Signed)
Notified pt's husband and he voices understanding. Called in refill of alprazolam, #30 x no refills per verbal ok from Provider.

## 2013-01-30 ENCOUNTER — Encounter: Payer: Self-pay | Admitting: Family Medicine

## 2013-01-30 ENCOUNTER — Ambulatory Visit (INDEPENDENT_AMBULATORY_CARE_PROVIDER_SITE_OTHER): Payer: Medicare Other | Admitting: Family Medicine

## 2013-01-30 VITALS — BP 125/74 | HR 84 | Ht 65.0 in | Wt 109.0 lb

## 2013-01-30 DIAGNOSIS — M25551 Pain in right hip: Secondary | ICD-10-CM

## 2013-01-30 DIAGNOSIS — M25559 Pain in unspecified hip: Secondary | ICD-10-CM

## 2013-01-30 NOTE — Patient Instructions (Addendum)
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 Glucosamine sulfate 750mg  twice a day is a supplement that may help. Capsaicin topically up to four times a day as needed may also help with pain (other topical ones you can try include flexall or aspercreme). AVOID taking tylenol (because of allergy), ibuprofen and aleve (because of your kidney function). Consider physical therapy to strengthen muscles around the joint that hurts to take pressure off of the joint itself. Shoe inserts with good arch support may be helpful. Walker or cane if needed. Heat or ice 15 minutes at a time 3-4 times a day as needed to help with pain. Water aerobics and cycling with low resistance are the best two types of exercise for arthritis. If your pain is severe I would like to see and examine you at that time. Follow up with me as needed.

## 2013-02-03 ENCOUNTER — Encounter: Payer: Self-pay | Admitting: Family Medicine

## 2013-02-03 DIAGNOSIS — M25551 Pain in right hip: Secondary | ICD-10-CM | POA: Insufficient documentation

## 2013-02-03 NOTE — Progress Notes (Signed)
Patient ID: Leah Harper, female   DOB: Aug 21, 1919, 79 y.o.   MRN: [id number]  PCP: Lemont Fillers., NP  Subjective:   HPI: Patient is a 79 y.o. female here for bilateral hip pain.  Patient is here with husband. [place] she improved from these, has been able to ambulate. Then over past couple months she's intermittently had bilateral hip pain. Worse with walking - moving more slowly. Not taking any medicines for this. Not currently hurting. No back pain. No numbness/tingling.  Past Medical History  Diagnosis Date  . Hyperlipidemia   . Hypertension   . AF (atrial fibrillation)   . Hyperthyroidism     remotely in 1941  . Normocytic anemia     2002  . S/P TAH-BSO (total abdominal hysterectomy and bilateral salpingo-oophorectomy)   . Allergy   . Arrhythmia   . Anxiety     Current Outpatient Prescriptions on File Prior to Visit  Medication Sig Dispense Refill  . ALPRAZolam (XANAX) 0.25 MG tablet Take 1 tablet (0.25 mg total) by mouth 2 (two) times daily as needed. For anxiety  30 tablet  0  . aspirin 81 MG tablet Take 81 mg by mouth daily.        Marland Kitchen ENSURE (ENSURE) Take 237 mLs by mouth 2 (two) times daily between meals.       Marland Kitchen ezetimibe (ZETIA) 10 MG tablet Take 1 tablet (10 mg total) by mouth daily.  90 tablet  1  . furosemide (LASIX) 20 MG tablet Take 1 tablet (20 mg total) by mouth daily.  30 tablet  0  . omeprazole (PRILOSEC) 20 MG capsule Take 20 mg by mouth daily.      . rivastigmine (EXELON) 4.6 mg/24hr Place 1 patch onto the skin at bedtime.       No current facility-administered medications on file prior to visit.    Past Surgical History  Procedure Laterality Date  . Tonsillectomy    . Total abdominal hysterectomy w/ bilateral salpingoophorectomy  1986  . Bilateral salpingoophorectomy    . Bladder suspension      bladder tack with TAHBSO    Allergies  Allergen Reactions  . Statins    Myalgia   . Tylenol (Acetaminophen)     Facial rash    History   Social History  . Marital Status: Married    Spouse Name: [name]    Number of Children: 3  . Years of Education: N/A   Occupational History  . retired    Social History Main Topics  . Smoking status: Former Smoker    Quit date: 07/17/1963  . Smokeless tobacco: Never Used  . Alcohol Use: No  . Drug Use: No  . Sexually Active: Not on file   Other Topics Concern  . Not on file   Social History Narrative   Retired   Married -3 children    Tobacco abuse-no   Alcohol use-no     Regular exercise-no    Family History  Problem Relation Age of Onset  . Heart disease Father   . Hyperlipidemia Father   . Heart attack Father   . Heart disease Brother     heart attack  . Heart disease Brother     BP 125/74  Pulse 84  Ht 5\' 5"  (1.651 m)  Wt 109 lb (49.442 kg)  BMI 18.14 kg/m2  Review of Systems: See HPI above.    Objective:  Physical Exam:  Gen: NAD  Back/Hips: No gross deformity. No paraspinal TTP.  No midline or bony TTP.  No trochanter, other TTP. Only mild limitation of ROM hips. Strength LEs 5/5 all muscle groups.   2+ MSRs in patellar and achilles tendons, equal bilaterally. Negative SLRs. Sensation intact to light touch bilaterally. Negative logroll bilateral hips    Assessment & Plan:  1. Bilateral hip pain - intermittent and no pain currently.  Radiographs reviewed - she does have mild arthritis which is likely the cause of her bilateral pain.  Otherwise normal exam.  Discussed otc meds she can try for this.  Walker if needed.  F/u prn.

## 2013-02-03 NOTE — Assessment & Plan Note (Signed)
intermittent and no pain currently.  Radiographs reviewed - she does have mild arthritis which is likely the cause of her bilateral pain.  Otherwise normal exam.  Discussed otc meds she can try for this.  Walker if needed.  F/u prn.

## 2013-02-12 ENCOUNTER — Encounter: Payer: Self-pay | Admitting: Physician Assistant

## 2013-02-12 ENCOUNTER — Inpatient Hospital Stay (HOSPITAL_COMMUNITY)
Admission: AD | Admit: 2013-02-12 | Discharge: 2013-02-18 | DRG: 308 | Disposition: A | Discharge status: Home Health | Payer: Medicare Other | Source: Ambulatory Visit | Attending: Internal Medicine | Admitting: Internal Medicine

## 2013-02-12 ENCOUNTER — Encounter (HOSPITAL_COMMUNITY): Payer: Self-pay | Admitting: General Practice

## 2013-02-12 ENCOUNTER — Ambulatory Visit (INDEPENDENT_AMBULATORY_CARE_PROVIDER_SITE_OTHER): Payer: Medicare Other | Admitting: Physician Assistant

## 2013-02-12 VITALS — BP 128/94 | HR 168 | Ht 65.0 in | Wt 109.1 lb

## 2013-02-12 DIAGNOSIS — N179 Acute kidney failure, unspecified: Secondary | ICD-10-CM | POA: Diagnosis not present

## 2013-02-12 DIAGNOSIS — I498 Other specified cardiac arrhythmias: Secondary | ICD-10-CM | POA: Diagnosis present

## 2013-02-12 DIAGNOSIS — Z87891 Personal history of nicotine dependence: Secondary | ICD-10-CM

## 2013-02-12 DIAGNOSIS — I509 Heart failure, unspecified: Secondary | ICD-10-CM | POA: Diagnosis present

## 2013-02-12 DIAGNOSIS — R0602 Shortness of breath: Secondary | ICD-10-CM

## 2013-02-12 DIAGNOSIS — J438 Other emphysema: Secondary | ICD-10-CM | POA: Diagnosis present

## 2013-02-12 DIAGNOSIS — I4891 Unspecified atrial fibrillation: Principal | ICD-10-CM | POA: Diagnosis present

## 2013-02-12 DIAGNOSIS — Z7901 Long term (current) use of anticoagulants: Secondary | ICD-10-CM

## 2013-02-12 DIAGNOSIS — E785 Hyperlipidemia, unspecified: Secondary | ICD-10-CM

## 2013-02-12 DIAGNOSIS — I5033 Acute on chronic diastolic (congestive) heart failure: Secondary | ICD-10-CM | POA: Diagnosis present

## 2013-02-12 DIAGNOSIS — I129 Hypertensive chronic kidney disease with stage 1 through stage 4 chronic kidney disease, or unspecified chronic kidney disease: Secondary | ICD-10-CM

## 2013-02-12 DIAGNOSIS — I10 Essential (primary) hypertension: Secondary | ICD-10-CM

## 2013-02-12 DIAGNOSIS — N184 Chronic kidney disease, stage 4 (severe): Secondary | ICD-10-CM | POA: Diagnosis present

## 2013-02-12 HISTORY — DX: Heart failure, unspecified: I50.9

## 2013-02-12 HISTORY — DX: Gastro-esophageal reflux disease without esophagitis: K21.9

## 2013-02-12 HISTORY — DX: Unspecified dementia, unspecified severity, without behavioral disturbance, psychotic disturbance, mood disturbance, and anxiety: F03.90

## 2013-02-12 HISTORY — DX: Renal tubulo-interstitial disease, unspecified: N15.9

## 2013-02-12 LAB — CBC WITH DIFFERENTIAL/PLATELET
Eosinophils Relative: 2 % (ref 0–5)
HCT: 36 % (ref 36.0–46.0)
Lymphocytes Relative: 28 % (ref 12–46)
Lymphs Abs: 1.8 10*3/uL (ref 0.7–4.0)
MCV: 94.7 fL (ref 78.0–100.0)
Monocytes Absolute: 0.6 10*3/uL (ref 0.1–1.0)
Monocytes Relative: 10 % (ref 3–12)
RBC: 3.8 MIL/uL — ABNORMAL LOW (ref 3.87–5.11)
WBC: 6.4 10*3/uL (ref 4.0–10.5)

## 2013-02-12 LAB — COMPREHENSIVE METABOLIC PANEL
ALT: 18 U/L (ref 0–35)
CO2: 23 mEq/L (ref 19–32)
Calcium: 9.2 mg/dL (ref 8.4–10.5)
Creatinine, Ser: 1.95 mg/dL — ABNORMAL HIGH (ref 0.50–1.10)
GFR calc Af Amer: 25 mL/min — ABNORMAL LOW (ref >90)
GFR calc non Af Amer: 21 mL/min — ABNORMAL LOW (ref >90)
Glucose, Bld: 173 mg/dL — ABNORMAL HIGH (ref 70–99)

## 2013-02-12 LAB — PRO B NATRIURETIC PEPTIDE: Pro B Natriuretic peptide (BNP): 10352 pg/mL — ABNORMAL HIGH (ref 0–450)

## 2013-02-12 LAB — TSH: TSH: 1.888 u[IU]/mL (ref 0.350–4.500)

## 2013-02-12 MED ORDER — FUROSEMIDE 10 MG/ML IJ SOLN
40.0000 mg | INTRAMUSCULAR | Status: AC
  Administered 2013-02-12: 40 mg via INTRAVENOUS
  Filled 2013-02-12: qty 4

## 2013-02-12 MED ORDER — SODIUM CHLORIDE 0.9 % IV SOLN: 250.0000 mL | INTRAVENOUS | Status: DC | PRN

## 2013-02-12 MED ORDER — SODIUM CHLORIDE 0.9 % IJ SOLN: 3.0000 mL | INTRAMUSCULAR | Status: DC | PRN

## 2013-02-12 MED ORDER — ACETAMINOPHEN 325 MG PO TABS: 650.0000 mg | ORAL_TABLET | ORAL | Status: DC | PRN

## 2013-02-12 MED ORDER — FUROSEMIDE 10 MG/ML IJ SOLN: 40.0000 mg | Freq: Two times a day (BID) | INTRAMUSCULAR | Status: DC

## 2013-02-12 MED ORDER — ASPIRIN EC 81 MG PO TBEC: 81.0000 mg | DELAYED_RELEASE_TABLET | Freq: Every day | ORAL | Status: DC

## 2013-02-12 MED ORDER — APIXABAN 2.5 MG PO TABS: 2.5000 mg | ORAL_TABLET | Freq: Two times a day (BID) | ORAL | Status: DC

## 2013-02-12 MED ORDER — FUROSEMIDE 10 MG/ML IJ SOLN
40.0000 mg | Freq: Two times a day (BID) | INTRAMUSCULAR | Status: DC
  Administered 2013-02-12 – 2013-02-13 (×3): 40 mg via INTRAVENOUS
  Filled 2013-02-12 (×7): qty 4

## 2013-02-12 MED ORDER — DILTIAZEM HCL 30 MG PO TABS
30.0000 mg | ORAL_TABLET | Freq: Four times a day (QID) | ORAL | Status: DC
  Filled 2013-02-12 (×2): qty 1

## 2013-02-12 MED ORDER — DILTIAZEM HCL 100 MG IV SOLR: 5.0000 mg/h | INTRAVENOUS | Status: DC

## 2013-02-12 MED ORDER — SODIUM CHLORIDE 0.9 % IJ SOLN: 3.0000 mL | Freq: Two times a day (BID) | INTRAMUSCULAR | Status: DC

## 2013-02-12 MED ORDER — EZETIMIBE 10 MG PO TABS
10.0000 mg | ORAL_TABLET | Freq: Every day | ORAL | Status: DC
  Administered 2013-02-12 – 2013-02-18 (×7): 10 mg via ORAL
  Filled 2013-02-12 (×8): qty 1

## 2013-02-12 MED ORDER — DILTIAZEM HCL 100 MG IV SOLR
5.0000 mg/h | INTRAVENOUS | Status: DC
  Administered 2013-02-12: 5 mg/h via INTRAVENOUS
  Administered 2013-02-12: 10 mg/h via INTRAVENOUS
  Administered 2013-02-13: 15 mg/h via INTRAVENOUS
  Filled 2013-02-12 (×2): qty 100

## 2013-02-12 MED ORDER — SODIUM CHLORIDE 0.9 % IV SOLN: 4.0000 mg | Freq: Four times a day (QID) | INTRAVENOUS | Status: DC | PRN

## 2013-02-12 MED ORDER — APIXABAN 2.5 MG PO TABS
2.5000 mg | ORAL_TABLET | Freq: Two times a day (BID) | ORAL | Status: DC
  Administered 2013-02-12 – 2013-02-18 (×13): 2.5 mg via ORAL
  Filled 2013-02-12 (×16): qty 1

## 2013-02-12 MED ORDER — PANTOPRAZOLE SODIUM 40 MG PO TBEC
40.0000 mg | DELAYED_RELEASE_TABLET | Freq: Every day | ORAL | Status: DC
  Administered 2013-02-12 – 2013-02-18 (×6): 40 mg via ORAL
  Filled 2013-02-12 (×6): qty 1

## 2013-02-12 MED ORDER — ONDANSETRON HCL 4 MG/2ML IJ SOLN: 4.0000 mg | Freq: Four times a day (QID) | INTRAMUSCULAR | Status: DC | PRN

## 2013-02-12 MED ORDER — ALPRAZOLAM 0.25 MG PO TABS
0.2500 mg | ORAL_TABLET | Freq: Two times a day (BID) | ORAL | Status: DC | PRN
  Administered 2013-02-12 – 2013-02-17 (×2): 0.25 mg via ORAL
  Filled 2013-02-12 (×2): qty 1

## 2013-02-12 MED ORDER — NITROGLYCERIN 0.4 MG SL SUBL: 0.4000 mg | SUBLINGUAL_TABLET | SUBLINGUAL | Status: DC | PRN

## 2013-02-12 MED ORDER — DILTIAZEM LOAD VIA INFUSION
10.0000 mg | Freq: Once | INTRAVENOUS | Status: AC
  Administered 2013-02-12: 10 mg via INTRAVENOUS
  Filled 2013-02-12: qty 10

## 2013-02-12 MED ORDER — FUROSEMIDE 10 MG/ML IJ SOLN: 40.0000 mg | Freq: Every day | INTRAMUSCULAR | Status: DC

## 2013-02-12 MED ORDER — DILTIAZEM LOAD VIA INFUSION: 5.0000 mg | Freq: Once | INTRAVENOUS | Status: DC

## 2013-02-12 MED ORDER — RIVASTIGMINE 4.6 MG/24HR TD PT24
4.6000 mg | MEDICATED_PATCH | Freq: Every day | TRANSDERMAL | Status: DC
  Administered 2013-02-13 – 2013-02-17 (×5): 4.6 mg via TRANSDERMAL
  Filled 2013-02-12 (×8): qty 1

## 2013-02-12 MED ORDER — DILTIAZEM HCL 100 MG IV SOLR
5.0000 mg/h | INTRAVENOUS | Status: DC
  Administered 2013-02-12: 10 mg/h via INTRAVENOUS
  Administered 2013-02-12: 5 mg/h via INTRAVENOUS
  Administered 2013-02-12: 10 mg/h via INTRAVENOUS
  Filled 2013-02-12 (×2): qty 100

## 2013-02-12 MED ORDER — SODIUM CHLORIDE 0.9 % IJ SOLN
3.0000 mL | Freq: Two times a day (BID) | INTRAMUSCULAR | Status: DC
  Administered 2013-02-12 – 2013-02-18 (×12): 3 mL via INTRAVENOUS

## 2013-02-12 NOTE — Progress Notes (Signed)
Patient HR sustaining in 150's.  Cardizem increased to 10mg /hr.  RN will continue to monitor. Louretta Parma, RN

## 2013-02-12 NOTE — Progress Notes (Signed)
MD notified about Cardizem drip. Drip has been d/c'd. New orders given. Will make receiving RN aware.

## 2013-02-12 NOTE — Progress Notes (Signed)
Patient converted to NSR and HR is down in the 70s. MD made aware. Receiving RN is aware. EKG will be obtained to confirm. Will continue to monitor patient.

## 2013-02-12 NOTE — Progress Notes (Signed)
Patient converted from NSR to AFIB with RVR.  MD Chase Picket notified of change. Awaiting new orders.  RN will continue to monitor. Louretta Parma, RN

## 2013-02-12 NOTE — Assessment & Plan Note (Signed)
Stable

## 2013-02-12 NOTE — Progress Notes (Signed)
Patient's HR continues to increase as high as 181. Patient is asymptomatic with no complaints and no signs of distress. BP is 132/117. Re-check BP manually is 118/88. MD notified. New orders given. Will continue to monitor patient for further changes in condition.

## 2013-02-12 NOTE — Progress Notes (Signed)
Report given to receiving RN. Patient is stable. No signs or symptoms of distress or discomfort.  

## 2013-02-12 NOTE — Care Management Note (Addendum)
    Page 1 of 2   02/17/2013     9:08:41 AM   CARE MANAGEMENT NOTE 02/17/2013  Patient:  Leah Harper, Leah Harper   Account Number:  [id number]  Date Initiated:  02/12/2013  Documentation initiated by:  Tera Mater  Subjective/Objective Assessment:   79yo female admitted with SOB.  Pt. lives at home with spouse.     Action/Plan:   discharge planning   Anticipated DC Date:  02/17/2013   Anticipated DC Plan:  HOME W HOME HEALTH SERVICES      DC Planning Services  CM consult  Medication Assistance      [facility] Choice  HOME HEALTH   Choice offered to / List presented to:  C-1 Patient        HH arranged  HH-1 RN  HH-2 PT      [facility] agency  [facility]   Status of service:  Completed, signed off Medicare Important Message given?   (If response is "NO", the following Medicare IM given date fields will be blank) Date Medicare IM given:   Date Additional Medicare IM given:    Discharge Disposition:  HOME W HOME HEALTH SERVICES  Per UR Regulation:  Reviewed for med. necessity/level of care/duration of stay  If discussed at Long Length of Stay Meetings, dates discussed:    Comments:  8/25  0907 debbie Rolin Schult rn,bsn spoke w pt and husband, hx of [facility] and would like to use them again for hhrn and pt. ref to donna w [facility]. pt for disch today.  8/21  0934 debbie Wilkes Potvin rn,bsn will give pt 30day free eliquis card.  02/12/13 1630 Noted CM referral for Eliquis assistance. Benefits check completed from insurance, and pt. copay will be $45/30day supply.  This medication does need a Prior Authorization.  Physician please call [phone] to authorize medication for pt.   NCM will continue to monitor for dc needs. Tera Mater, RN, BSN NCM [phone]

## 2013-02-12 NOTE — Assessment & Plan Note (Signed)
Patient comes in today and is in rapid atrial fibrillation at a rate of 160 beats per minute. Patient has symptoms of heart failure. She also has mild ST elevation in aVR. Patient was seen and examined by Dr. Sharrell Ku. Will admit her to the hospital for IV Cardizem, rule out for an MI, and diurese. We will start her on Eliquis 2.5mg  BID. She does have history of bradycardia on beta blocker in the past so we will have to monitor her closely.

## 2013-02-12 NOTE — Assessment & Plan Note (Signed)
Recent 2-D echo showed normal LV function a grade 2 diastolic dysfunction. Will diurese

## 2013-02-12 NOTE — Progress Notes (Signed)
Utilization Review Completed.Leah Harper T8/20/2014  

## 2013-02-12 NOTE — Patient Instructions (Addendum)
Pt admitted to the hospital. Location is [facility] of the hospital for rapid heart rate and chf. She will be telemitray with the start of I.V lasix and Dr. Sharrell Ku will be the Provider.

## 2013-02-12 NOTE — Progress Notes (Signed)
HPI:  This is a 79 year old female patient who has history of hypertension, hypothyroidism, chronic kidney disease and a history of a nonischemic nuclear study in 2003. She had an echo in January 2013 show mild LVH, EF 60%, aortic sclerosis without stenosis, mild AI, question of RV dysfunction, trivial pericardial effusion. She recently was seen by Alma Downs. Lendell Caprice nurse practitioner for 13 pound weight gain and was treated with IV Lasix for her heart failure. Her beta blocker was stopped  for bradycardia.  Today with ongoing dyspnea on exertion, edema that worsens in the evening and is in rapid atrial fibrillation which is new for her. The patient has dementia and denies any palpitations. Her husband says she's been short of breath for a few weeks now. Her weight is up about 10 pounds.   Allergies: -- Statins    --  Myalgia  -- Tylenol [Acetaminophen]    --  Facial rash  Current Outpatient Prescriptions on File Prior to Visit: ALPRAZolam (XANAX) 0.25 MG tablet, Take 1 tablet (0.25 mg total) by mouth 2 (two) times daily as needed. For anxiety, Disp: 30 tablet, Rfl: 0 aspirin 81 MG tablet, Take 81 mg by mouth daily.  , Disp: , Rfl:  ENSURE (ENSURE), Take 237 mLs by mouth 2 (two) times daily between meals. , Disp: , Rfl:  ezetimibe (ZETIA) 10 MG tablet, Take 1 tablet (10 mg total) by mouth daily., Disp: 90 tablet, Rfl: 1 furosemide (LASIX) 20 MG tablet, Take 1 tablet (20 mg total) by mouth daily., Disp: 30 tablet, Rfl: 0 omeprazole (PRILOSEC) 20 MG capsule, Take 20 mg by mouth daily., Disp: , Rfl:  rivastigmine (EXELON) 4.6 mg/24hr, Place 1 patch onto the skin at bedtime., Disp: , Rfl:   No current facility-administered medications on file prior to visit.   Past Medical History:   Hyperlipidemia                                               Hypertension                                                 AF (atrial fibrillation)                                     Hyperthyroidism                                                 Comment:remotely in 1941   Normocytic anemia                                              Comment:2002   S/P TAH-BSO (total abdominal hysterectomy and *              Allergy  Arrhythmia                                                   Anxiety                                                     Past Surgical History:   TONSILLECTOMY                                                 TOTAL ABDOMINAL HYSTERECTOMY W/ BILATERAL SALP*  1986         BILATERAL SALPINGOOPHORECTOMY                                 BLADDER SUSPENSION                                              Comment:bladder tack with TAHBSO  Review of patient's family history indicates:   Heart disease                  Father                   Hyperlipidemia                 Father                   Heart attack                   Father                   Heart disease                  Brother                    Comment: heart attack   Heart disease                  Brother                  Social History   Marital Status: Married             Spouse Name: [name]              Years of Education:                 Number of children: 3           Occupational History Occupation          Associate Professor            Comment              retired  Social History Main Topics   Smoking Status: Former Smoker                   Packs/Day: 0.00  Years:           Quit date: 07/17/1963   Smokeless Status: Never Used                       Alcohol Use: No             Drug Use: No             Sexual Activity: Not on file        Other Topics            Concern   None on file  Social History Narrative   Retired   Married -3 children    Tobacco abuse-no   Alcohol use-no     Regular exercise-no    ROS: See HPI Eyes: wears glasses Ears:Positive for hearing loss Cardiovascular: Negative for chest pain,  palpitations,irregular heartbeat, dyspnea, dyspnea on exertion, near-syncope, orthopnea, paroxysmal nocturnal dyspnea and syncope,edema, claudication, cyanosis,.  Respiratory:   Positive for shortness of breath Negative for cough, hemoptysis, sleep disturbances due to breathing, sputum production and wheezing.   Endocrine: Negative for cold intolerance and heat intolerance.  Hematologic/Lymphatic: Negative for adenopathy and bleeding problem. Does not bruise/bleed easily.  Musculoskeletal: Negative.   Gastrointestinal: Negative for nausea, vomiting, reflux, abdominal pain, diarrhea, constipation.   Neurological: Negative.  Allergic/Immunologic: Negative for environmental allergies.   PHYSICAL EXAM: Elderly, short of breath in atrial fibrillation. Neck: No JVD, HJR, Bruit, or thyroid enlargement  Lungs: No tachypnea, clear without wheezing, rales, or rhonchi   :Cardiovascular: Irregular irregular at 160 beats per minute distant heart sounds, no murmurs, gallops, bruit, thrill, or heave.  Abdomen: BS normal. Soft without organomegaly, masses, lesions or tenderness.  Extremities: Trace of ankle edema otherwise lower extremities without cyanosis, clubbing. Good distal pulses bilateral  SKin: Warm, no lesions or rashes   Musculoskeletal: No deformities  Neuro: Dementia  BP 128/94  Pulse 168  Ht 5\' 5"  (1.651 m)  Wt 109 lb 1.9 oz (49.497 kg)  BMI 18.16 kg/m2  SpO2 93%   EKG: Atrial fibrillation I 160 beats per minute, ST elevation aVR

## 2013-02-12 NOTE — Progress Notes (Signed)
Patient c/o of being anxious and unable to sleep.  Xanax given x1 per MAR.  Patient resting comfortably in bed in no acute distress. RN will continue to monitor. Louretta Parma, RN

## 2013-02-12 NOTE — Assessment & Plan Note (Signed)
Follow closely on diuretics

## 2013-02-13 ENCOUNTER — Inpatient Hospital Stay (HOSPITAL_COMMUNITY): Payer: Medicare Other

## 2013-02-13 DIAGNOSIS — I4891 Unspecified atrial fibrillation: Principal | ICD-10-CM

## 2013-02-13 LAB — BASIC METABOLIC PANEL
CO2: 24 mEq/L (ref 19–32)
Calcium: 9.3 mg/dL (ref 8.4–10.5)
Glucose, Bld: 102 mg/dL — ABNORMAL HIGH (ref 70–99)
Sodium: 138 mEq/L (ref 135–145)

## 2013-02-13 LAB — GLUCOSE, CAPILLARY

## 2013-02-13 MED ORDER — METOPROLOL TARTRATE 1 MG/ML IV SOLN
INTRAVENOUS | Status: AC
  Filled 2013-02-13: qty 5

## 2013-02-13 MED ORDER — POTASSIUM CHLORIDE CRYS ER 20 MEQ PO TBCR
40.0000 meq | EXTENDED_RELEASE_TABLET | Freq: Two times a day (BID) | ORAL | Status: AC
  Administered 2013-02-13 – 2013-02-14 (×4): 40 meq via ORAL
  Filled 2013-02-13 (×4): qty 2

## 2013-02-13 MED ORDER — ENSURE COMPLETE PO LIQD
237.0000 mL | Freq: Three times a day (TID) | ORAL | Status: DC
  Administered 2013-02-13 – 2013-02-18 (×11): 237 mL via ORAL

## 2013-02-13 MED ORDER — ADENOSINE 6 MG/2ML IV SOLN
INTRAVENOUS | Status: AC
  Filled 2013-02-13: qty 4

## 2013-02-13 MED ORDER — DILTIAZEM HCL 100 MG IV SOLR
5.0000 mg/h | INTRAVENOUS | Status: DC
  Filled 2013-02-13: qty 100

## 2013-02-13 MED ORDER — DILTIAZEM HCL 30 MG PO TABS
30.0000 mg | ORAL_TABLET | Freq: Four times a day (QID) | ORAL | Status: DC
  Administered 2013-02-13 – 2013-02-15 (×8): 30 mg via ORAL
  Filled 2013-02-13 (×12): qty 1

## 2013-02-13 MED ORDER — METOPROLOL TARTRATE 1 MG/ML IV SOLN
5.0000 mg | Freq: Once | INTRAVENOUS | Status: AC
  Administered 2013-02-13: 5 mg via INTRAVENOUS

## 2013-02-13 NOTE — Progress Notes (Signed)
INITIAL NUTRITION ASSESSMENT  DOCUMENTATION CODES Per approved criteria  -Underweight   INTERVENTION: 1.  Supplements; Ensure Complete po TID, each supplement provides 350 kcal and 13 grams of protein.   NUTRITION DIAGNOSIS: Unintended wt change related to recent medication changes as evidenced by pt with recent 5-7 lbs wt change.   Monitor:  1.  Food/Beverage; pt meeting >/=90% estimated needs with tolerance. 2.  Wt/wt change; monitor trends  Reason for Assessment: MST, BMI  79 y.o. female  Admitting Dx: dyspnea  ASSESSMENT: Pt admitted with dyspnea and report of worsening edema at night.  Pt was recently treated for edema at which time her wt had increased to 114 lbs.   Pt was recently on an appetite stimulant and she has seen an improvement in her wt from 93 to 110 lbs.   Nutrition Focused Physical Exam:  Subcutaneous Fat:  Orbital Region: WNL Upper Arm Region: WNL Thoracic and Lumbar Region: N/A  Muscle:  Temple Region: mild wasting Clavicle Bone Region: mild wasting Clavicle and Acromion Bone Region: N/A Scapular Bone Region: N/A Dorsal Hand: N/A Patellar Region: N/A Anterior Thigh Region: N/A Posterior Calf Region: N/A  Edema: none present  Family at bedside report improved intake with continued good appetite since stopping appetite stimulant 2 weeks ago.  Pt ate 100% of dinner last night and 75% of breakfast this AM.  She does drink Boost 2-3x/day at home.   Pt was recently determined to meet criteria for severe malnutrition in context of acute illness  (Jan 2014).  Since implementing appropriate interventions and starting appetite stimulant, pt has improved BMI from 15.7 to 17.8.  Pt continues to meet criteria for underweight, however has recently demonstrated good intake and repleted stores. No longer meets malnutrition criteria.    Height: Ht Readings from Last 1 Encounters:  02/12/13 5\' 5"  (1.651 m)    Weight: Wt Readings from Last 1 Encounters:   02/13/13 107 lb 2.3 oz (48.6 kg)    Ideal Body Weight: 125 lbs  % Ideal Body Weight: 85%  Wt Readings from Last 10 Encounters:  02/13/13 107 lb 2.3 oz (48.6 kg)  02/12/13 109 lb 1.9 oz (49.497 kg)  01/30/13 109 lb (49.442 kg)  01/28/13 109 lb (49.442 kg)  01/21/13 114 lb (51.71 kg)  12/24/12 101 lb (45.813 kg)  11/15/12 96 lb (43.545 kg)  11/01/12 92 lb 8 oz (41.958 kg)  10/04/12 86 lb (39.009 kg)  09/19/12 88 lb (39.917 kg)    Usual Body Weight: ~90 lbs  % Usual Body Weight: 118%  BMI:  Body mass index is 17.83 kg/(m^2).  Estimated Nutritional Needs: Kcal: 1450-1700 Protein: 75-85g Fluid: >1.5 L/day  Skin: intact  Diet Order: Cardiac  EDUCATION NEEDS: -Education needs addressed   Intake/Output Summary (Last 24 hours) at 02/13/13 1147 Last data filed at 02/13/13 0015  Gross per 24 hour  Intake    363 ml  Output   1100 ml  Net   -737 ml    Last BM: 8/19  Labs:   Recent Labs Lab 02/12/13 1500 02/13/13 0737  NA 137 138  K 3.5 3.3*  CL 100 100  CO2 23 24  BUN 35* 37*  CREATININE 1.95* 2.15*  CALCIUM 9.2 9.3  MG 2.3  --   GLUCOSE 173* 102*    CBG (last 3)   Recent Labs  02/13/13 0413  GLUCAP 114*    Scheduled Meds: . apixaban  2.5 mg Oral BID  . diltiazem  30 mg Oral  Q6H  . ezetimibe  10 mg Oral Daily  . furosemide  40 mg Intravenous BID  . pantoprazole  40 mg Oral Daily  . rivastigmine  4.6 mg Transdermal QHS  . sodium chloride  3 mL Intravenous Q12H    Continuous Infusions:   Past Medical History  Diagnosis Date  . Hyperlipidemia   . Hypertension   . AF (atrial fibrillation)   . Hyperthyroidism     remotely in 1941  . Normocytic anemia     2002  . S/P TAH-BSO (total abdominal hysterectomy and bilateral salpingo-oophorectomy)   . Allergy   . Arrhythmia   . Anxiety   . Dementia   . CHF (congestive heart failure)   . Kidney infection     history of   . GERD (gastroesophageal reflux disease)     Past Surgical  History  Procedure Laterality Date  . Tonsillectomy    . Total abdominal hysterectomy w/ bilateral salpingoophorectomy  1986  . Bilateral salpingoophorectomy    . Bladder suspension      bladder tack with TAHBSO    Loyce Dys, MS RD LDN Clinical Inpatient Dietitian Pager: [phone] Weekend/After hours pager: [phone]

## 2013-02-13 NOTE — Progress Notes (Signed)
Patient with HR sustaining in 140's.  Rapid response called and metoprolol given x1.  HR now sustaining 53.  RN will continue to monitor. Louretta Parma, RN

## 2013-02-13 NOTE — Clinical Documentation Improvement (Signed)
THIS DOCUMENT IS NOT A PERMANENT PART OF THE MEDICAL RECORD  Please update your documentation with the medical record to reflect your response to this query. If you need help knowing how to do this please call [phone].  02/13/13   Dr. Eden Emms,  In a better effort to capture your patient's severity of illness, reflect appropriate length of stay and utilization of resources, a review of the patient medical record has revealed the following information:   02/12/13  Office Note Known history of CKD     BUN/Cr/GFR from [facility] Results 09/2010 through 02/13/2013       (white female) Component     Latest Ref Rng 10/05/2010 03/11/2011 03/12/2011 07/18/2011  BUN     6 - 23 mg/dL 34 (H) 20 20 29  (H)  Creatinine     0.50 - 1.10 mg/dL 1.02 (H) 7.25 (H) 3.66 (H) 1.60 (H)  GFR calc non Af Amer     >90 mL/min  35 (L) 38 (L) 27 (L)   Component     Latest Ref Rng 07/19/2011 07/20/2011 09/02/2012 11/15/2012  BUN     6 - 23 mg/dL 24 (H) 16 32 (H) 29 (H)  Creatinine     0.50 - 1.10 mg/dL 4.40 (H) 3.47 (H) 4.25 (H) 1.57 (H)  GFR calc non Af Amer     >90 mL/min 26 (L) 23 (L)     Component     Latest Ref Rng 01/21/2013 01/28/2013 02/13/2013  BUN     6 - 23 mg/dL 48 (H) 54 (H) 37 (H)  Creatinine     0.50 - 1.10 mg/dL 9.56 (H) 3.87 (H) 5.64 (H)  GFR calc non Af Amer     >90 mL/min   19 (L)      BUN/Cr/GFR from CMP Results 09/2010 through 02/12/13       (whoite female) Component     Latest Ref Rng 10/01/2010 03/10/2011 07/17/2011 07/06/2012  BUN     6 - 23 mg/dL 38 (H) 22 31 (H) 34 (H)  Creatinine     0.50 - 1.10 mg/dL 2.2 (H) 3.32 (H) 9.51 (H) 1.60 (H)  GFR calc non Af Amer     >90 mL/min 21 (L) 34 (L) 27 (L) 27 (L)   Component     Latest Ref Rng 09/12/2012 09/19/2012 02/12/2013  BUN     6 - 23 mg/dL 29 (H) 36 (H) 35 (H)  Creatinine     0.50 - 1.10 mg/dL 8.84 (H) 1.66 (H) 0.63 (H)  GFR calc non Af Amer     >90 mL/min 29 (L) 27 (L) 21 (L)     Based on your clinical judgment, please document the  STAGE of the patient's CKD:    - CKD Stage I -  GFR > OR = 90  - CKD Stage II - GFR 60-89  - CKD Stage III - GFR 30-59  - CKD Stage IV - GFR 15-29  - CKD Stage V - GFR < 15  - ESRD (End Stage Renal Disease)  - Other Condition  - Unable to Clinically Determine     In responding to this query please exercise your independent judgment.    The fact that a query is asked, does not imply that any particular answer is desired or expected.  You may use Possible, Probable, or Suspect with inpatient documentation.   Possible, Probable, or Suspected diagnoses MUST be documented as such at the time of discharge, unless confirmed or ruled out  during the hospitalization.  Reviewed:  no additional documentation provided   Thank You,  Jerral Ralph  Clinical Documentation Specialist: [phone] Health Information Management [place]

## 2013-02-13 NOTE — Progress Notes (Signed)
Patient had brief episode of unresponsiveness.  Rapid Response called.  HR sustaining in 80's.  Husband remains at bedside.  CBG = 114. RN will continue to monitor. Louretta Parma, RN

## 2013-02-13 NOTE — Progress Notes (Signed)
Pushed 5mg  Metoprolol IV per MD order.  Pt tolerated well, asymptomatic, HR afib 56-110 bpm with <3 seconds.  Called Dr. Ronney Asters to make aware/update, orders received, ditalizem decreased to 5mg /hr.  Will continue to monitor.

## 2013-02-13 NOTE — Progress Notes (Signed)
HR sustaining in 50's.  Rapid Response called and patient transferred to ICU care. RN gave report to receiving RN. Louretta Parma, RN

## 2013-02-13 NOTE — Progress Notes (Signed)
Cardizem drip stopped in transfer to ICU for a sustaining HR in 40's.

## 2013-02-13 NOTE — Progress Notes (Deleted)
Pushed 5mg  IV Metoprolol per MD order for Afib.  Pt tolerated well, asymptomatic, HR 56-110 bpm.  Pt having HR pauses < 3 seconds. Called Dr. Ronney Asters to make aware, orders received.  Will continue to monitor.

## 2013-02-13 NOTE — Progress Notes (Signed)
Patient ID: Leah Harper, female   DOB: February 23, 1920, 79 y.o.   MRN: [id number]    Subjective:  Denies SSCP, palpitations or Dyspnea   Objective:  Filed Vitals:   02/13/13 0515 02/13/13 0700 02/13/13 0739 02/13/13 0800  BP: 126/35 128/87  130/66  Pulse: 107 48  92  Temp: 98 F (36.7 C)  98.2 F (36.8 C)   TempSrc: Oral  Oral   Resp: 20 17  17   Height:      Weight:      SpO2: 98% 100%  99%    Intake/Output from previous day:  Intake/Output Summary (Last 24 hours) at 02/13/13 0843 Last data filed at 02/13/13 0015  Gross per 24 hour  Intake    363 ml  Output   1100 ml  Net   -737 ml    Physical Exam: Affect appropriate Elderly frail female HEENT: normal Neck supple with no adenopathy JVP normal no bruits no thyromegaly Lungs clear with no wheezing and good diaphragmatic motion Heart:  S1/S2 SEM murmur, no rub, gallop or click PMI normal Abdomen: benighn, BS positve, no tenderness, no AAA no bruit.  No HSM or HJR Distal pulses intact with no bruits No edema Neuro non-focal Skin warm and dry No muscular weakness   Lab Results: Basic Metabolic Panel:  Recent Labs  19/14/78 1500  NA 137  K 3.5  CL 100  CO2 23  GLUCOSE 173*  BUN 35*  CREATININE 1.95*  CALCIUM 9.2  MG 2.3   Liver Function Tests:  Recent Labs  02/12/13 1500  AST 31  ALT 18  ALKPHOS 73  BILITOT 0.5  PROT 7.0  ALBUMIN 3.0*   CBC:  Recent Labs  02/12/13 1500  WBC 6.4  NEUTROABS 3.8  HGB 12.9  HCT 36.0  MCV 94.7  PLT 250   Cardiac Enzymes:  Recent Labs  02/12/13 1500 02/12/13 1855 02/13/13 0025  TROPONINI <0.30 <0.30 <0.30   Thyroid Function Tests:  Recent Labs  02/12/13 1500  TSH 1.888    Imaging: Dg Chest Port 1 View  02/13/2013   *RADIOLOGY REPORT*  Clinical Data: Congestive heart failure  PORTABLE CHEST - 1 VIEW  Comparison: January 28, 2013  Findings: There is no edema or consolidation.  Heart is slightly prominent in size, stable.  Pulmonary  vascularity reflects underlying emphysematous change.  No adenopathy.  There is atherosclerotic change in the aorta.  There is bilateral apical pleural thickening which is stable.  No pneumothorax.  IMPRESSION: Underlying emphysematous change.  No edema or consolidation.  Heart mildly prominent but stable.   Original Report Authenticated By: Bretta Bang, M.D.    Cardiac Studies:  ECG:  Afib rate 92 LBBB LVH LAD PVC;s    Telemetry: variable afib rates 40-150    Echo:   Medications:   . apixaban  2.5 mg Oral BID  . ezetimibe  10 mg Oral Daily  . furosemide  40 mg Intravenous BID  . pantoprazole  40 mg Oral Daily  . rivastigmine  4.6 mg Transdermal QHS  . sodium chloride  3 mL Intravenous Q12H     . diltiazem (CARDIZEM) infusion Stopped (02/13/13 0459)    Assessment/Plan:  Afib:  Rate control may be difficult due to propensity for bradycardia Would like to avoid pacer  Continue apixaban and change to PO cardizem 30 q 6  Echo pending TSH normal  Chol:  On zetia not clear of indication in 79 yo   Charlton Haws 02/13/2013, 8:43 AM

## 2013-02-13 NOTE — Significant Event (Signed)
Rapid Response Event Note  Overview: Time Called: 0255 Arrival Time: 0300 Event Type: Cardiac  Initial Focused Assessment: Called by RN Morrie Sheldon, to assess pt with tachycardiac 140's. Pt on cardizem gtt at 10mg .  126/86 18  95% 2 L [place]  97.9  Prepared for adenosine administration but pt converted to afib with rate 115 prior to med given .  NO adenosine given.  Pt later became hypotensive and bradycardic and was transferred to 2H28 for management of dysthymia and hypotension   Interventions: increased Cardizem to 15 mg  IV,  Gave Metoprolol 5 mg IV per Dr Orson Slick at bedside.  Transferred to 8G95 ICU.  Hand off report given  to Morrie Sheldon, RN   Event Summary: Name of Physician Notified: DR Orson Slick at 0300    at    Outcome: Transferred (Comment)  Event End Time: 0500  Jules Schick

## 2013-02-14 DIAGNOSIS — I379 Nonrheumatic pulmonary valve disorder, unspecified: Secondary | ICD-10-CM

## 2013-02-14 LAB — BASIC METABOLIC PANEL
BUN: 48 mg/dL — ABNORMAL HIGH (ref 6–23)
Chloride: 98 mEq/L (ref 96–112)
GFR calc Af Amer: 19 mL/min — ABNORMAL LOW (ref >90)
Potassium: 4.3 mEq/L (ref 3.5–5.1)
Sodium: 135 mEq/L (ref 135–145)

## 2013-02-14 NOTE — Clinical Documentation Improvement (Signed)
THIS DOCUMENT IS NOT A PERMANENT PART OF THE MEDICAL RECORD  Please update your documentation with the medical record to reflect your response to this query. If you need help knowing how to do this please call [phone].  02/14/13  Dr. Jens Som,  In a better effort to capture your patient's severity of illness, reflect appropriate length of stay and utilization of resources, a review of the patient medical record has revealed the following information:  "Symptoms of Heart Failure" per Office Note 02/12/13 by Jacolyn Reedy  "Weight up 10 pounds" per Office Note 02/12/13 by Jacolyn Reedy   - Lasix 40 mg IV given on admission 02/12/13 at 1414 per MAR Review   - 02/12/13 - 1100 mls of urine output per [facility] Review   - 02/13/13 - 1445 mls of urine output per [facility] Review   - Repeat Echo pending for this admission 02/14/13  (yes, I realize this.)   - 07/18/11  Echo   EF 60%, Mild LVH, Mild Septal Dysynergy per [facility] Review    Based on your clinical judgment, please document the ACUITY and TYPE of Heart Failure treated this admission in the progress notes and discharge summary:   ACUITY:  - Acute   - Chronic  - Acute on Chronic   AND  TYPE:  - Systolic  - Diastolic  - Combined    In responding to this query please exercise your independent judgment.    The fact that a query is asked, does not imply that any particular answer is desired or expected.     Reviewed:  no additional documentation provided; patient not in CHF since I have rounded on the patient.  Thank You,  Jerral Ralph  RN BSN CCDS Certified Clinical Documentation Specialist: [phone] Health Information Management [place]

## 2013-02-14 NOTE — Progress Notes (Signed)
*  PRELIMINARY RESULTS* Echocardiogram 2D Echocardiogram has been performed.  Leah Harper 02/14/2013, 12:35 PM 

## 2013-02-14 NOTE — Progress Notes (Signed)
Patient ID: Leah Harper, female   DOB: Sep 06, 1919, 79 y.o.   MRN: [id number]    Subjective:  Denies SSCP, palpitations or Dyspnea   Objective:  Filed Vitals:   02/13/13 2000 02/13/13 2332 02/14/13 0411 02/14/13 0500  BP: 129/38 117/84 132/61   Pulse: 51 139 34   Temp: 98 F (36.7 C) 99.2 F (37.3 C) 98.8 F (37.1 C)   TempSrc: Oral Oral Oral   Resp: 23 21 20    Height:      Weight:    109 lb 5.6 oz (49.6 kg)  SpO2: 98% 98% 96%     Intake/Output from previous day:  Intake/Output Summary (Last 24 hours) at 02/14/13 7829 Last data filed at 02/14/13 0100  Gross per 24 hour  Intake    600 ml  Output   1445 ml  Net   -845 ml    Physical Exam: Affect appropriate Elderly frail female HEENT: normal Neck supple with no adenopathy JVP normal no bruits no thyromegaly Lungs clear with no wheezing and good diaphragmatic motion Heart:  S1/S2 SEM murmur, no rub, gallop or click PMI normal Abdomen: benighn, BS positve, no tenderness, no AAA no bruit.  No HSM or HJR Distal pulses intact with no bruits No edema Neuro non-focal Skin warm and dry No muscular weakness   Lab Results: Basic Metabolic Panel:  Recent Labs  56/21/30 1500 02/13/13 0737 02/14/13 0518  NA 137 138 135  K 3.5 3.3* 4.3  CL 100 100 98  CO2 23 24 25   GLUCOSE 173* 102* 115*  BUN 35* 37* 48*  CREATININE 1.95* 2.15* 2.43*  CALCIUM 9.2 9.3 9.4  MG 2.3  --   --    Liver Function Tests:  Recent Labs  02/12/13 1500  AST 31  ALT 18  ALKPHOS 73  BILITOT 0.5  PROT 7.0  ALBUMIN 3.0*   CBC:  Recent Labs  02/12/13 1500  WBC 6.4  NEUTROABS 3.8  HGB 12.9  HCT 36.0  MCV 94.7  PLT 250   Cardiac Enzymes:  Recent Labs  02/12/13 1500 02/12/13 1855 02/13/13 0025  TROPONINI <0.30 <0.30 <0.30   Thyroid Function Tests:  Recent Labs  02/12/13 1500  TSH 1.888    Imaging: Dg Chest Port 1 View  02/13/2013   *RADIOLOGY REPORT*  Clinical Data: Congestive heart failure  PORTABLE  CHEST - 1 VIEW  Comparison: January 28, 2013  Findings: There is no edema or consolidation.  Heart is slightly prominent in size, stable.  Pulmonary vascularity reflects underlying emphysematous change.  No adenopathy.  There is atherosclerotic change in the aorta.  There is bilateral apical pleural thickening which is stable.  No pneumothorax.  IMPRESSION: Underlying emphysematous change.  No edema or consolidation.  Heart mildly prominent but stable.   Original Report Authenticated By: Bretta Bang, M.D.    Cardiac Studies:  ECG:  Afib rate 92 LBBB LVH LAD PVC;s    Telemetry: variable afib rates 80-130 improved  Echo:   Medications:   . apixaban  2.5 mg Oral BID  . diltiazem  30 mg Oral Q6H  . ezetimibe  10 mg Oral Daily  . feeding supplement  237 mL Oral TID BM  . furosemide  40 mg Intravenous BID  . pantoprazole  40 mg Oral Daily  . potassium chloride  40 mEq Oral BID  . rivastigmine  4.6 mg Transdermal QHS  . sodium chloride  3 mL Intravenous Q12H        Assessment/Plan:  Afib:  Rate control may be difficult due to propensity for bradycardia Would like to avoid pacer  Continue apixaban and PO cardizem 30 q 6  Echo canceled?  Will reorder EF normal in 2013 with no bad valve disease TSH normal  Chol:  On zetia not clear of indication in 79 yo Pulm:  CXR with emphysema.  sats ok on room air no active wheezing  Ambulate  Check echo D/C over weekend likely Sunday  Can increase cardizem to 60 q6 if needed but would try to avoid multiple agents.  Will see in office and consider DCC in 3-4 weeks.  Discussed care plan with daughter   Charlton Haws 02/14/2013, 8:06 AM

## 2013-02-15 LAB — BASIC METABOLIC PANEL
CO2: 25 mEq/L (ref 19–32)
Chloride: 100 mEq/L (ref 96–112)
Potassium: 5.1 mEq/L (ref 3.5–5.1)
Sodium: 134 mEq/L — ABNORMAL LOW (ref 135–145)

## 2013-02-15 MED ORDER — DILTIAZEM HCL 60 MG PO TABS
60.0000 mg | ORAL_TABLET | Freq: Three times a day (TID) | ORAL | Status: DC
  Administered 2013-02-15 – 2013-02-16 (×3): 60 mg via ORAL
  Filled 2013-02-15 (×6): qty 1

## 2013-02-15 NOTE — Evaluation (Signed)
Physical Therapy Evaluation Patient Details Name: Leah Harper MRN: [id number] DOB: Aug 17, 1919 Today's Date: 02/15/2013 Time: 1210-1230 PT Time Calculation (min): 20 min  PT Assessment / Plan / Recommendation History of Present Illness  This is a 79 year old female patient who has history of hypertension, hypothyroidism, chronic kidney disease and a history of a nonischemic nuclear study in 2003. She had an echo in January 2013 show mild LVH, EF 60%, aortic sclerosis without stenosis, mild AI, question of RV dysfunction, trivial pericardial effusion. She recently was seen by Leah Harper. Leah Harper nurse practitioner for 13 pound weight gain and was treated with IV Lasix for her heart failure. Her beta blocker was stopped  for bradycardia.  Pt with hx of dementia and currently in afib with bradycardia.    Clinical Impression  Pt admitted with afib and attempting to manage through medication. Pt currently with functional limitations due to the deficits listed below (see PT Problem List).  Pt will benefit from skilled PT to increase their independence and safety with mobility to allow discharge to the venue listed below.      PT Assessment  Patient needs continued PT services    Follow Up Recommendations  Home health PT;Supervision/Assistance - 24 hour    Equipment Recommendations  None recommended by PT    Frequency Min 3X/week    Precautions / Restrictions Precautions Precautions: Fall Restrictions Weight Bearing Restrictions: No   Pertinent Vitals/Pain No c/o pain; Pt's SaO2 decreased to 88-90% on RA with ambulation however quickly returned to 95% on RA at rest.        Mobility  Bed Mobility Bed Mobility: Supine to Sit Supine to Sit: 4: Min assist Details for Bed Mobility Assistance: (A) with trunk OOB with cues for technique Transfers Transfers: Sit to Stand;Stand to Sit Sit to Stand: 4: Min assist;From bed Stand to Sit: 4: Min assist;To chair/3-in-1 Details for Transfer  Assistance: (A) to initiate transfer with cues for hand placement Ambulation/Gait Ambulation/Gait Assistance: 4: Min assist Ambulation Distance (Feet): 20 Feet Assistive device: 1 person hand held assist Ambulation/Gait Assistance Details: (A) to maintain balance with cues for upright posture.  Attempted to ambulate with RW due to overall fatigue but pt having difficulty navigating with RW.  Gait Pattern: Step-through pattern;Decreased stride length;Shuffle Gait velocity: decreased Stairs: No     PT Diagnosis: Difficulty walking;Generalized weakness  PT Problem List: Decreased strength;Decreased activity tolerance;Decreased balance;Decreased mobility;Decreased knowledge of use of DME;Cardiopulmonary status limiting activity PT Treatment Interventions: DME instruction;Gait training;Functional mobility training;Therapeutic activities;Therapeutic exercise;Balance training;Patient/family education;Stair training     PT Goals(Current goals can be found in the care plan section) Acute Rehab PT Goals Patient Stated Goal: Did not set PT Goal Formulation: With family Time For Goal Achievement: 02/22/13 Potential to Achieve Goals: Good  Visit Information  Last PT Received On: 02/15/13 Assistance Needed: +1 History of Present Illness: This is a 79 year old female patient who has history of hypertension, hypothyroidism, chronic kidney disease and a history of a nonischemic nuclear study in 2003. She had an echo in January 2013 show mild LVH, EF 60%, aortic sclerosis without stenosis, mild AI, question of RV dysfunction, trivial pericardial effusion. She recently was seen by Leah Harper. Leah Harper nurse practitioner for 13 pound weight gain and was treated with IV Lasix for her heart failure. Her beta blocker was stopped  for bradycardia.  Pt with hx of dementia and currently in afib with bradycardia.         Prior Functioning  Home Living Family/patient  expects to be discharged to:: Private  residence Living Arrangements: Spouse/significant other Available Help at Discharge: Family Type of Home: Other(Comment) (townhouse) Home Access: Stairs to enter Secretary/administrator of Steps: 4 Entrance Stairs-Rails: Right;Left;Can reach both Home Layout: Two level;Able to live on main level with bedroom/bathroom Home Equipment: Dan Humphreys - 2 wheels;Cane - single point Prior Function Level of Independence: Independent Communication Communication: No difficulties Dominant Hand: Right    Cognition  Cognition Arousal/Alertness: Awake/alert Behavior During Therapy: WFL for tasks assessed/performed Overall Cognitive Status: History of cognitive impairments - at baseline (pt with dementia)    Extremity/Trunk Assessment Lower Extremity Assessment Lower Extremity Assessment: Generalized weakness Cervical / Trunk Assessment Cervical / Trunk Assessment: Kyphotic   Balance Balance Balance Assessed: Yes Static Sitting Balance Static Sitting - Balance Support: Feet supported Static Sitting - Level of Assistance: 7: Independent  End of Session PT - End of Session Equipment Utilized During Treatment: Gait belt Activity Tolerance: Patient tolerated treatment well Patient left: in chair;with call bell/phone within reach;with family/visitor present Nurse Communication: Mobility status  GP     Leah Harper 02/15/2013, 2:53 PM  Leah Harper, PT DPT [phone]

## 2013-02-15 NOTE — Progress Notes (Signed)
  Patient ID: Leah Harper, female   DOB: Dec 14, 1919, 79 y.o.   MRN: [id number]    Subjective:  Denies dyspnea or chest pain   Objective:  Filed Vitals:   02/15/13 0326 02/15/13 0329 02/15/13 0814 02/15/13 0815  BP: 141/90  146/50 146/50  Pulse: 66   138  Temp: 98.3 F (36.8 C)  98.7 F (37.1 C)   TempSrc: Oral  Oral   Resp: 22  17 18   Height:      Weight:  111 lb 15.9 oz (50.8 kg)    SpO2: 95%  96% 96%    Intake/Output from previous day:  Intake/Output Summary (Last 24 hours) at 02/15/13 1026 Last data filed at 02/15/13 0800  Gross per 24 hour  Intake    720 ml  Output   1050 ml  Net   -330 ml    Physical Exam: Elderly frail female HEENT: normal Neck supple  Lungs CTA Heart:  Irregular Abdomen: soft, NT/ND Ext No edema Neuro grossly non-focal Skin warm and dry   Lab Results: Basic Metabolic Panel:  Recent Labs  40/98/11 1500  02/14/13 0518 02/15/13 0510  NA 137  < > 135 134*  K 3.5  < > 4.3 5.1  CL 100  < > 98 100  CO2 23  < > 25 25  GLUCOSE 173*  < > 115* 106*  BUN 35*  < > 48* 54*  CREATININE 1.95*  < > 2.43* 2.38*  CALCIUM 9.2  < > 9.4 9.5  MG 2.3  --   --   --   < > = values in this interval not displayed. Liver Function Tests:  Recent Labs  02/12/13 1500  AST 31  ALT 18  ALKPHOS 73  BILITOT 0.5  PROT 7.0  ALBUMIN 3.0*   CBC:  Recent Labs  02/12/13 1500  WBC 6.4  NEUTROABS 3.8  HGB 12.9  HCT 36.0  MCV 94.7  PLT 250   Cardiac Enzymes:  Recent Labs  02/12/13 1500 02/12/13 1855 02/13/13 0025  TROPONINI <0.30 <0.30 <0.30   Thyroid Function Tests:  Recent Labs  02/12/13 1500  TSH 1.888     Medications:   . apixaban  2.5 mg Oral BID  . diltiazem  30 mg Oral Q6H  . ezetimibe  10 mg Oral Daily  . feeding supplement  237 mL Oral TID BM  . pantoprazole  40 mg Oral Daily  . rivastigmine  4.6 mg Transdermal QHS  . sodium chloride  3 mL Intravenous Q12H       Telemetry - atrial flutter; HR mildly  elevated.  Assessment/Plan:  Afib/flutter:  Rate control may be difficult due to propensity for bradycardia. Would like to avoid pacer.  Continue apixaban and PO cardizem; given mildly elevated rate, will change cardizem to 60 Q 8; convert to CD in AM if no bradycardia.  Echo shows EF 50. TSH normal.  Pulm:  CXR with emphysema.  sats ok on room air no active wheezing.  Acute on chronic renal failure: recheck BMET in AM.  Ambulate. Dr Eden Emms plans to see patient back in office and consider DCCV.   Olga Millers 02/15/2013, 10:26 AM

## 2013-02-16 LAB — BASIC METABOLIC PANEL
CO2: 23 mEq/L (ref 19–32)
Chloride: 99 mEq/L (ref 96–112)
Glucose, Bld: 99 mg/dL (ref 70–99)
Potassium: 4.4 mEq/L (ref 3.5–5.1)
Sodium: 133 mEq/L — ABNORMAL LOW (ref 135–145)

## 2013-02-16 LAB — CBC
Hemoglobin: 12.1 g/dL (ref 12.0–15.0)
MCH: 33.2 pg (ref 26.0–34.0)
MCV: 94.8 fL (ref 78.0–100.0)
RBC: 3.64 MIL/uL — ABNORMAL LOW (ref 3.87–5.11)
WBC: 6.9 10*3/uL (ref 4.0–10.5)

## 2013-02-16 MED ORDER — DILTIAZEM HCL 60 MG PO TABS
60.0000 mg | ORAL_TABLET | Freq: Three times a day (TID) | ORAL | Status: DC
  Administered 2013-02-16 (×2): 60 mg via ORAL
  Filled 2013-02-16 (×5): qty 1

## 2013-02-16 MED ORDER — DILTIAZEM HCL ER COATED BEADS 180 MG PO CP24
180.0000 mg | ORAL_CAPSULE | Freq: Every day | ORAL | Status: DC
  Filled 2013-02-16: qty 1

## 2013-02-16 NOTE — Progress Notes (Addendum)
  Patient ID: Leah Harper, female   DOB: August 31, 1919, 79 y.o.   MRN: [id number]    Subjective:  Denies dyspnea or chest pain   Objective:  Filed Vitals:   02/16/13 0000 02/16/13 0354 02/16/13 0500 02/16/13 0714  BP: 143/42 159/44  122/42  Pulse:    69  Temp: 97.8 F (36.6 C) 98.1 F (36.7 C)  98.3 F (36.8 C)  TempSrc: Oral Oral  Oral  Resp: 19 17  18   Height:      Weight:   110 lb 14.3 oz (50.3 kg)   SpO2: 98% 95%  93%    Intake/Output from previous day:  Intake/Output Summary (Last 24 hours) at 02/16/13 0924 Last data filed at 02/16/13 0900  Gross per 24 hour  Intake    960 ml  Output   1050 ml  Net    -90 ml    Physical Exam: Elderly frail female HEENT: normal Neck supple  Lungs CTA Heart:  Irregular Abdomen: soft, NT/ND Ext No edema Neuro grossly non-focal Skin warm and dry   Lab Results: Basic Metabolic Panel:  Recent Labs  57/84/69 0510 02/16/13 0548  NA 134* 133*  K 5.1 4.4  CL 100 99  CO2 25 23  GLUCOSE 106* 99  BUN 54* 44*  CREATININE 2.38* 2.13*  CALCIUM 9.5 9.3   Liver Function Tests: No results found for this basename: AST, ALT, ALKPHOS, BILITOT, PROT, ALBUMIN,  in the last 72 hours CBC:  Recent Labs  02/16/13 0548  WBC 6.9  HGB 12.1  HCT 34.5*  MCV 94.8  PLT 269   Cardiac Enzymes: No results found for this basename: CKTOTAL, CKMB, CKMBINDEX, TROPONINI,  in the last 72 hours Thyroid Function Tests: No results found for this basename: TSH, T4TOTAL, FREET3, T3FREE, THYROIDAB,  in the last 72 hours   Medications:   . apixaban  2.5 mg Oral BID  . diltiazem  60 mg Oral Q8H  . ezetimibe  10 mg Oral Daily  . feeding supplement  237 mL Oral TID BM  . pantoprazole  40 mg Oral Daily  . rivastigmine  4.6 mg Transdermal QHS  . sodium chloride  3 mL Intravenous Q12H       Telemetry - atrial flutter; HR controlled  Assessment/Plan:  Afib/flutter:  Rate control may be difficult due to propensity for bradycardia. Would like  to avoid pacer.  Continue apixaban and PO cardizem; HR has improved; change cardizem to CD and watch telemetry today; DC in AM if stable. Echo shows EF 50. TSH normal.  Pulm:  CXR with emphysema.  sats ok on room air no active wheezing.  Acute on chronic renal failure; BUN and CR improved today  Ambulate. Dr Eden Emms plans to see patient back in office following DC and consider DCCV.  Acute diastolic CHF-resolved Olga Millers 02/16/2013, 9:24 AM

## 2013-02-17 LAB — GLUCOSE, CAPILLARY

## 2013-02-17 MED ORDER — DILTIAZEM HCL 90 MG PO TABS
90.0000 mg | ORAL_TABLET | Freq: Three times a day (TID) | ORAL | Status: DC
  Administered 2013-02-18 (×2): 90 mg via ORAL
  Filled 2013-02-17 (×5): qty 1

## 2013-02-17 MED ORDER — DILTIAZEM HCL ER COATED BEADS 240 MG PO CP24
240.0000 mg | ORAL_CAPSULE | Freq: Every day | ORAL | Status: DC
  Administered 2013-02-17: 240 mg via ORAL
  Filled 2013-02-17: qty 1

## 2013-02-17 NOTE — H&P (Addendum)
Cardiology Attending  I have discussed the case with Leah Carson, PA-C. I have seen and examined the patient and discussed the need for admission with the patient and her husband. She is an elderly woman with new onset atrial fibrillation and a RVR with mild CHF on exam. She will be admitted to the hospital and her rate controlled with IV cardizem. IV lasix will be given as needed.  Leah Harper,M.D. HPI:  This is a 79 year old female patient who has history of hypertension, hypothyroidism, chronic kidney disease and a history of a nonischemic nuclear study in 2003. She had an echo in January 2013 show mild LVH, EF 60%, aortic sclerosis without stenosis, mild AI, question of RV dysfunction, trivial pericardial effusion. She recently was seen by Alma Downs. Lendell Caprice nurse practitioner for 13 pound weight gain and was treated with IV Lasix for her heart failure. Her beta blocker was stopped for bradycardia.  Today with ongoing dyspnea on exertion, edema that worsens in the evening and is in rapid atrial fibrillation which is new for her. The patient has dementia and denies any palpitations. Her husband says she's been short of breath for a few weeks now. Her weight is up about 10 pounds.  Allergies:  -- Statins  -- Myalgia  -- Tylenol [Acetaminophen]  -- Facial rash  Current Outpatient Prescriptions on File Prior to Visit:  ALPRAZolam (XANAX) 0.25 MG tablet, Take 1 tablet (0.25 mg total) by mouth 2 (two) times daily as needed. For anxiety, Disp: 30 tablet, Rfl: 0  aspirin 81 MG tablet, Take 81 mg by mouth daily. , Disp: , Rfl:  ENSURE (ENSURE), Take 237 mLs by mouth 2 (two) times daily between meals. , Disp: , Rfl:  ezetimibe (ZETIA) 10 MG tablet, Take 1 tablet (10 mg total) by mouth daily., Disp: 90 tablet, Rfl: 1  furosemide (LASIX) 20 MG tablet, Take 1 tablet (20 mg total) by mouth daily., Disp: 30 tablet, Rfl: 0  omeprazole (PRILOSEC) 20 MG capsule, Take 20 mg by mouth daily., Disp: , Rfl:   rivastigmine (EXELON) 4.6 mg/24hr, Place 1 patch onto the skin at bedtime., Disp: , Rfl:  No current facility-administered medications on file prior to visit.  Past Medical History:  Hyperlipidemia  Hypertension  AF (atrial fibrillation)  Hyperthyroidism  Comment:remotely in 1941  Normocytic anemia  Comment:2002  S/P TAH-BSO (total abdominal hysterectomy and *  Allergy  Arrhythmia  Anxiety  Past Surgical History:  TONSILLECTOMY  TOTAL ABDOMINAL HYSTERECTOMY W/ BILATERAL SALP* 1986  BILATERAL SALPINGOOPHORECTOMY  BLADDER SUSPENSION  Comment:bladder tack with TAHBSO  Review of patient's family history indicates:  Heart disease Father  Hyperlipidemia Father  Heart attack Father  Heart disease Brother  Comment: heart attack  Heart disease Brother  Social History  Marital Status: Married Spouse Name: Leah Harper  Years of Education: Number of children: 3  Occupational History  Occupation Associate Professor Comment  retired  Social History Main Topics  Smoking Status: Former Smoker Packs/Day: 0.00 Years:  Quit date: 07/17/1963  Smokeless Status: Never Used  Alcohol Use: No  Drug Use: No  Sexual Activity: Not on file  Other Topics Concern  None on file  Social History Narrative  Retired  Married -3 children  Tobacco abuse-no  Alcohol use-no  Regular exercise-no  ROS:  See HPI  Eyes: wears glasses  Ears:Positive for hearing loss  Cardiovascular: Negative for chest pain, palpitations,irregular heartbeat, dyspnea, dyspnea on exertion, near-syncope, orthopnea, paroxysmal nocturnal dyspnea and syncope,edema, claudication, cyanosis,.  Respiratory: Positive for shortness of breath  Negative for cough, hemoptysis, sleep disturbances due to breathing, sputum production and wheezing.  Endocrine: Negative for cold intolerance and heat intolerance.  Hematologic/Lymphatic: Negative for adenopathy and bleeding problem. Does not bruise/bleed easily.  Musculoskeletal: Negative.   Gastrointestinal: Negative for nausea, vomiting, reflux, abdominal pain, diarrhea, constipation.  Neurological: Negative.  Allergic/Immunologic: Negative for environmental allergies.  PHYSICAL EXAM: Elderly, short of breath in atrial fibrillation.  Neck: No JVD, HJR, Bruit, or thyroid enlargement  Lungs: No tachypnea, clear without wheezing, rales, or rhonchi  :Cardiovascular: Irregular irregular at 160 beats per minute distant heart sounds, no murmurs, gallops, bruit, thrill, or heave.  Abdomen: BS normal. Soft without organomegaly, masses, lesions or tenderness.  Extremities: Trace of ankle edema otherwise lower extremities without cyanosis, clubbing. Good distal pulses bilateral  SKin: Warm, no lesions or rashes  Musculoskeletal: No deformities  Neuro: Dementia  BP 128/94  Pulse 168  Ht 5\' 5"  (1.651 m)  Wt 109 lb 1.9 oz (49.497 kg)  BMI 18.16 kg/m2  SpO2 93%  EKG: Atrial fibrillation I 160 beats per minute, ST elevation aVR       Atrial fibrillation with rapid ventricular response - Dyann Kief, PA-C at 02/12/2013 10:12 AM    Status: Written Related Problem: Atrial fibrillation with rapid ventricular response         Patient comes in today and is in rapid atrial fibrillation at a rate of 160 beats per minute. Patient has symptoms of heart failure. She also has mild ST elevation in aVR. Patient was seen and examined by Dr. Sharrell Ku. Will admit her to the hospital for IV Cardizem, rule out for an MI, and diurese. We will start her on Eliquis 2.5mg  BID. She does have history of bradycardia on beta blocker in the past so we will have to monitor her closely.        CHF exacerbation - Dyann Kief, PA-C at 02/12/2013 10:13 AM    Status: Written Related Problem: CHF exacerbation         Recent 2-D echo showed normal LV function a grade 2 diastolic dysfunction. Will diurese        Chronic kidney disease (CKD), stage III (moderate) - Dyann Kief, PA-C at 02/12/2013 10:13 AM     Status: Written Related Problem: Chronic kidney disease (CKD), stage III (moderate)         Follow closely on diuretics        HYPERTENSION - Dyann Kief, PA-C at 02/12/2013 10:13 AM    Status: Written Related Problem: HYPERTENSION         Stable

## 2013-02-17 NOTE — Progress Notes (Addendum)
Informed by nurse that pt unable to swallow Cardizem CD.  Apparently this happened yesterday as well and for that reason she was switched back to a short-acting formulation.  Family would like her to remain on short-acting dilt. Will switch to 90mg  q8H.  Husband does not feel that he can take her home today.  Tx to tele.  Ambulate.  PT saw on 8/23 and rec HHPT with 24 hr supervision.  She lives with her elderly husband.

## 2013-02-17 NOTE — Progress Notes (Signed)
Pt ordered first dose of Cardizem 240mg . Pt given medication with ensure pudding, per husband's request. Pt then proceeded to chew up the capsule and hand the RN back the empty green capsule. EKG noted pt in and out of afib with a rate in the 90s. Ward Givens, NP informed and will review chart. Will continue to monitor.

## 2013-02-17 NOTE — Progress Notes (Signed)
Physical Therapy Treatment Patient Details Name: Leah Harper MRN: [id number] DOB: Dec 20, 1919 Today's Date: 02/17/2013 Time: 4403-4742 PT Time Calculation (min): 25 min  PT Assessment / Plan / Recommendation  History of Present Illness This is a 79 year old female patient who has history of hypertension, hypothyroidism, chronic kidney disease and a history of a nonischemic nuclear study in 2003. She had an echo in January 2013 show mild LVH, EF 60%, aortic sclerosis without stenosis, mild AI, question of RV dysfunction, trivial pericardial effusion. She recently was seen by Alma Downs. Lendell Caprice nurse practitioner for 13 pound weight gain and was treated with IV Lasix for her heart failure. Her beta blocker was stopped  for bradycardia.  Pt with hx of dementia and currently in afib with bradycardia.     PT Comments   Pt with request to use bathroom. Pt supervision with tolieting. Pt with improved ambulation tolerance however noted DOE. Pt to benefit from use of RW due significant increased falls risk without. Pt con't to be appropiate for HHPT and 24/7 superrvision for safe d/c home.  Follow Up Recommendations  Home health PT;Supervision/Assistance - 24 hour     Does the patient have the potential to tolerate intense rehabilitation     Barriers to Discharge        Equipment Recommendations  None recommended by PT    Recommendations for Other Services    Frequency Min 3X/week   Progress towards PT Goals Progress towards PT goals: Progressing toward goals  Plan Current plan remains appropriate    Precautions / Restrictions Precautions Precautions: Fall Restrictions Weight Bearing Restrictions: No   Pertinent Vitals/Pain HR 130 at end of ambulation,s/p 1 min HR 95    Mobility  Bed Mobility Bed Mobility: Supine to Sit Supine to Sit: 4: Min assist Details for Bed Mobility Assistance: (A) with trunk OOB with cues for technique Transfers Transfers: Sit to Stand;Stand to Sit Sit  to Stand: 4: Min assist;From bed Stand to Sit: 4: Min assist;To chair/3-in-1 Details for Transfer Assistance: v/c's sequence and safe hand placement,minA for anterior weight shift to initiate stand Ambulation/Gait Ambulation/Gait Assistance: 4: Min assist Ambulation Distance (Feet): 150 Feet Assistive device: Rolling walker Ambulation/Gait Assistance Details: minA for walker management and to stay in walker.pt very unsteady reaching for object to hold onto without RW Gait Pattern: Step-through pattern;Decreased stride length Gait velocity: slow Stairs: No    Exercises     PT Diagnosis:    PT Problem List:   PT Treatment Interventions:     PT Goals (current goals can now be found in the care plan section)    Visit Information  Last PT Received On: 02/17/13 Assistance Needed: +1 History of Present Illness: This is a 79 year old female patient who has history of hypertension, hypothyroidism, chronic kidney disease and a history of a nonischemic nuclear study in 2003. She had an echo in January 2013 show mild LVH, EF 60%, aortic sclerosis without stenosis, mild AI, question of RV dysfunction, trivial pericardial effusion. She recently was seen by Alma Downs. Lendell Caprice nurse practitioner for 13 pound weight gain and was treated with IV Lasix for her heart failure. Her beta blocker was stopped  for bradycardia.  Pt with hx of dementia and currently in afib with bradycardia.      Subjective Data  Subjective: Pt received asleep in bed, easily awoken.   Cognition  Cognition Arousal/Alertness: Awake/alert Behavior During Therapy: WFL for tasks assessed/performed Overall Cognitive Status: History of cognitive impairments - at baseline Memory:  Decreased short-term memory    Balance  Static Standing Balance Static Standing - Balance Support: No upper extremity supported Static Standing - Level of Assistance: 5: Stand by assistance Static Standing - Comment/# of Minutes: pt alble to wash hand  at sink  End of Session PT - End of Session Equipment Utilized During Treatment: Gait belt Activity Tolerance: Patient tolerated treatment well Patient left: in chair;with call bell/phone within reach;with family/visitor present Nurse Communication: Mobility status   GP     Marcene Brawn 02/17/2013, 3:10 PM  Lewis Shock, PT, DPT Pager #: [phone] Office #: [phone]

## 2013-02-17 NOTE — Progress Notes (Signed)
Patient in A fib per monitor in the 90s. Per RN note today pt has been in and out of A fib today. Will continue to monitor. Pt chewed 24hr capsul of 240mg  cardizem today, pt husband requesting that pt not take cardizem dose tonight. Per pharmacy, holding PM short acting dose of cardizem.  Baron Hamper, RN

## 2013-02-17 NOTE — Progress Notes (Signed)
Per monitor pt is in SR in 70s. Pt has been back and forth between A fib and SR. Baron Hamper, RN

## 2013-02-17 NOTE — Progress Notes (Signed)
  Patient ID: Leah Harper, female   DOB: 1920/04/16, 79 y.o.   MRN: [id number]    Subjective:  Denies dyspnea or chest pain Converted to NSR    Objective:  Filed Vitals:   02/16/13 2330 02/17/13 0325 02/17/13 0442 02/17/13 0749  BP: 148/58 122/43    Pulse:      Temp: 97.6 F (36.4 C) 97.8 F (36.6 C)  98.3 F (36.8 C)  TempSrc: Oral Axillary  Oral  Resp: 16 16  16   Height:      Weight:   112 lb 7 oz (51 kg)   SpO2: 96% 94%  100%    Intake/Output from previous day:  Intake/Output Summary (Last 24 hours) at 02/17/13 0816 Last data filed at 02/16/13 1800  Gross per 24 hour  Intake    840 ml  Output    400 ml  Net    440 ml    Physical Exam: Elderly frail female HEENT: normal Neck supple  Lungs CTA Heart:  Regular SEM  Abdomen: soft, NT/ND Ext No edema Neuro grossly non-focal Skin warm and dry   Lab Results: Basic Metabolic Panel:  Recent Labs  40/98/11 0510 02/16/13 0548  NA 134* 133*  K 5.1 4.4  CL 100 99  CO2 25 23  GLUCOSE 106* 99  BUN 54* 44*  CREATININE 2.38* 2.13*  CALCIUM 9.5 9.3   CBC:  Recent Labs  02/16/13 0548  WBC 6.9  HGB 12.1  HCT 34.5*  MCV 94.8  PLT 269     Medications:   . apixaban  2.5 mg Oral BID  . diltiazem  60 mg Oral Q8H  . ezetimibe  10 mg Oral Daily  . feeding supplement  237 mL Oral TID BM  . pantoprazole  40 mg Oral Daily  . rivastigmine  4.6 mg Transdermal QHS  . sodium chloride  3 mL Intravenous Q12H       Telemetry - NSR rate 80   Assessment/Plan:  Afib/flutter:  Converted  Continue short term eliquis 2.5 bid until office f/u with me next available  Change to cardizem CD 240 mg daily on d/c  Pulm:  CXR with emphysema.  sats ok on room air no active wheezing. F/u IM  Acute on chronic renal failure; BUN and CR stable on no diuretics EF 50%   Try to arrange PT/OT for home   D/C today   Charlton Haws 02/17/2013, 8:16 AM

## 2013-02-17 NOTE — Progress Notes (Signed)
Pt arrived as transfer to floor via wheelchair with belongings. Pt oriented to room and floor in no apparent distress. Husband at pt bedside. VSS. Will continue to monitor and assess. Baron Hamper, RN

## 2013-02-17 NOTE — Progress Notes (Addendum)
Patient extremely agitated.  Believes she is at home and that there are children playing in the street who are about to be hit by a car.  Son at beside.  Xanax 0.25 mg PO PRN Anxiety.  Emotional support to patient and son.  Bed in low position.  Side rails up.  Bed alarms on. 0145:  Patient calm, resting quietly.  Son at bedside.  Continue to monitor.

## 2013-02-17 NOTE — Progress Notes (Signed)
Report called to Nellie, RN on 4700. Pt and family aware of room assignment.

## 2013-02-18 DIAGNOSIS — Z7901 Long term (current) use of anticoagulants: Secondary | ICD-10-CM

## 2013-02-18 DIAGNOSIS — N184 Chronic kidney disease, stage 4 (severe): Secondary | ICD-10-CM | POA: Diagnosis present

## 2013-02-18 DIAGNOSIS — I5033 Acute on chronic diastolic (congestive) heart failure: Secondary | ICD-10-CM | POA: Diagnosis present

## 2013-02-18 MED ORDER — DILTIAZEM HCL 90 MG PO TABS: 90.0000 mg | ORAL_TABLET | Freq: Three times a day (TID) | ORAL | Status: DC

## 2013-02-18 MED ORDER — FUROSEMIDE 20 MG PO TABS: 20.0000 mg | ORAL_TABLET | Freq: Every day | ORAL | Status: DC | PRN

## 2013-02-18 MED ORDER — APIXABAN 2.5 MG PO TABS: 2.5000 mg | ORAL_TABLET | Freq: Two times a day (BID) | ORAL | Status: AC

## 2013-02-18 NOTE — Discharge Summary (Addendum)
CARDIOLOGY DISCHARGE SUMMARY   Patient ID: Leah Harper MRN: [id number] DOB/AGE: 77/06/1919 77 y.o.  Admit date: 02/12/2013 Discharge date: 02/18/2013  Primary Discharge Diagnosis:     Acute on chronic diastolic CHF (congestive heart failure), NYHA class 4 Secondary Discharge Diagnosis:    Atrial fibrillation with rapid ventricular response   Chronic anticoagulation   Chronic kidney disease, stage IV (severe)  Procedures:  2-D echocardiogram  Hospital Course: Leah Harper is a 79 y.o. female with a history of CHF. She was seen in the office for volume overload and was also noted to be in atrial fibrillation with rapid ventricular response. She was admitted for further evaluation and treatment.  She was initially admitted to telemetry and started on IV Cardizem for rate control as well as IV Lasix. Her heart rate became poorly controlled with a rate of greater than 140. Her Cardizem rate was increased and she was given IV metoprolol. She then had problems with bradycardia and hypotension. She was transferred to step down.  Dr. Eden Emms evaluated Leah Harper but wished to avoid a pacemaker. She was changed to oral Cardizem and this was titrated to control her heart rate without bradycardia or hypotension. Initially, when she stabilized, consideration was given to Cardizem CD. However, she could not swallow the pills and will remain on the short acting form. She continued to go in and out of atrial fibrillation but her rate was generally controlled.  She was started on apixaban for anticoagulation and is to continue this. She is tolerating it well. Dr. Eden Emms feels that this will be a short-term medication but she is to remain on it for now.  She was diuresed with IV Lasix and as her volume status improved, her respiratory status improved. Her weight at discharge is 111 pounds. By 02/13/2013, her oxygen saturation had normalized on room air. She had some emphysema on her chest x-ray but  does not carry a diagnosis of COPD. She was not wheezing and had quit tobacco almost 50 years ago. No daily diuretic is indicated. She is to monitor her weights at home and take her Lasix when necessary based on her weight. She will follow up in the office.  By 02/18/2013, Leah Harper's respiratory status had greatly improved. She was seen by physical therapy who recommended home health PT and 24 hour supervision. The assistance will generally be by the family but she will also have home health PT.  She was evaluated by Dr. Eden Emms and no further inpatient workup was recommended. She is considered stable for discharge, to follow up as an outpatient.  Labs:   Lab Results  Component Value Date   WBC 6.9 02/16/2013   HGB 12.1 02/16/2013   HCT 34.5* 02/16/2013   MCV 94.8 02/16/2013   PLT 269 02/16/2013    Recent Labs Lab 02/12/13 1500  02/16/13 0548  NA 137  < > 133*  K 3.5  < > 4.4  CL 100  < > 99  CO2 23  < > 23  BUN 35*  < > 44*  CREATININE 1.95*  < > 2.13*  CALCIUM 9.2  < > 9.3  PROT 7.0  --   --   BILITOT 0.5  --   --   ALKPHOS 73  --   --   ALT 18  --   --   AST 31  --   --   GLUCOSE 173*  < > 99  < > = values in this interval  not displayed.  Pro B Natriuretic peptide (BNP)  Date/Time Value Range Status  02/12/2013  3:00 PM 10352.0* 0 - 450 pg/mL Final     Radiology:  Dg Chest Port 1 View 02/13/2013   *RADIOLOGY REPORT*  Clinical Data: Congestive heart failure  PORTABLE CHEST - 1 VIEW  Comparison: January 28, 2013  Findings: There is no edema or consolidation.  Heart is slightly prominent in size, stable.  Pulmonary vascularity reflects underlying emphysematous change.  No adenopathy.  There is atherosclerotic change in the aorta.  There is bilateral apical pleural thickening which is stable.  No pneumothorax.  IMPRESSION: Underlying emphysematous change.  No edema or consolidation.  Heart mildly prominent but stable.   Original Report Authenticated By: Bretta Bang, M.D.   EKG:  02/13/2013 Atrial fibrillation, borderline rapid ventricular response  Vent. rate 91 BPM PR interval * ms QRS duration 120 ms QT/QTc 404/496 ms P-R-T axes * -41 157  Echo: 02/14/2013 Conclusions - Left ventricle: Hypokinesis of the inferolateral wall. Technically limited study. The cavity size was normal. Wall thickness was increased in a pattern of mild LVH. The estimated ejection fraction was 50%. - Aortic valve: Sclerosis without stenosis. - Mitral valve: Calcified annulus. - Right atrium: The atrium was mildly dilated.   FOLLOW UP PLANS AND APPOINTMENTS Allergies  Allergen Reactions  . Statins     Myalgia   . Tylenol [Acetaminophen]     Facial rash     Medication List         ALPRAZolam 0.25 MG tablet  Commonly known as:  XANAX  Take 1 tablet (0.25 mg total) by mouth 2 (two) times daily as needed. For anxiety     apixaban 2.5 MG Tabs tablet  Commonly known as:  ELIQUIS  Take 1 tablet (2.5 mg total) by mouth 2 (two) times daily.     aspirin 81 MG tablet  Take 81 mg by mouth daily.     diltiazem 90 MG tablet  Commonly known as:  CARDIZEM  Take 1 tablet (90 mg total) by mouth every 8 (eight) hours.     ENSURE  Take 237 mLs by mouth 2 (two) times daily between meals.     ezetimibe 10 MG tablet  Commonly known as:  ZETIA  Take 1 tablet (10 mg total) by mouth daily.     furosemide 20 MG tablet  Commonly known as:  LASIX  Take 1 tablet (20 mg total) by mouth daily as needed. Weight gain > 5 lbs     omeprazole 20 MG capsule  Commonly known as:  PRILOSEC  Take 20 mg by mouth daily as needed (acid reflux).     POLYSPORIN EX  Apply 1 application topically daily as needed (outbreak on legs). cream     rivastigmine 4.6 mg/24hr  Commonly known as:  EXELON  Place 1 patch onto the skin at bedtime.        Discharge Orders   Future Appointments Provider Department Dept Phone   03/06/2013 3:40 PM Beatrice Lecher, PA-C [facility] Main [facility] [place])  [phone]   04/02/2013 1:30 PM Sandford Craze, NP [facility] at  [facility] [phone]   Future Orders Complete By Expires   (HEART FAILURE PATIENTS) Call MD:  Anytime you have any of the following symptoms: 1) 3 pound weight gain in 24 hours or 5 pounds in 1 week 2) shortness of breath, with or without a dry hacking cough 3) swelling in the hands, feet or stomach 4) if  you have to sleep on extra pillows at night in order to breathe.  As directed    Diet - low sodium heart healthy  As directed    Increase activity slowly  As directed      Follow-up Information   Follow up with Tereso Newcomer, PA-C On 03/06/2013. (at 3:40 pm.)    Specialty:  Physician Assistant   Contact information:   [address] [place] [address] [phone]       BRING ALL MEDICATIONS WITH YOU TO FOLLOW UP APPOINTMENTS  Time spent with patient to include physician time: 35 min Signed: Theodore Demark, PA-C 02/18/2013, 1:17 PM Co-Sign MD

## 2013-02-18 NOTE — Progress Notes (Signed)
Pt's husband at bedside requesting to know what time spouse will be d/c home.  Informed Trish and instructed that R. Barrett will come d/c pt. This am.  Called and spoke with [place] and instructed she is working on d/c orders and will let us know when completed.  Pt and spouse made aware.  Will continue to monitor.  Amanda Pea, Charity fundraiser.

## 2013-02-18 NOTE — Progress Notes (Signed)
Pt HR in 70-80s this AM, 90mg  PO cardizem tablet given this AM. Pt HR brady to 40 this AM non-sustained, HR now sustaining in 60s. Baron Hamper, RN

## 2013-02-18 NOTE — Progress Notes (Signed)
All d/c instructions explained and given to pt. And husband.  Verbalized understanding.  D/c to home.  Transported off floor via w/c by assigned NT & escorted by husband.  Amanda Pea, Charity fundraiser.

## 2013-02-18 NOTE — Progress Notes (Signed)
   Patient ID: Leah Harper, female   DOB: December 29, 1919, 79 y.o.   MRN: [id number]    Subjective:  Denies dyspnea or chest pain Converted to NSR    Objective:  Filed Vitals:   02/17/13 1600 02/17/13 1900 02/17/13 2147 02/18/13 0530  BP: 143/55 127/53 109/45 160/62  Pulse:  66 72 83  Temp: 98 F (36.7 C) 97.7 F (36.5 C) 97.8 F (36.6 C) 98.3 F (36.8 C)  TempSrc:  Oral Oral Oral  Resp: 16 22 20 18   Height:  5\' 4"  (1.626 m)    Weight:  111 lb (50.349 kg)    SpO2: 98% 96% 97% 95%    Intake/Output from previous day:  Intake/Output Summary (Last 24 hours) at 02/18/13 0757 Last data filed at 02/17/13 2200  Gross per 24 hour  Intake    960 ml  Output      0 ml  Net    960 ml    Physical Exam: Elderly frail female HEENT: normal Neck supple  Lungs CTA Heart:  Regular SEM  Abdomen: soft, NT/ND Ext No edema Neuro grossly non-focal Skin warm and dry   Lab Results: Basic Metabolic Panel:  Recent Labs  30/86/57 0548  NA 133*  K 4.4  CL 99  CO2 23  GLUCOSE 99  BUN 44*  CREATININE 2.13*  CALCIUM 9.3   CBC:  Recent Labs  02/16/13 0548  WBC 6.9  HGB 12.1  HCT 34.5*  MCV 94.8  PLT 269     Medications:   . apixaban  2.5 mg Oral BID  . diltiazem  90 mg Oral Q8H  . ezetimibe  10 mg Oral Daily  . feeding supplement  237 mL Oral TID BM  . pantoprazole  40 mg Oral Daily  . rivastigmine  4.6 mg Transdermal QHS  . sodium chloride  3 mL Intravenous Q12H       Telemetry - NSR rate 80   Assessment/Plan:  Afib/flutter:  Converted  Continue short term eliquis 2.5 bid until office f/u with me next available  Keep on short acting cardizem unable to swallow LA pill  Pulm:  CXR with emphysema.  sats ok on room air no active wheezing. F/u IM  Acute on chronic renal failure; BUN and CR stable on no diuretics EF 50%   Try to arrange PT/OT for home   D/C today   Charlton Haws 02/18/2013, 7:57 AM

## 2013-02-18 NOTE — Progress Notes (Signed)
Physical Therapy Treatment Patient Details Name: Leah Harper MRN: [id number] DOB: [birth year] Today's Date: 02/18/2013 Time: 4098-1191 PT Time Calculation (min): 23 min  PT Assessment / Plan / Recommendation  History of Present Illness This is a 79 year old female patient who has history of hypertension, hypothyroidism, chronic kidney disease and a history of a nonischemic nuclear study in 2003. She had an echo in January 2013 show mild LVH, EF 60%, aortic sclerosis without stenosis, mild AI, question of RV dysfunction, trivial pericardial effusion. She recently was seen by Alma Downs. Lendell Caprice nurse practitioner for 13 pound weight gain and was treated with IV Lasix for her heart failure. Her beta blocker was stopped  for bradycardia.  Pt with hx of dementia and currently in afib with bradycardia.     PT Comments   Pt progressing with mobility but does present with DOE with activity & c/o fatigue & weakness.  Pt able to perform stair training this session.     Follow Up Recommendations  Home health PT;Supervision/Assistance - 24 hour     Does the patient have the potential to tolerate intense rehabilitation     Barriers to Discharge        Equipment Recommendations  None recommended by PT    Recommendations for Other Services    Frequency Min 3X/week   Progress towards PT Goals Progress towards PT goals: Progressing toward goals  Plan Current plan remains appropriate    Precautions / Restrictions Precautions Precautions: Fall Restrictions Weight Bearing Restrictions: No   Pertinent Vitals/Pain No pain reported     Mobility  Bed Mobility Bed Mobility: Supine to Sit;Sitting - Scoot to Edge of Bed Supine to Sit: 4: Min guard;HOB flat;With rails Sitting - Scoot to Edge of Bed: 5: Supervision Details for Bed Mobility Assistance: Incr time but no physical (A) needed.   Transfers Transfers: Sit to Stand;Stand to Sit Sit to Stand: 4: Min guard;With upper extremity assist;From  bed Stand to Sit: 4: Min guard;With upper extremity assist;With armrests;To chair/3-in-1 Details for Transfer Assistance: cues for hand placement Ambulation/Gait Ambulation/Gait Assistance: 4: Min guard Ambulation Distance (Feet): 180 Feet Assistive device: Rolling walker Ambulation/Gait Assistance Details: Cues to stay closer to RW.   Gait Pattern: Step-through pattern;Decreased stride length Stairs: Yes Stairs Assistance: 4: Min assist Stairs Assistance Details (indicate cue type and reason): (A) for stability & safety Stair Management Technique: One rail Left;Step to pattern;Forwards Number of Stairs: 4 Wheelchair Mobility Wheelchair Mobility: No       PT Goals (current goals can now be found in the care plan section) Acute Rehab PT Goals PT Goal Formulation: With family Time For Goal Achievement: 02/22/13 Potential to Achieve Goals: Good  Visit Information  Last PT Received On: 02/18/13 Assistance Needed: +1 History of Present Illness: This is a 79 year old female patient who has history of hypertension, hypothyroidism, chronic kidney disease and a history of a nonischemic nuclear study in 2003. She had an echo in January 2013 show mild LVH, EF 60%, aortic sclerosis without stenosis, mild AI, question of RV dysfunction, trivial pericardial effusion. She recently was seen by Alma Downs. Lendell Caprice nurse practitioner for 13 pound weight gain and was treated with IV Lasix for her heart failure. Her beta blocker was stopped  for bradycardia.  Pt with hx of dementia and currently in afib with bradycardia.      Subjective Data      Cognition  Cognition Arousal/Alertness: Awake/alert Behavior During Therapy: WFL for tasks assessed/performed Overall Cognitive Status: History of  cognitive impairments - at baseline    Balance     End of Session PT - End of Session Equipment Utilized During Treatment: Gait belt Activity Tolerance: Patient tolerated treatment well Patient left: in  chair;with call bell/phone within reach;with family/visitor present Nurse Communication: Mobility status   GP     Lara Mulch 02/18/2013, 12:47 PM   Verdell Face, PTA [phone] 02/18/2013

## 2013-02-19 ENCOUNTER — Telehealth: Payer: Self-pay | Admitting: Cardiovascular Disease

## 2013-02-19 NOTE — Telephone Encounter (Signed)
New problem   Pt want to know why her ins is denying her Eliquis prescription. Please call pt

## 2013-02-19 NOTE — Telephone Encounter (Signed)
Attempted to call [name], pts emergency contact, back but he did not answer and his phone does not allow opportunity to leave message. Will forward message to Wynona Canes, Dr Fabio Bering nurse.

## 2013-02-20 NOTE — Telephone Encounter (Signed)
New problem   [facility] need prior authorization for Eliquis 2.5mg  pt has ran out. Please call

## 2013-02-20 NOTE — Telephone Encounter (Signed)
Can you see whats going on with her eliquis She can come to office for samples if needed

## 2013-02-20 NOTE — Telephone Encounter (Signed)
PT'S SON AWARE MED  HAS BEEN  APPROVED  FOR  1 YEAR FROM TODAYS  DATE  PT'S ID  NUMBER  IS [id number]

## 2013-02-21 ENCOUNTER — Telehealth: Payer: Self-pay | Admitting: Cardiovascular Disease

## 2013-02-21 NOTE — Telephone Encounter (Signed)
[facility] had orders to see pt, when she got there family refused care

## 2013-02-21 NOTE — Telephone Encounter (Signed)
Will let Dr. Nishan know. 

## 2013-02-25 ENCOUNTER — Telehealth: Payer: Self-pay | Admitting: Family

## 2013-02-25 ENCOUNTER — Ambulatory Visit (HOSPITAL_BASED_OUTPATIENT_CLINIC_OR_DEPARTMENT_OTHER)
Admission: RE | Admit: 2013-02-25 | Discharge: 2013-02-25 | Disposition: A | Discharge status: Home / Self Care | Payer: Medicare Other | Source: Ambulatory Visit | Attending: Family | Admitting: Family

## 2013-02-25 ENCOUNTER — Encounter: Payer: Self-pay | Admitting: Family

## 2013-02-25 ENCOUNTER — Ambulatory Visit (INDEPENDENT_AMBULATORY_CARE_PROVIDER_SITE_OTHER): Payer: Medicare Other | Admitting: Family

## 2013-02-25 ENCOUNTER — Telehealth: Payer: Self-pay

## 2013-02-25 VITALS — BP 100/60 | HR 81 | Temp 98.3°F | Wt 114.1 lb

## 2013-02-25 DIAGNOSIS — R0602 Shortness of breath: Secondary | ICD-10-CM

## 2013-02-25 DIAGNOSIS — J90 Pleural effusion, not elsewhere classified: Secondary | ICD-10-CM | POA: Insufficient documentation

## 2013-02-25 DIAGNOSIS — I509 Heart failure, unspecified: Secondary | ICD-10-CM

## 2013-02-25 DIAGNOSIS — R062 Wheezing: Secondary | ICD-10-CM | POA: Insufficient documentation

## 2013-02-25 DIAGNOSIS — N289 Disorder of kidney and ureter, unspecified: Secondary | ICD-10-CM

## 2013-02-25 LAB — BASIC METABOLIC PANEL
BUN: 24 mg/dL — ABNORMAL HIGH (ref 6–23)
CO2: 21 mEq/L (ref 19–32)
Calcium: 9 mg/dL (ref 8.4–10.5)
Creat: 1.77 mg/dL — ABNORMAL HIGH (ref 0.50–1.10)
Glucose, Bld: 96 mg/dL (ref 70–99)

## 2013-02-25 MED ORDER — FUROSEMIDE 20 MG PO TABS: 20.0000 mg | ORAL_TABLET | Freq: Every day | ORAL | Status: DC | PRN

## 2013-02-25 NOTE — Telephone Encounter (Signed)
Reviewed CXR.  Noted bibasilar effusions, pulmonary edema. Spoke with husband, advised him to start lasix daily as we discussed at her appointment.  He verbalized understanding. She is to keep upcoming cardiology appointment with Tereso Newcomer, and will need a follow up BMET at that appointment please.

## 2013-02-25 NOTE — Assessment & Plan Note (Addendum)
Weight is up 3 pounds.  Oxygen 92% on room air.  She maintained her sats with ambulation.  Recommended that she resume furosemide daily.  Keep upcoming appointment with cardiology.  Check bmet today. Obtain CXR to exclude pneumonia.  She will need bmet redrawn at her upcoming cardiology appointment. I will see her back in 1 month.

## 2013-02-25 NOTE — Telephone Encounter (Signed)
FYITheodoro Grist called [facility] regarding pts heart. Pt was discharged from ED on 8-27, shortness of breath, dry cough, and very fatigued.  Pt was recently hospitalized for a-fib. Pt was in [facility] X 1 week and was released on 02-19-13. Pt has developed a dry cough on 02-19-13. Pt has SOB with the cough. RN triaged and advise ED. Pts son will take her to [facility]

## 2013-02-25 NOTE — Progress Notes (Signed)
Subjective:    Patient ID: Leah Harper, female    DOB: [birth year], 79 y.o.   MRN: [id number]  HPI  [name] is a 79 yr old female who presents today for hospital follow up.  She was admitted 8/20- 8/26 with AF RVR. Records are reviewed. She was noted to be volume overloaded and was admitted for further evaluation and treatment.  During her hospitalization she was treated with IV cardizem and ultimately developed bradycardia and hypotension.  She was placed in step down.  Ultimately she was transitioned to oral cardizem. She was placed on apixaban for anticoagulation.  She was diuresed with IV lasix.  Creatinine at time of discharge was 2.1.    Husband feels that her breathing is worse in the morning and at night. She is able to sleep flat on one pillow.  She has some LE swelling but husband notes that it has improved.  She denies CP. Reports that they were instructed by cardiology   Review of Systems See HPI  Past Medical History  Diagnosis Date  . Hyperlipidemia   . Hypertension   . AF (atrial fibrillation)   . Hyperthyroidism     remotely in 1941  . Normocytic anemia     2002  . S/P TAH-BSO (total abdominal hysterectomy and bilateral salpingo-oophorectomy)   . Allergy   . Arrhythmia   . Anxiety   . Dementia   . CHF (congestive heart failure)   . Kidney infection     history of   . GERD (gastroesophageal reflux disease)     History   Social History  . Marital Status: Married    Spouse Name: [name]    Number of Children: 3  . Years of Education: N/A   Occupational History  . retired    Social History Main Topics  . Smoking status: Former Smoker    Quit date: 07/17/1963  . Smokeless tobacco: Never Used  . Alcohol Use: No  . Drug Use: No  . Sexual Activity: Not on file   Other Topics Concern  . Not on file   Social History Narrative   Retired   Married -3 children    Tobacco abuse-no   Alcohol use-no     Regular exercise-no    Past Surgical  History  Procedure Laterality Date  . Tonsillectomy    . Total abdominal hysterectomy w/ bilateral salpingoophorectomy  1986  . Bilateral salpingoophorectomy    . Bladder suspension      bladder tack with TAHBSO    Family History  Problem Relation Age of Onset  . Heart disease Father   . Hyperlipidemia Father   . Heart attack Father   . Heart disease Brother     heart attack  . Heart disease Brother     Allergies  Allergen Reactions  . Statins     Myalgia   . Tylenol [Acetaminophen]     Facial rash    Current Outpatient Prescriptions on File Prior to Visit  Medication Sig Dispense Refill  . ALPRAZolam (XANAX) 0.25 MG tablet Take 1 tablet (0.25 mg total) by mouth 2 (two) times daily as needed. For anxiety  30 tablet  0  . apixaban (ELIQUIS) 2.5 MG TABS tablet Take 1 tablet (2.5 mg total) by mouth 2 (two) times daily.  60 tablet  11  . aspirin 81 MG tablet Take 81 mg by mouth daily.        . Bacitracin-Polymyxin B (POLYSPORIN EX) Apply 1 application topically daily  as needed (outbreak on legs). cream      . diltiazem (CARDIZEM) 90 MG tablet Take 1 tablet (90 mg total) by mouth every 8 (eight) hours.  90 tablet  11  . ENSURE (ENSURE) Take 237 mLs by mouth 2 (two) times daily between meals.       Marland Kitchen ezetimibe (ZETIA) 10 MG tablet Take 1 tablet (10 mg total) by mouth daily.  90 tablet  1  . omeprazole (PRILOSEC) 20 MG capsule Take 20 mg by mouth daily as needed (acid reflux).       . rivastigmine (EXELON) 4.6 mg/24hr Place 1 patch onto the skin at bedtime.      . furosemide (LASIX) 20 MG tablet Take 1 tablet (20 mg total) by mouth daily as needed. Weight gain > 5 lbs  30 tablet  0   No current facility-administered medications on file prior to visit.    BP 100/60  Pulse 81  Temp(Src) 98.3 F (36.8 C) (Oral)  Wt 114 lb 1.9 oz (51.764 kg)  BMI 19.58 kg/m2  SpO2 92%       Objective:   Physical Exam  Constitutional: She appears well-developed and well-nourished. No  distress.  Cardiovascular: Normal rate and regular rhythm.   No murmur heard. Pulmonary/Chest:  Mild increase wob with ambulation is noted.  Soft right sided expiratory wheeze is noted.   Musculoskeletal:  2+ bilateral LE swelling.   Psychiatric: She has a normal mood and affect. Her speech is normal and behavior is normal. Cognition and memory are impaired.          Assessment & Plan:

## 2013-02-25 NOTE — Patient Instructions (Addendum)
Please complete lab work prior to leaving. Follow up in 1 month. Keep upcoming appointment with cardiology.

## 2013-02-28 ENCOUNTER — Telehealth: Payer: Self-pay | Admitting: *Deleted

## 2013-02-28 MED ORDER — MEGESTROL ACETATE 40 MG/ML PO SUSP: 400.0000 mg | Freq: Every day | ORAL | Status: DC

## 2013-02-28 MED ORDER — ALPRAZOLAM 0.25 MG PO TABS: 0.2500 mg | ORAL_TABLET | Freq: Two times a day (BID) | ORAL | Status: DC | PRN

## 2013-02-28 NOTE — Telephone Encounter (Signed)
Can we please check on Leah Harper to see how she is doing since Lemont Fillers., NP had her restart Lasix daily? Thanks Tereso Newcomer, PA-C   02/28/2013 1:28 PM

## 2013-02-28 NOTE — Telephone Encounter (Signed)
Pt's husband left message on voicemail requesting refill of pt's xanax. Last rx provided 01/28/13, #30; 1 twice daily as needed. He states he has been giving her 1 every night and has run out.  Also states that pt's appetite has decreased and she is not eating since stopping the megace.  He is requesting that pt restart Megace.  Please advise.

## 2013-02-28 NOTE — Telephone Encounter (Signed)
OK to send #60 of xanax.  Megace refill sent.

## 2013-02-28 NOTE — Telephone Encounter (Signed)
Spoke to patient's husband who states patient is doing "fair."  Husband states patient is very weak and he is trying to get her to eat and drink.  Husband states he cannot tell much difference since patient started taking Lasix Tuesday.  He reports BP is 130/70 and heart rate is in the 70's he thinks.  I advised patient's husband to call us if he has any questions or concerns and verified patient's appointment with Tereso Newcomer, PA-C on 9/11 @ 3:40.

## 2013-02-28 NOTE — Telephone Encounter (Signed)
Notified pts husband.

## 2013-03-03 NOTE — Telephone Encounter (Signed)
Ok.  Call if breathing worse. Otherwise follow up 9/11. Tereso Newcomer, PA-C   03/03/2013 5:40 PM

## 2013-03-04 ENCOUNTER — Telehealth: Payer: Self-pay | Admitting: *Deleted

## 2013-03-04 NOTE — Telephone Encounter (Signed)
Attempted to leave message on phone that eliquis approved for 12 months, have faxed approval to [facility]

## 2013-03-04 NOTE — Telephone Encounter (Signed)
Spoke with patient's husband who states patient is doing "a little bit better."  Husband states they will see [place] Thursday unless patient gets worse, at which point they will call the office.

## 2013-03-06 ENCOUNTER — Encounter: Payer: Self-pay | Admitting: Physician Assistant

## 2013-03-06 ENCOUNTER — Ambulatory Visit (INDEPENDENT_AMBULATORY_CARE_PROVIDER_SITE_OTHER): Payer: Medicare Other | Admitting: Physician Assistant

## 2013-03-06 ENCOUNTER — Telehealth: Payer: Self-pay | Admitting: Family

## 2013-03-06 VITALS — BP 126/68 | HR 94 | Ht 64.0 in | Wt 111.0 lb

## 2013-03-06 DIAGNOSIS — I10 Essential (primary) hypertension: Secondary | ICD-10-CM

## 2013-03-06 DIAGNOSIS — I4891 Unspecified atrial fibrillation: Secondary | ICD-10-CM

## 2013-03-06 DIAGNOSIS — R0609 Other forms of dyspnea: Secondary | ICD-10-CM

## 2013-03-06 DIAGNOSIS — IMO0002 Reserved for concepts with insufficient information to code with codable children: Secondary | ICD-10-CM

## 2013-03-06 DIAGNOSIS — R630 Anorexia: Secondary | ICD-10-CM

## 2013-03-06 DIAGNOSIS — I5032 Chronic diastolic (congestive) heart failure: Secondary | ICD-10-CM

## 2013-03-06 MED ORDER — DILTIAZEM HCL 90 MG PO TABS: 45.0000 mg | ORAL_TABLET | Freq: Two times a day (BID) | ORAL | Status: DC

## 2013-03-06 NOTE — Progress Notes (Signed)
[address] [place], [place]  [address] Phone: [phone] Fax:  [phone]  Date:  03/06/2013   ID:  Leah Harper, DOB September 07, 1919, MRN [id number]  PCP:  Lemont Fillers., NP  Cardiologist:  Dr. Charlton Haws     History of Present Illness: Leah Harper is a 79 y.o. female who returns for follow up after a recent admission to the hospital.  She has a hx of HTN, HL, remote hypothyroidism, CKD. She has a hx of non-ischemic nuclear study in 2003. Echo 1/13: Mild LVH, EF 60%, aortic sclerosis without aortic stenosis, mild AI, question RV dysfunction, trivial pericardial effusion. Last seen by Dr. Eden Emms 2/13. I saw her in 2/14 with bradycardia there was fairly asymptomatic. Beta blocker therapy was discontinued at that time.    She was admitted 8/20-8/26 with a/c diastolic CHF in the setting of AFib with RVR.  She was diuresed with IV Lasix.  She had evidence of tachy-brady syndrome with IV Dilt and IV Metoprolol for rate control.  She went in and out of AFib during her hospital stay and was ultimately d/c on short acting form of diltiazem to control HR.  She was placed on Apixaban.  Echo 02/14/13: Inferolateral HK, mild LVH, EF 50%, MAC, mild RAE.  Seen in follow up by PCP 02/25/13. Breathing was worse. Chest x-ray demonstrated mild edema with small bilateral effusions. She was placed back on daily Lasix until follow up today.  She is here with her husband and son. Her husband provides most of the history due to to baseline dementia. She continues to have significant weakness and dyspnea with most ADLs. She is not eating. She has been placed on Megace to help with her appetite. She has had improvement in her LE edema since being placed on Lasix. There has been no syncope. There has been no chest pain. There has been no orthopnea or PND.  Labs (1/13): TSH 1.823  Labs (1/14): K 4.3, creatinine 1.60, ALT 19, Hgb 14.5  Labs (8/14): K 4.4, creatinine 2.13, ALT 18, BNP 10352,  Hgb 12.1, TSH 1.888 Labs (9/14): K 4, creatinine 1.77   Wt Readings from Last 3 Encounters:  03/06/13 111 lb (50.349 kg)  02/25/13 114 lb 1.9 oz (51.764 kg)  02/17/13 111 lb (50.349 kg)     Past Medical History  Diagnosis Date  . Hyperlipidemia   . Hypertension   . AF (atrial fibrillation)   . Hyperthyroidism     remotely in 1941  . Normocytic anemia     2002  . S/P TAH-BSO (total abdominal hysterectomy and bilateral salpingo-oophorectomy)   . Allergy   . Arrhythmia   . Anxiety   . Dementia   . CHF (congestive heart failure)   . Kidney infection     history of   . GERD (gastroesophageal reflux disease)     Current Outpatient Prescriptions  Medication Sig Dispense Refill  . ALPRAZolam (XANAX) 0.25 MG tablet Take 1 tablet (0.25 mg total) by mouth 2 (two) times daily as needed. For anxiety  60 tablet  0  . apixaban (ELIQUIS) 2.5 MG TABS tablet Take 1 tablet (2.5 mg total) by mouth 2 (two) times daily.  60 tablet  11  . aspirin 81 MG tablet Take 81 mg by mouth daily.        . Bacitracin-Polymyxin B (POLYSPORIN EX) Apply 1 application topically daily as needed (outbreak on legs). cream      . diltiazem (CARDIZEM) 90 MG  tablet Take 1 tablet (90 mg total) by mouth every 8 (eight) hours.  90 tablet  11  . ENSURE (ENSURE) Take 237 mLs by mouth 2 (two) times daily between meals.       Marland Kitchen ezetimibe (ZETIA) 10 MG tablet Take 1 tablet (10 mg total) by mouth daily.  90 tablet  1  . furosemide (LASIX) 20 MG tablet Take 1 tablet (20 mg total) by mouth daily as needed. Weight gain > 5 lbs  30 tablet  0  . megestrol (MEGACE) 40 MG/ML suspension Take 10 mLs (400 mg total) by mouth daily.  240 mL  3  . omeprazole (PRILOSEC) 20 MG capsule Take 20 mg by mouth daily as needed (acid reflux).       . rivastigmine (EXELON) 4.6 mg/24hr Place 1 patch onto the skin at bedtime.       No current facility-administered medications for this visit.    Allergies:    Allergies  Allergen Reactions  .  Statins     Myalgia   . Tylenol [Acetaminophen]     Facial rash    Social History:  The patient  reports that she quit smoking about 49 years ago. She has never used smokeless tobacco. She reports that she does not drink alcohol or use illicit drugs.   ROS:  Please see the history of present illness.   She has a nonproductive cough. She has significant constipation. There was some bright red blood on the tissue recently after straining to have a bowel movement. Otherwise no melena or hematochezia.   All other systems reviewed and negative.   PHYSICAL EXAM: VS:  BP 126/68  Pulse 94  Ht 5\' 4"  (1.626 m)  Wt 111 lb (50.349 kg)  BMI 19.04 kg/m2 Well nourished, well developed, in no acute distress HEENT: normal Neck: no JVD Cardiac:  normal S1, S2; irregularly irregular rhythm; no murmur Lungs:  clear to auscultation bilaterally, no wheezing, rhonchi or rales Abd: soft, nontender, no hepatomegaly Ext: trace bilateral LE edema Skin: warm and dry Neuro:  CNs 2-12 intact, no focal abnormalities noted  EKG:  atrial fibrillation, HR 94     ASSESSMENT AND PLAN:  1. Atrial Fibrillation:  Rate is controlled. She remains on Apixaban.  She is currently exhibiting symptoms of failure to thrive.  It is not clear to me if this is solely related to atrial fibrillation. I suspect her failure to thrive is related to a combination of deconditioning, dementia, advanced age, heart failure and atrial fibrillation. Question if her medications are causing her more fatigue. I reviewed her case with Dr. Graciela Husbands (DOD) to discuss the possibility of rhythm control.  However, I am not convinced that keeping her in NSR is the only answer. Dr. Graciela Husbands agreed.  We should certainly consider cutting back on some of her medications to see if this helps. Her husband is concerned about her having a stroke. I will hold off on stopping Apixaban for now. I will cut back on her diltiazem to 45 mg twice a day (she is currently only  taking diltiazem 90 mg twice a day).  I have encouraged the patient's husband to arrange home health physical therapy to help increase her activity.  They will continue to try to advance her diet.  It is easier for her to follow up at Ms. O'Sullivan's office.  I will have her follow up in the next one to 2 weeks for an ECG to check her HR. It is acceptable to  have a heart rate of 110 in chronic atrial fibrillation.  We could consider discontinuing diltiazem at that time if her heart rate remains below 110 and there has been some improvement with reducing the dose.  Check CBC today.  2. Failure to Thrive:  Follow up with O'SULLIVAN,MELISSA S., NP. 3. Chronic Diastolic CHF:  Volume stable.  Arrange follow up CXR. Check BMET and BNP. 4. Hypertension:  Controlled.  Continue current therapy.  5. CKD:  Check BMET today. 6. Disposition:  F/u with Ms. O'Sullivan in 1-2 weeks and Dr. Charlton Haws in 4-5 weeks.   Signed, Tereso Newcomer, PA-C  03/06/2013 4:25 PM

## 2013-03-06 NOTE — Telephone Encounter (Signed)
Patients husband states that patient is eating very little and would like to know if he can give her the 2nd dose of the medicine for appetite?(he does not know med name).

## 2013-03-06 NOTE — Patient Instructions (Addendum)
STOP ASPIRIN  DECREASE DILTIAZEM TO 45 MG TWICE DAILY (1/2 TAB OF THE 90 MG)  LABS TODAY; BMET, CBC W/DIFF, BNP  CHEST X-RAY AT [facility] IN [place] ; DR. Arvil Chaco OFFICE  SET UP WITH PT/OT  PLEASE FOLLOW UP WITH DR. Peggyann Juba IN ABOUT 1-2 WEEKS  PLEASE FOLLOW UP WITH DR. Eden Emms IN ABOUT 4-5 WEEKS

## 2013-03-07 ENCOUNTER — Ambulatory Visit (HOSPITAL_BASED_OUTPATIENT_CLINIC_OR_DEPARTMENT_OTHER)
Admission: RE | Admit: 2013-03-07 | Discharge: 2013-03-07 | Disposition: A | Discharge status: Home / Self Care | Payer: Medicare Other | Source: Ambulatory Visit | Attending: Physician Assistant | Admitting: Physician Assistant

## 2013-03-07 ENCOUNTER — Telehealth: Payer: Self-pay | Admitting: *Deleted

## 2013-03-07 DIAGNOSIS — I5032 Chronic diastolic (congestive) heart failure: Secondary | ICD-10-CM | POA: Insufficient documentation

## 2013-03-07 DIAGNOSIS — J449 Chronic obstructive pulmonary disease, unspecified: Secondary | ICD-10-CM | POA: Insufficient documentation

## 2013-03-07 DIAGNOSIS — I10 Essential (primary) hypertension: Secondary | ICD-10-CM | POA: Insufficient documentation

## 2013-03-07 DIAGNOSIS — I4891 Unspecified atrial fibrillation: Secondary | ICD-10-CM

## 2013-03-07 DIAGNOSIS — J4489 Other specified chronic obstructive pulmonary disease: Secondary | ICD-10-CM | POA: Insufficient documentation

## 2013-03-07 DIAGNOSIS — I517 Cardiomegaly: Secondary | ICD-10-CM | POA: Insufficient documentation

## 2013-03-07 DIAGNOSIS — J984 Other disorders of lung: Secondary | ICD-10-CM | POA: Insufficient documentation

## 2013-03-07 LAB — CBC WITH DIFFERENTIAL/PLATELET
Basophils Relative: 0.5 % (ref 0.0–3.0)
Eosinophils Relative: 1 % (ref 0.0–5.0)
Lymphocytes Relative: 18.5 % (ref 12.0–46.0)
Neutrophils Relative %: 73 % (ref 43.0–77.0)
RBC: 3.64 Mil/uL — ABNORMAL LOW (ref 3.87–5.11)
WBC: 8.8 10*3/uL (ref 4.5–10.5)

## 2013-03-07 LAB — BASIC METABOLIC PANEL
Calcium: 8.5 mg/dL (ref 8.4–10.5)
Creatinine, Ser: 2.1 mg/dL — ABNORMAL HIGH (ref 0.4–1.2)
GFR: 23.22 mL/min — ABNORMAL LOW (ref >60.00)

## 2013-03-07 LAB — BRAIN NATRIURETIC PEPTIDE: Pro B Natriuretic peptide (BNP): 1047 pg/mL — ABNORMAL HIGH (ref 0.0–100.0)

## 2013-03-07 NOTE — Addendum Note (Signed)
Addended by: Mervin Kung A on: 03/07/2013 03:45 PM   Modules accepted: Orders

## 2013-03-07 NOTE — Telephone Encounter (Signed)
Notified pt's son and he will bring her by the office around 11:30am. Follow up scheduled for 03/14/13 at 3:15pm.

## 2013-03-07 NOTE — Telephone Encounter (Signed)
Do not give a second dose of megace.  If her appetite has changed, I would like to see her in the office please to make sure nothing new such as UTI.

## 2013-03-07 NOTE — Telephone Encounter (Signed)
pt's husband notified about lab & cxr results with verbal understanding to results. He has been advised to have pt increase dietary K+ gave suggestions for some foods, bmet, cbc w/diff 9/19 @ PCP.Marland Kitchen

## 2013-03-07 NOTE — Telephone Encounter (Signed)
Spoke with pt's son. He states cardiology has decreased cardizem to 1/2 tablet twice a day. Also took pt off of ASA. He is having pt repeat chest xray. Per pt's son, cardiology does not want to do cardioversion at this time as pt seems to be going in and out of afib on her own and "not getting hung up".  States cardiology wants pt to f/u with [place] in 1 week. He states pt has been taking megace x 1 week and thinks appetite may be a little better. He wants to know if they can just bring pt by for urine after she has her xray or do you still need to see pt today?

## 2013-03-07 NOTE — Telephone Encounter (Signed)
If appetite is improving, ok to bring her by just for urine culture.  Lets see her for an office visit in 1-2 weeks.

## 2013-03-07 NOTE — Telephone Encounter (Signed)
Pt returned with urine specimen. Order entered. Please call pt with result when available.

## 2013-03-09 LAB — URINE CULTURE: Colony Count: >=100000

## 2013-03-10 ENCOUNTER — Telehealth: Payer: Self-pay | Admitting: Family

## 2013-03-10 MED ORDER — CIPROFLOXACIN HCL 250 MG PO TABS: 250.0000 mg | ORAL_TABLET | Freq: Two times a day (BID) | ORAL | Status: DC

## 2013-03-10 NOTE — Telephone Encounter (Signed)
Notified pt and he voices understanding. Notes that pt now has a rash around her rectum which he states he is going to pick up a cream for it at the pharmacy. Pt has appt with [place] on Friday. Advised him if rash / symptoms worsen to let us know.

## 2013-03-10 NOTE — Telephone Encounter (Signed)
  Urine culture + for bacteria.  She should start cipro.

## 2013-03-10 NOTE — Telephone Encounter (Signed)
Agree with plan 

## 2013-03-14 ENCOUNTER — Encounter: Payer: Self-pay | Admitting: Family

## 2013-03-14 ENCOUNTER — Ambulatory Visit (INDEPENDENT_AMBULATORY_CARE_PROVIDER_SITE_OTHER): Payer: Medicare Other | Admitting: Family

## 2013-03-14 VITALS — BP 150/78 | HR 102 | Temp 97.9°F | Resp 18 | Ht 64.0 in | Wt 112.0 lb

## 2013-03-14 DIAGNOSIS — E876 Hypokalemia: Secondary | ICD-10-CM

## 2013-03-14 DIAGNOSIS — D649 Anemia, unspecified: Secondary | ICD-10-CM

## 2013-03-14 DIAGNOSIS — L89151 Pressure ulcer of sacral region, stage 1: Secondary | ICD-10-CM

## 2013-03-14 DIAGNOSIS — N390 Urinary tract infection, site not specified: Secondary | ICD-10-CM

## 2013-03-14 DIAGNOSIS — L89109 Pressure ulcer of unspecified part of back, unspecified stage: Secondary | ICD-10-CM

## 2013-03-14 DIAGNOSIS — L8991 Pressure ulcer of unspecified site, stage 1: Secondary | ICD-10-CM

## 2013-03-14 DIAGNOSIS — I4891 Unspecified atrial fibrillation: Secondary | ICD-10-CM

## 2013-03-14 LAB — CBC WITH DIFFERENTIAL/PLATELET
Basophils Relative: 1 % (ref 0–1)
Eosinophils Absolute: 0 10*3/uL (ref 0.0–0.7)
HCT: 36.5 % (ref 36.0–46.0)
Hemoglobin: 12.7 g/dL (ref 12.0–15.0)
Lymphs Abs: 1.6 10*3/uL (ref 0.7–4.0)
MCH: 32.6 pg (ref 26.0–34.0)
MCHC: 34.8 g/dL (ref 30.0–36.0)
Monocytes Absolute: 0.7 10*3/uL (ref 0.1–1.0)
Monocytes Relative: 11 % (ref 3–12)
Neutro Abs: 4.1 10*3/uL (ref 1.7–7.7)

## 2013-03-14 LAB — BASIC METABOLIC PANEL
CO2: 27 mEq/L (ref 19–32)
Chloride: 101 mEq/L (ref 96–112)
Sodium: 135 mEq/L (ref 135–145)

## 2013-03-14 MED ORDER — AMBULATORY NON FORMULARY MEDICATION: Status: DC

## 2013-03-14 NOTE — Progress Notes (Signed)
Subjective:    Patient ID: Leah Harper, female    DOB: January 27, 1920, 79 y.o.   MRN: [id number]  HPI  Leah Harper is a 79 yr old female who presents today for 1 week follow up from her cardiology visit. She was seen by Tereso Newcomer PA-c on 9/11.  Her diltiazem dose was decreased.  BMET revealed mild hypokalemia and mild anemia with a hgb of 11.8. She is continued on lasix.  She is maintained on eliquis for stroke prevention. Her weight has ranged form 109-114 over the last month and is 112 today.   Family notes that she does have some DOE.    She provided [place] with a urine sample on 9/12 which grew a cipro sensitive e. Coli.  Rx was provided for cipro.  Family noted that her energy level has been down.  Rash- husband noted about 1 week ago.  She has some pain with sitting due to rectal discomfort.  Her husband has put a "salve"  She does not have her pessary in place.      Review of Systems    see HPI  Past Medical History  Diagnosis Date  . Hyperlipidemia   . Hypertension   . AF (atrial fibrillation)   . Hyperthyroidism     remotely in 1941  . Normocytic anemia     2002  . S/P TAH-BSO (total abdominal hysterectomy and bilateral salpingo-oophorectomy)   . Allergy   . Arrhythmia   . Anxiety   . Dementia   . CHF (congestive heart failure)   . Kidney infection     history of   . GERD (gastroesophageal reflux disease)     History   Social History  . Marital Status: Married    Spouse Name: [name]    Number of Children: 3  . Years of Education: N/A   Occupational History  . retired    Social History Main Topics  . Smoking status: Former Smoker    Quit date: 07/17/1963  . Smokeless tobacco: Never Used  . Alcohol Use: No  . Drug Use: No  . Sexual Activity: Not on file   Other Topics Concern  . Not on file   Social History Narrative   Retired   Married -3 children    Tobacco abuse-no   Alcohol use-no     Regular exercise-no    Past Surgical History   Procedure Laterality Date  . Tonsillectomy    . Total abdominal hysterectomy w/ bilateral salpingoophorectomy  1986  . Bilateral salpingoophorectomy    . Bladder suspension      bladder tack with TAHBSO    Family History  Problem Relation Age of Onset  . Heart disease Father   . Hyperlipidemia Father   . Heart attack Father   . Heart disease Brother     heart attack  . Heart disease Brother     Allergies  Allergen Reactions  . Statins     Myalgia   . Tylenol [Acetaminophen]     Facial rash    Current Outpatient Prescriptions on File Prior to Visit  Medication Sig Dispense Refill  . ALPRAZolam (XANAX) 0.25 MG tablet Take 1 tablet (0.25 mg total) by mouth 2 (two) times daily as needed. For anxiety  60 tablet  0  . apixaban (ELIQUIS) 2.5 MG TABS tablet Take 1 tablet (2.5 mg total) by mouth 2 (two) times daily.  60 tablet  11  . Bacitracin-Polymyxin B (POLYSPORIN EX) Apply 1 application topically daily  as needed (outbreak on legs). cream      . ciprofloxacin (CIPRO) 250 MG tablet Take 1 tablet (250 mg total) by mouth 2 (two) times daily.  10 tablet  0  . diltiazem (CARDIZEM) 90 MG tablet Take 0.5 tablets (45 mg total) by mouth 2 (two) times daily.      Marland Kitchen ENSURE (ENSURE) Take 237 mLs by mouth 2 (two) times daily between meals.       Marland Kitchen ezetimibe (ZETIA) 10 MG tablet Take 1 tablet (10 mg total) by mouth daily.  90 tablet  1  . furosemide (LASIX) 20 MG tablet Take 1 tablet (20 mg total) by mouth daily as needed. Weight gain > 5 lbs  30 tablet  0  . megestrol (MEGACE) 40 MG/ML suspension Take 10 mLs (400 mg total) by mouth daily.  240 mL  3  . omeprazole (PRILOSEC) 20 MG capsule Take 20 mg by mouth daily as needed (acid reflux).       . rivastigmine (EXELON) 4.6 mg/24hr Place 1 patch onto the skin at bedtime.       No current facility-administered medications on file prior to visit.    BP 150/78  Pulse 102  Temp(Src) 97.9 F (36.6 C) (Oral)  Resp 18  Ht 5\' 4"  (1.626 m)  Wt  112 lb (50.803 kg)  BMI 19.22 kg/m2  SpO2 99%    Objective:   Physical Exam  Constitutional: She appears well-developed and well-nourished. No distress.  Cardiovascular: Normal rate.   No murmur heard. Pulmonary/Chest: Effort normal and breath sounds normal. No respiratory distress. She has no wheezes. She has no rales. She exhibits no tenderness.  Musculoskeletal:  1-2+ bilateral LE edema  Neurological: She is alert.  Memory impaired  skin : stage 1 sacral decub is noted.        Assessment & Plan:

## 2013-03-14 NOTE — Patient Instructions (Signed)
We will contact your home health agency to arrange special pads for your wound. Please let us know if you have not heard from them by the middle of next week. Apply restore dressing to redness above buttocks every 3 days.  Keep area clean and dry. Try to reposition every 1-2 hours to help prevent worsening of your bed sore. Complete lab work prior to leaving. Follow up in 2 weeks.

## 2013-03-16 ENCOUNTER — Encounter: Payer: Self-pay | Admitting: Family

## 2013-03-16 DIAGNOSIS — L89151 Pressure ulcer of sacral region, stage 1: Secondary | ICD-10-CM | POA: Insufficient documentation

## 2013-03-16 DIAGNOSIS — N390 Urinary tract infection, site not specified: Secondary | ICD-10-CM | POA: Insufficient documentation

## 2013-03-16 NOTE — Assessment & Plan Note (Signed)
Recommended frequent turning, ambulation, position changes. Restore dressing to sacrum to protect. Will request gel pad for chair and mattress overlay from her home health agency.

## 2013-03-16 NOTE — Assessment & Plan Note (Signed)
EKG today shows ?flutter.  Symptomatically stable. Rate is stable. Reviewed plan with Tereso Newcomer PA-c/  Seems to be tolerating decreased dose of diltiazem.

## 2013-03-16 NOTE — Assessment & Plan Note (Signed)
Clinically improving, complete cipro.

## 2013-04-02 ENCOUNTER — Encounter: Payer: Self-pay | Admitting: Family

## 2013-04-02 ENCOUNTER — Ambulatory Visit (INDEPENDENT_AMBULATORY_CARE_PROVIDER_SITE_OTHER): Payer: Medicare Other | Admitting: Family

## 2013-04-02 ENCOUNTER — Telehealth: Payer: Self-pay | Admitting: *Deleted

## 2013-04-02 VITALS — BP 124/86 | HR 155 | Temp 98.0°F | Resp 18 | Ht 64.0 in | Wt 115.0 lb

## 2013-04-02 DIAGNOSIS — L89109 Pressure ulcer of unspecified part of back, unspecified stage: Secondary | ICD-10-CM

## 2013-04-02 DIAGNOSIS — L8991 Pressure ulcer of unspecified site, stage 1: Secondary | ICD-10-CM

## 2013-04-02 DIAGNOSIS — I4891 Unspecified atrial fibrillation: Secondary | ICD-10-CM

## 2013-04-02 DIAGNOSIS — I509 Heart failure, unspecified: Secondary | ICD-10-CM

## 2013-04-02 DIAGNOSIS — R0602 Shortness of breath: Secondary | ICD-10-CM

## 2013-04-02 DIAGNOSIS — I5033 Acute on chronic diastolic (congestive) heart failure: Secondary | ICD-10-CM

## 2013-04-02 DIAGNOSIS — L89151 Pressure ulcer of sacral region, stage 1: Secondary | ICD-10-CM

## 2013-04-02 MED ORDER — MEGESTROL ACETATE 40 MG/ML PO SUSP: 400.0000 mg | Freq: Every day | ORAL | Status: DC

## 2013-04-02 MED ORDER — FUROSEMIDE 20 MG PO TABS: 20.0000 mg | ORAL_TABLET | Freq: Every day | ORAL | Status: DC | PRN

## 2013-04-02 MED ORDER — FUROSEMIDE 20 MG PO TABS: 40.0000 mg | ORAL_TABLET | Freq: Every day | ORAL | Status: DC

## 2013-04-02 NOTE — Patient Instructions (Signed)
Increase lasix to 40 mg (2 tabs) once daily. Increase cardizem back to 90 mg (full tab) twice daily.  Take 90 mg as soon as you get home.   Follow up in our office on Friday.

## 2013-04-02 NOTE — Assessment & Plan Note (Signed)
Deteriorated.   Case discussed with Dr. Charlton Haws- cardiologist.  He recommends increasing cardizem back to 90 mg twice daily.

## 2013-04-02 NOTE — Telephone Encounter (Signed)
Rx previously sent for furosemide 20mg  1 tablet daily. Provider increased directions to 2 tablets daily. Rx sent with new directions and spoke with pharmacist to cancel once daily directions.

## 2013-04-02 NOTE — Assessment & Plan Note (Signed)
Resolved

## 2013-04-02 NOTE — Assessment & Plan Note (Signed)
Secondary to AF with RVR.   Increase lasix from 20mg  to 40mg  once daily. Advised continued weights at home, follow up with [place] on Friday. Husband wishes to try to keep pt out of hospital if at all possible.  He is instructed thought, that if she develops further weight gain or shortness of breath he is to bring her to the ED.

## 2013-04-02 NOTE — Progress Notes (Signed)
Subjective:    Patient ID: Leah Harper, female    DOB: Feb 14, 1920, 79 y.o.   MRN: [id number]  HPI  Leah Harper is a 79 yr old female who presents today for 2 week follow up of grade 1 sacral ulcer.  He husband states that he was able to obtain duoderm dressing and applied the dressings.  He has been trying to keep patient active ad has placed a special cushion in her chair.   AF- Unfortunately she is tachycardic today.  She is maintained on eliquis and diltiazem.  Last visit with cardiology her diltiazem dose was decreased from 90 mg bid to 45 mg bid. She had been on bid lasix but most recently has been on once daily lasix 20mg .  Husband notes recent increased LE edema and that her weight is up 3 pounds since her last visit.      Review of Systems See HPI  Past Medical History  Diagnosis Date  . Hyperlipidemia   . Hypertension   . AF (atrial fibrillation)   . Hyperthyroidism     remotely in 1941  . Normocytic anemia     2002  . S/P TAH-BSO (total abdominal hysterectomy and bilateral salpingo-oophorectomy)   . Allergy   . Arrhythmia   . Anxiety   . Dementia   . CHF (congestive heart failure)   . Kidney infection     history of   . GERD (gastroesophageal reflux disease)     History   Social History  . Marital Status: Married    Spouse Name: [name]    Number of Children: 3  . Years of Education: N/A   Occupational History  . retired    Social History Main Topics  . Smoking status: Former Smoker    Quit date: 07/17/1963  . Smokeless tobacco: Never Used  . Alcohol Use: No  . Drug Use: No  . Sexual Activity: Not on file   Other Topics Concern  . Not on file   Social History Narrative   Retired   Married -3 children    Tobacco abuse-no   Alcohol use-no     Regular exercise-no    Past Surgical History  Procedure Laterality Date  . Tonsillectomy    . Total abdominal hysterectomy w/ bilateral salpingoophorectomy  1986  . Bilateral salpingoophorectomy     . Bladder suspension      bladder tack with TAHBSO    Family History  Problem Relation Age of Onset  . Heart disease Father   . Hyperlipidemia Father   . Heart attack Father   . Heart disease Brother     heart attack  . Heart disease Brother     Allergies  Allergen Reactions  . Statins     Myalgia   . Tylenol [Acetaminophen]     Facial rash    Current Outpatient Prescriptions on File Prior to Visit  Medication Sig Dispense Refill  . ALPRAZolam (XANAX) 0.25 MG tablet Take 1 tablet (0.25 mg total) by mouth 2 (two) times daily as needed. For anxiety  60 tablet  0  . AMBULATORY NON FORMULARY MEDICATION Restore dressing to sacrum every 3 days.  12 patch  1  . apixaban (ELIQUIS) 2.5 MG TABS tablet Take 1 tablet (2.5 mg total) by mouth 2 (two) times daily.  60 tablet  11  . Bacitracin-Polymyxin B (POLYSPORIN EX) Apply 1 application topically daily as needed (outbreak on legs). cream      . diltiazem (CARDIZEM) 90 MG tablet  Take 0.5 tablets (45 mg total) by mouth 2 (two) times daily.      Marland Kitchen ENSURE (ENSURE) Take 237 mLs by mouth 2 (two) times daily between meals.       Marland Kitchen ezetimibe (ZETIA) 10 MG tablet Take 1 tablet (10 mg total) by mouth daily.  90 tablet  1  . megestrol (MEGACE) 40 MG/ML suspension Take 10 mLs (400 mg total) by mouth daily.  240 mL  3  . omeprazole (PRILOSEC) 20 MG capsule Take 20 mg by mouth daily as needed (acid reflux).       . rivastigmine (EXELON) 4.6 mg/24hr Place 1 patch onto the skin at bedtime.       No current facility-administered medications on file prior to visit.    BP 124/86  Pulse 155  Temp(Src) 98 F (36.7 C) (Oral)  Resp 18  Ht 5\' 4"  (1.626 m)  Wt 115 lb (52.164 kg)  BMI 19.73 kg/m2  SpO2 96%       Objective:   Physical Exam  Constitutional: She appears well-developed and well-nourished.  HENT:  Head: Normocephalic and atraumatic.  Cardiovascular: An irregularly irregular rhythm present. Tachycardia present.   Pulmonary/Chest:  Effort normal and breath sounds normal. No respiratory distress. She has no wheezes. She has no rales. She exhibits no tenderness.  Neurological: She is alert.  Pleasant but confused.  Skin: Skin is warm and dry.  2-3+ bilateral LE edema is noted.  Resolution of stage I sacral ulcer.  Psychiatric: She has a normal mood and affect. Her behavior is normal. Judgment and thought content normal.          Assessment & Plan:

## 2013-04-04 ENCOUNTER — Ambulatory Visit (INDEPENDENT_AMBULATORY_CARE_PROVIDER_SITE_OTHER): Payer: Medicare Other | Admitting: Family

## 2013-04-04 ENCOUNTER — Encounter: Payer: Self-pay | Admitting: Family

## 2013-04-04 VITALS — BP 140/70 | HR 78 | Temp 97.7°F | Resp 20 | Ht 64.0 in | Wt 116.0 lb

## 2013-04-04 DIAGNOSIS — N289 Disorder of kidney and ureter, unspecified: Secondary | ICD-10-CM

## 2013-04-04 DIAGNOSIS — I509 Heart failure, unspecified: Secondary | ICD-10-CM

## 2013-04-04 DIAGNOSIS — I4891 Unspecified atrial fibrillation: Secondary | ICD-10-CM

## 2013-04-04 DIAGNOSIS — I5033 Acute on chronic diastolic (congestive) heart failure: Secondary | ICD-10-CM

## 2013-04-04 NOTE — Progress Notes (Signed)
Subjective:    Patient ID: Leah Harper, female    DOB: 1920/02/19, 79 y.o.   MRN: [id number]  HPI  Leah Harper is a 79 yr old female who presents today for follow up. She was seen on 10/8 in AF with RVR. Her cardizem was increased from 45 mg bid to 90 mg bid.  She was noted to be volume overloaded and her furosemide was increased from 20mg  daily to 40mg  daily.  Her weight is up one pound today from the last visit.  HR is down to 78.   Pt, who suffers from dementia, notes feeling "better." Husband notes that she is less short of breath with exertion today than she was on Wednesday.   Review of Systems See HPI  Past Medical History  Diagnosis Date  . Hyperlipidemia   . Hypertension   . AF (atrial fibrillation)   . Hyperthyroidism     remotely in 1941  . Normocytic anemia     2002  . S/P TAH-BSO (total abdominal hysterectomy and bilateral salpingo-oophorectomy)   . Allergy   . Arrhythmia   . Anxiety   . Dementia   . CHF (congestive heart failure)   . Kidney infection     history of   . GERD (gastroesophageal reflux disease)     History   Social History  . Marital Status: Married    Spouse Name: [name]    Number of Children: 3  . Years of Education: N/A   Occupational History  . retired    Social History Main Topics  . Smoking status: Former Smoker    Quit date: 07/17/1963  . Smokeless tobacco: Never Used  . Alcohol Use: No  . Drug Use: No  . Sexual Activity: Not on file   Other Topics Concern  . Not on file   Social History Narrative   Retired   Married -3 children    Tobacco abuse-no   Alcohol use-no     Regular exercise-no    Past Surgical History  Procedure Laterality Date  . Tonsillectomy    . Total abdominal hysterectomy w/ bilateral salpingoophorectomy  1986  . Bilateral salpingoophorectomy    . Bladder suspension      bladder tack with TAHBSO    Family History  Problem Relation Age of Onset  . Heart disease Father   . Hyperlipidemia  Father   . Heart attack Father   . Heart disease Brother     heart attack  . Heart disease Brother     Allergies  Allergen Reactions  . Statins     Myalgia   . Tylenol [Acetaminophen]     Facial rash    Current Outpatient Prescriptions on File Prior to Visit  Medication Sig Dispense Refill  . ALPRAZolam (XANAX) 0.25 MG tablet Take 1 tablet (0.25 mg total) by mouth 2 (two) times daily as needed. For anxiety  60 tablet  0  . AMBULATORY NON FORMULARY MEDICATION Restore dressing to sacrum every 3 days.  12 patch  1  . apixaban (ELIQUIS) 2.5 MG TABS tablet Take 1 tablet (2.5 mg total) by mouth 2 (two) times daily.  60 tablet  11  . Bacitracin-Polymyxin B (POLYSPORIN EX) Apply 1 application topically daily as needed (outbreak on legs). cream      . diltiazem (CARDIZEM) 90 MG tablet Take 90 mg by mouth 2 (two) times daily.      Marland Kitchen ENSURE (ENSURE) Take 237 mLs by mouth 2 (two) times daily between meals.       Marland Kitchen  ezetimibe (ZETIA) 10 MG tablet Take 1 tablet (10 mg total) by mouth daily.  90 tablet  1  . furosemide (LASIX) 20 MG tablet Take 2 tablets (40 mg total) by mouth daily. Weight gain > 5 lbs  60 tablet  3  . megestrol (MEGACE) 40 MG/ML suspension Take 10 mLs (400 mg total) by mouth daily.  240 mL  3  . omeprazole (PRILOSEC) 20 MG capsule Take 20 mg by mouth daily as needed (acid reflux).       . rivastigmine (EXELON) 4.6 mg/24hr Place 1 patch onto the skin at bedtime.       No current facility-administered medications on file prior to visit.    BP 140/70  Pulse 78  Temp(Src) 97.7 F (36.5 C) (Oral)  Resp 20  Ht 5\' 4"  (1.626 m)  Wt 116 lb (52.617 kg)  BMI 19.9 kg/m2  SpO2 98%       Objective:   Physical Exam  Constitutional: She appears well-developed and well-nourished. No distress.  HENT:  Head: Normocephalic and atraumatic.  Cardiovascular: Normal rate.  An irregular rhythm present.  No murmur heard. Pulmonary/Chest: Effort normal and breath sounds normal. No  respiratory distress. She has no wheezes. She has no rales. She exhibits no tenderness.  Musculoskeletal:  Bilateral LE edema 2-3+  Lymphadenopathy:    She has no cervical adenopathy.  Neurological: She is alert.  Skin: Skin is warm and dry.  Psychiatric: She has a normal mood and affect. Her behavior is normal. Judgment and thought content normal.          Assessment & Plan:

## 2013-04-04 NOTE — Patient Instructions (Signed)
Please keep your follow up with Dr. Eden Emms. Follow up with [place] in 6 weeks.

## 2013-04-04 NOTE — Assessment & Plan Note (Signed)
Weight up a pound, but clinically improved since HR is controlled. Continue lasix 40mg  daily. Has follow up with cardiology on Monday and I have instructed them to keep this appointment. Obtain bmet to assess renal function and K+.

## 2013-04-04 NOTE — Assessment & Plan Note (Signed)
Continue increased dose of cardizem.  Continue eliquis.

## 2013-04-05 LAB — BASIC METABOLIC PANEL
Calcium: 8.8 mg/dL (ref 8.4–10.5)
Creat: 2.33 mg/dL — ABNORMAL HIGH (ref 0.50–1.10)
Potassium: 4.2 mEq/L (ref 3.5–5.3)

## 2013-04-07 ENCOUNTER — Encounter (INDEPENDENT_AMBULATORY_CARE_PROVIDER_SITE_OTHER): Payer: Self-pay

## 2013-04-07 ENCOUNTER — Encounter: Payer: Self-pay | Admitting: Cardiovascular Disease

## 2013-04-07 ENCOUNTER — Ambulatory Visit (INDEPENDENT_AMBULATORY_CARE_PROVIDER_SITE_OTHER): Payer: Medicare Other | Admitting: Cardiovascular Disease

## 2013-04-07 VITALS — BP 146/80 | HR 74 | Wt 115.0 lb

## 2013-04-07 DIAGNOSIS — I10 Essential (primary) hypertension: Secondary | ICD-10-CM

## 2013-04-07 DIAGNOSIS — E785 Hyperlipidemia, unspecified: Secondary | ICD-10-CM

## 2013-04-07 DIAGNOSIS — I4891 Unspecified atrial fibrillation: Secondary | ICD-10-CM

## 2013-04-07 NOTE — Assessment & Plan Note (Signed)
Well controlled.  Continue current medications and low sodium Dash type diet.    

## 2013-04-07 NOTE — Patient Instructions (Signed)
Your physician wants you to follow-up in:   6 MONTHS WITH DR NISAHN You will receive a reminder letter in the mail two months in advance. If you don't receive a letter, please call our office to schedule the follow-up appointment.  Your physician recommends that you continue on your current medications as directed. Please refer to the Current Medication list given to you today.  

## 2013-04-07 NOTE — Progress Notes (Signed)
Patient ID: Leah Harper, female   DOB: 16-Aug-1919, 79 y.o.   MRN: [id number] Leah Harper is a 79 y.o. female who returns for follow up after a recent admission to the hospital.  She has a hx of HTN, HL, remote hypothyroidism, CKD. She has a hx of non-ischemic nuclear study in 2003. Echo 1/13: Mild LVH, EF 60%, aortic sclerosis without aortic stenosis, mild AI, question RV dysfunction, trivial pericardial effusion. Last seen by Dr. Eden Emms 2/13. I saw her in 2/14 with bradycardia there was fairly asymptomatic. Beta blocker therapy was discontinued at that time.  She was admitted 8/20-8/26 with a/c diastolic CHF in the setting of AFib with RVR. She was diuresed with IV Lasix. She had evidence of tachy-brady syndrome with IV Dilt and IV Metoprolol for rate control. She went in and out of AFib during her hospital stay and was ultimately d/c on short acting form of diltiazem to control HR. She was placed on Apixaban. Echo 02/14/13: Inferolateral HK, mild LVH, EF 50%, MAC, mild RAE.  Seen by primary last week and rate was up  Went back to  cardizem 90 bid but admitted to some non compliance with meds.  She has done well since taking cardizem as directed.  Today appears to be in sinus with low pulse  Dementia and hearing getting worse    ROS: Denies fever, malais, weight loss, blurry vision, decreased visual acuity, cough, sputum, SOB, hemoptysis, pleuritic pain, palpitaitons, heartburn, abdominal pain, melena, lower extremity edema, claudication, or rash.  All other systems reviewed and negative  General: Affect appropriate Healthy:  appears stated age HEENT: normal Neck supple with no adenopathy JVP normal no bruits no thyromegaly Lungs clear with no wheezing and good diaphragmatic motion Heart:  S1/S2 no murmur, no rub, gallop or click PMI normal Abdomen: benighn, BS positve, no tenderness, no AAA no bruit.  No HSM or HJR Distal pulses intact with no bruits No edema Neuro non-focal Skin  warm and dry No muscular weakness   Current Outpatient Prescriptions  Medication Sig Dispense Refill  . ALPRAZolam (XANAX) 0.25 MG tablet Take 1 tablet (0.25 mg total) by mouth 2 (two) times daily as needed. For anxiety  60 tablet  0  . AMBULATORY NON FORMULARY MEDICATION Restore dressing to sacrum every 3 days.  12 patch  1  . apixaban (ELIQUIS) 2.5 MG TABS tablet Take 1 tablet (2.5 mg total) by mouth 2 (two) times daily.  60 tablet  11  . Bacitracin-Polymyxin B (POLYSPORIN EX) Apply 1 application topically daily as needed (outbreak on legs). cream      . diltiazem (CARDIZEM) 90 MG tablet Take 90 mg by mouth 2 (two) times daily.      Marland Kitchen ENSURE (ENSURE) Take 237 mLs by mouth 2 (two) times daily between meals.       Marland Kitchen ezetimibe (ZETIA) 10 MG tablet Take 1 tablet (10 mg total) by mouth daily.  90 tablet  1  . furosemide (LASIX) 20 MG tablet Take 2 tablets (40 mg total) by mouth daily. Weight gain > 5 lbs  60 tablet  3  . megestrol (MEGACE) 40 MG/ML suspension Take 10 mLs (400 mg total) by mouth daily.  240 mL  3  . omeprazole (PRILOSEC) 20 MG capsule Take 20 mg by mouth daily as needed (acid reflux).       . rivastigmine (EXELON) 4.6 mg/24hr Place 1 patch onto the skin at bedtime.       No current facility-administered medications  for this visit.    Allergies  Statins and Tylenol  Electrocardiogram:  10/8  Fib/flutter rate 158  IVCD    Assessment and Plan

## 2013-04-07 NOTE — Assessment & Plan Note (Signed)
Cholesterol is at goal.  Continue current dose of statin and diet Rx.  No myalgias or side effects.  F/U  LFT's in 6 months. Lab Results  Component Value Date   LDLCALC 103* 03/15/2011

## 2013-04-07 NOTE — Assessment & Plan Note (Signed)
Continue Eliquis and cardizem  History of SSS  May need to add low dose amiodarone if she has frequent break through with rapid rates

## 2013-04-29 ENCOUNTER — Telehealth: Payer: Self-pay | Admitting: *Deleted

## 2013-04-29 NOTE — Telephone Encounter (Signed)
Noted. Agree.

## 2013-04-29 NOTE — Telephone Encounter (Signed)
Received message from pt's husband stating he thinks pt is having rectal bleeding or vaginal bleeding. He is not sure as he is seeing this on her depends x 1 week. Scheduled appt for 04/30/13 at 1:30pm. Pt's was advised to have pt evaluated in the ER if bleeding becomes severe.

## 2013-04-30 ENCOUNTER — Ambulatory Visit (INDEPENDENT_AMBULATORY_CARE_PROVIDER_SITE_OTHER): Payer: Medicare Other | Admitting: Family

## 2013-04-30 ENCOUNTER — Encounter: Payer: Self-pay | Admitting: Family

## 2013-04-30 ENCOUNTER — Telehealth: Payer: Self-pay | Admitting: *Deleted

## 2013-04-30 VITALS — BP 100/58 | HR 84 | Temp 98.6°F | Resp 16 | Ht 64.0 in | Wt 118.0 lb

## 2013-04-30 DIAGNOSIS — K644 Residual hemorrhoidal skin tags: Secondary | ICD-10-CM | POA: Insufficient documentation

## 2013-04-30 DIAGNOSIS — K625 Hemorrhage of anus and rectum: Secondary | ICD-10-CM

## 2013-04-30 LAB — CBC WITH DIFFERENTIAL/PLATELET
Basophils Absolute: 0 10*3/uL (ref 0.0–0.1)
Eosinophils Absolute: 0.2 10*3/uL (ref 0.0–0.7)
Lymphocytes Relative: 24 % (ref 12–46)
Lymphs Abs: 1.5 10*3/uL (ref 0.7–4.0)
MCH: 30.4 pg (ref 26.0–34.0)
Neutrophils Relative %: 63 % (ref 43–77)
Platelets: 284 10*3/uL (ref 150–400)
RBC: 4.08 MIL/uL (ref 3.87–5.11)
RDW: 13.6 % (ref 11.5–15.5)
WBC: 6.2 10*3/uL (ref 4.0–10.5)

## 2013-04-30 MED ORDER — HYDROCORTISONE ACETATE 25 MG RE SUPP: 25.0000 mg | Freq: Two times a day (BID) | RECTAL | Status: DC | PRN

## 2013-04-30 NOTE — Telephone Encounter (Signed)
Pt was seen in the office today and was unable to obtain urine. Attempted to reach pt's husband and received busy tone. Need to ask him to pick up a urine cup from our office to collect at home as soon as possible.

## 2013-04-30 NOTE — Assessment & Plan Note (Signed)
Will add anusol suppositories. Check IFOB, check CBC.  Follow up if symptoms worsen or if not improvement.

## 2013-04-30 NOTE — Progress Notes (Signed)
Subjective:    Patient ID: Leah Harper, female    DOB: 08-Mar-1920, 79 y.o.   MRN: [id number]  HPI  Leah Harper is a 79 yr old female who presents today with her husband who reports + blood noted in depends and toilet.  Husband reports that he first noticed blood in toilet 8 days ago and bleeding is intermittent.  Husband reports + associated constipation.  Husband reports that she wipes herself incessantly.  Last night had bright red blood on the tissue.   Review of Systems See HPI  Past Medical History  Diagnosis Date  . Hyperlipidemia   . Hypertension   . AF (atrial fibrillation)   . Hyperthyroidism     remotely in 1941  . Normocytic anemia     2002  . S/P TAH-BSO (total abdominal hysterectomy and bilateral salpingo-oophorectomy)   . Allergy   . Arrhythmia   . Anxiety   . Dementia   . CHF (congestive heart failure)   . Kidney infection     history of   . GERD (gastroesophageal reflux disease)     History   Social History  . Marital Status: Married    Spouse Name: Leah Harper    Number of Children: 3  . Years of Education: N/A   Occupational History  . retired    Social History Main Topics  . Smoking status: Former Smoker    Quit date: 07/17/1963  . Smokeless tobacco: Never Used  . Alcohol Use: No  . Drug Use: No  . Sexual Activity: Not on file   Other Topics Concern  . Not on file   Social History Narrative   Retired   Married -3 children    Tobacco abuse-no   Alcohol use-no     Regular exercise-no    Past Surgical History  Procedure Laterality Date  . Tonsillectomy    . Total abdominal hysterectomy w/ bilateral salpingoophorectomy  1986  . Bilateral salpingoophorectomy    . Bladder suspension      bladder tack with TAHBSO    Family History  Problem Relation Age of Onset  . Heart disease Father   . Hyperlipidemia Father   . Heart attack Father   . Heart disease Brother     heart attack  . Heart disease Brother     Allergies  Allergen  Reactions  . Exelon [Rivastigmine]     Patch burns her skin and is unable to tolerate it.  . Statins     Myalgia   . Tylenol [Acetaminophen]     Facial rash    Current Outpatient Prescriptions on File Prior to Visit  Medication Sig Dispense Refill  . ALPRAZolam (XANAX) 0.25 MG tablet Take 1 tablet (0.25 mg total) by mouth 2 (two) times daily as needed. For anxiety  60 tablet  0  . apixaban (ELIQUIS) 2.5 MG TABS tablet Take 1 tablet (2.5 mg total) by mouth 2 (two) times daily.  60 tablet  11  . Bacitracin-Polymyxin B (POLYSPORIN EX) Apply 1 application topically daily as needed (outbreak on legs). cream      . diltiazem (CARDIZEM) 90 MG tablet Take 90 mg by mouth 2 (two) times daily.      Marland Kitchen ENSURE (ENSURE) Take 237 mLs by mouth 2 (two) times daily between meals.       Marland Kitchen ezetimibe (ZETIA) 10 MG tablet Take 1 tablet (10 mg total) by mouth daily.  90 tablet  1  . furosemide (LASIX) 20 MG tablet Take 2  tablets (40 mg total) by mouth daily. Weight gain > 5 lbs  60 tablet  3  . omeprazole (PRILOSEC) 20 MG capsule Take 20 mg by mouth daily as needed (acid reflux).        No current facility-administered medications on file prior to visit.    BP 100/58  Pulse 84  Temp(Src) 98.6 F (37 C) (Oral)  Resp 16  Ht 5\' 4"  (1.626 m)  Wt 118 lb (53.524 kg)  BMI 20.24 kg/m2  SpO2 99%       Objective:   Physical Exam  Constitutional: She appears well-developed and well-nourished. No distress.  Cardiovascular: Normal rate and regular rhythm.   No murmur heard. Pulmonary/Chest:  Mild DOE is noted  Genitourinary:  Pelvic exam reveals uterine prolapse.  No visible vaginal bleeding is noted. + external and internal hemorrhoids are noted Perineum appears mildly irritated but skin is intact + blood stain noted in depends  Neurological:  Confused but pleasant          Assessment & Plan:

## 2013-04-30 NOTE — Patient Instructions (Addendum)
Use the anusol suppositories once daily for the next 1 week. Call if bleeding worsens or if bleeding does not resolve. Please complete lab work prior to leaving. Complete the stool kit and return. Follow up in 1 month.  Call sooner if increased bleeding.

## 2013-05-01 ENCOUNTER — Encounter: Payer: Self-pay | Admitting: Family

## 2013-05-01 NOTE — Telephone Encounter (Signed)
Spoke with Leah Harper RE: urine specimen and the need to have this done at their earliest convenience, reports that he will p/u specimen cup after lunch today; informed that specimen needs to be refrigerated w/i one-hour of dispensing and returned to lab w/i 24 hours of collection. Pt's spouse states understood & agreed; left materials at front desk/SLS

## 2013-05-04 LAB — URINE CULTURE: Colony Count: >=100000

## 2013-05-05 ENCOUNTER — Telehealth: Payer: Self-pay | Admitting: Family

## 2013-05-05 MED ORDER — CIPROFLOXACIN HCL 250 MG PO TABS: 250.0000 mg | ORAL_TABLET | Freq: Two times a day (BID) | ORAL | Status: DC

## 2013-05-05 NOTE — Telephone Encounter (Signed)
Notified caregiver, Boneta Lucks.

## 2013-05-05 NOTE — Telephone Encounter (Signed)
Urine culture + for bacteria.  Rx with cipro x 3 days.

## 2013-05-07 ENCOUNTER — Telehealth: Payer: Self-pay | Admitting: Family

## 2013-05-07 MED ORDER — HYDROCORTISONE ACE-PRAMOXINE 1-1 % RE FOAM: 1.0000 | Freq: Two times a day (BID) | RECTAL | Status: DC

## 2013-05-07 NOTE — Telephone Encounter (Signed)
Patient husband states that patient is still bleeding. He wants to know what Melissa recommends she do? He says that it is not heavy bleeding.

## 2013-05-07 NOTE — Telephone Encounter (Signed)
Trial of proctofoam HC- A different prep for hemorrhoids.  Keep apt on 11/24 unless bleeding is heavy. Then she would need to be seen sooner.

## 2013-05-07 NOTE — Telephone Encounter (Signed)
Pt has appt on 05/19/13. Do you want her to be seen sooner?

## 2013-05-08 ENCOUNTER — Ambulatory Visit (INDEPENDENT_AMBULATORY_CARE_PROVIDER_SITE_OTHER): Payer: Medicare Other | Admitting: Family Medicine

## 2013-05-08 ENCOUNTER — Other Ambulatory Visit: Payer: Medicare Other

## 2013-05-08 VITALS — BP 135/73 | HR 80 | Temp 97.4°F | Resp 20 | Ht 64.0 in | Wt 119.5 lb

## 2013-05-08 DIAGNOSIS — N760 Acute vaginitis: Secondary | ICD-10-CM

## 2013-05-08 DIAGNOSIS — K625 Hemorrhage of anus and rectum: Secondary | ICD-10-CM

## 2013-05-08 DIAGNOSIS — K5900 Constipation, unspecified: Secondary | ICD-10-CM

## 2013-05-08 DIAGNOSIS — R300 Dysuria: Secondary | ICD-10-CM

## 2013-05-08 DIAGNOSIS — R109 Unspecified abdominal pain: Secondary | ICD-10-CM

## 2013-05-08 DIAGNOSIS — N390 Urinary tract infection, site not specified: Secondary | ICD-10-CM

## 2013-05-08 DIAGNOSIS — I10 Essential (primary) hypertension: Secondary | ICD-10-CM

## 2013-05-08 DIAGNOSIS — I4891 Unspecified atrial fibrillation: Secondary | ICD-10-CM

## 2013-05-08 DIAGNOSIS — N289 Disorder of kidney and ureter, unspecified: Secondary | ICD-10-CM

## 2013-05-08 LAB — RENAL FUNCTION PANEL
Albumin: 3.3 g/dL — ABNORMAL LOW (ref 3.5–5.2)
Calcium: 9.2 mg/dL (ref 8.4–10.5)
Chloride: 103 mEq/L (ref 96–112)
Phosphorus: 3 mg/dL (ref 2.3–4.6)
Potassium: 4 mEq/L (ref 3.5–5.3)
Sodium: 136 mEq/L (ref 135–145)

## 2013-05-08 LAB — HEPATIC FUNCTION PANEL
ALT: 11 U/L (ref 0–35)
AST: 22 U/L (ref 0–37)
Albumin: 3.3 g/dL — ABNORMAL LOW (ref 3.5–5.2)
Alkaline Phosphatase: 85 U/L (ref 39–117)
Bilirubin, Direct: 0.1 mg/dL (ref 0.0–0.3)
Indirect Bilirubin: 0.4 mg/dL (ref 0.0–0.9)
Total Bilirubin: 0.5 mg/dL (ref 0.3–1.2)
Total Protein: 6.9 g/dL (ref 6.0–8.3)

## 2013-05-08 LAB — CBC
Hemoglobin: 12.9 g/dL (ref 12.0–15.0)
MCHC: 34.3 g/dL (ref 30.0–36.0)
Platelets: 323 10*3/uL (ref 150–400)
RDW: 13.8 % (ref 11.5–15.5)
WBC: 6.6 10*3/uL (ref 4.0–10.5)

## 2013-05-08 LAB — SEDIMENTATION RATE: Sed Rate: 116 mm/hr — ABNORMAL HIGH (ref 0–22)

## 2013-05-08 MED ORDER — HYDROCORTISONE ACE-PRAMOXINE 1-1 % RE FOAM: 1.0000 | Freq: Three times a day (TID) | RECTAL | Status: DC

## 2013-05-08 MED ORDER — DOCUSATE SODIUM 100 MG PO CAPS: 100.0000 mg | ORAL_CAPSULE | Freq: Every day | ORAL | Status: DC

## 2013-05-08 MED ORDER — FLUCONAZOLE 150 MG PO TABS: 150.0000 mg | ORAL_TABLET | Freq: Every day | ORAL | Status: DC

## 2013-05-08 NOTE — Patient Instructions (Signed)
  Prune juice 4 oz warm with 2 tbls milk of magnesia, daily or every other day, if no response can repeat in 4 hours with a glycerin suppository per rectum at the same time Constipation, Adult Constipation is when a person has fewer than 3 bowel movements a week; has difficulty having a bowel movement; or has stools that are dry, hard, or larger than normal. As people grow older, constipation is more common. If you try to fix constipation with medicines that make you have a bowel movement (laxatives), the problem may get worse. Long-term laxative use may cause the muscles of the colon to become weak. A low-fiber diet, not taking in enough fluids, and taking certain medicines may make constipation worse. CAUSES   Certain medicines, such as antidepressants, pain medicine, iron supplements, antacids, and water pills.   Certain diseases, such as diabetes, irritable bowel syndrome (IBS), thyroid disease, or depression.   Not drinking enough water.   Not eating enough fiber-rich foods.   Stress or travel.  Lack of physical activity or exercise.  Not going to the restroom when there is the urge to have a bowel movement.  Ignoring the urge to have a bowel movement.  Using laxatives too much. SYMPTOMS   Having fewer than 3 bowel movements a week.   Straining to have a bowel movement.   Having hard, dry, or larger than normal stools.   Feeling full or bloated.   Pain in the lower abdomen.  Not feeling relief after having a bowel movement. DIAGNOSIS  Your caregiver will take a medical history and perform a physical exam. Further testing may be done for severe constipation. Some tests may include:   A barium enema X-ray to examine your rectum, colon, and sometimes, your small intestine.  A sigmoidoscopy to examine your lower colon.  A colonoscopy to examine your entire colon. TREATMENT  Treatment will depend on the severity of your constipation and what is causing it. Some  dietary treatments include drinking more fluids and eating more fiber-rich foods. Lifestyle treatments may include regular exercise. If these diet and lifestyle recommendations do not help, your caregiver may recommend taking over-the-counter laxative medicines to help you have bowel movements. Prescription medicines may be prescribed if over-the-counter medicines do not work.  HOME CARE INSTRUCTIONS   Increase dietary fiber in your diet, such as fruits, vegetables, whole grains, and beans. Limit high-fat and processed sugars in your diet, such as [place] fries, hamburgers, cookies, candies, and soda.   A fiber supplement may be added to your diet if you cannot get enough fiber from foods.   Drink enough fluids to keep your urine clear or pale yellow.   Exercise regularly or as directed by your caregiver.   Go to the restroom when you have the urge to go. Do not hold it.  Only take medicines as directed by your caregiver. Do not take other medicines for constipation without talking to your caregiver first. SEEK IMMEDIATE MEDICAL CARE IF:   You have bright red blood in your stool.   Your constipation lasts for more than 4 days or gets worse.   You have abdominal or rectal pain.   You have thin, pencil-like stools.  You have unexplained weight loss. MAKE SURE YOU:   Understand these instructions.  Will watch your condition.  Will get help right away if you are not doing well or get worse. Document Released: 03/10/2004 Document Revised: 09/04/2011 Document Reviewed: 05/16/2011 [facility] Patient Information 2014 [place], [place].

## 2013-05-08 NOTE — Progress Notes (Signed)
Pre visit review using our clinic review tool, if applicable. No additional management support is needed unless otherwise documented below in the visit note/SLS  

## 2013-05-08 NOTE — Telephone Encounter (Signed)
Patient's spouse informed; pt issue is worse, pt has appt scheduled at 2:00p/SLS

## 2013-05-09 ENCOUNTER — Encounter: Payer: Self-pay | Admitting: Physician Assistant

## 2013-05-09 ENCOUNTER — Ambulatory Visit (INDEPENDENT_AMBULATORY_CARE_PROVIDER_SITE_OTHER): Payer: Medicare Other | Admitting: Physician Assistant

## 2013-05-09 ENCOUNTER — Telehealth: Payer: Self-pay | Admitting: *Deleted

## 2013-05-09 ENCOUNTER — Telehealth: Payer: Self-pay

## 2013-05-09 ENCOUNTER — Other Ambulatory Visit (INDEPENDENT_AMBULATORY_CARE_PROVIDER_SITE_OTHER): Payer: Medicare Other

## 2013-05-09 VITALS — BP 106/70 | HR 76 | Ht 64.0 in | Wt 117.8 lb

## 2013-05-09 DIAGNOSIS — K625 Hemorrhage of anus and rectum: Secondary | ICD-10-CM

## 2013-05-09 DIAGNOSIS — R1031 Right lower quadrant pain: Secondary | ICD-10-CM

## 2013-05-09 DIAGNOSIS — R1032 Left lower quadrant pain: Secondary | ICD-10-CM

## 2013-05-09 DIAGNOSIS — N8110 Cystocele, unspecified: Secondary | ICD-10-CM

## 2013-05-09 DIAGNOSIS — G8929 Other chronic pain: Secondary | ICD-10-CM

## 2013-05-09 DIAGNOSIS — N390 Urinary tract infection, site not specified: Secondary | ICD-10-CM

## 2013-05-09 DIAGNOSIS — N8111 Cystocele, midline: Secondary | ICD-10-CM

## 2013-05-09 LAB — FECAL OCCULT BLOOD, IMMUNOCHEMICAL: Fecal Occult Bld: POSITIVE

## 2013-05-09 NOTE — Telephone Encounter (Signed)
Message copied by Kathi Simpers on Fri May 09, 2013  1:36 PM ------      Message from: Dedra Skeens      Created: Fri May 09, 2013  1:16 PM       Please call [place] at [place] lab [phone]            The lab needs an order for IFOB kit ------

## 2013-05-09 NOTE — Telephone Encounter (Signed)
Leah Harper states she cancelled pts urinalysis yesterday because pt couldn't go to the bathroom? Urine culture wasn't discontinued though? pts spouse brought urine sample today so Leah needs new Urinalysis order.   Order put in

## 2013-05-09 NOTE — Telephone Encounter (Signed)
Order entered

## 2013-05-09 NOTE — Progress Notes (Signed)
Subjective:    Patient ID: Leah Harper, female    DOB: 05-11-1920, 79 y.o.   MRN: [id number]  HPI  Leah Harper  is a 79 year old white female, new to GI today referred by Dr. Rogelia Rohrer  for evaluation of rectal pain and bleeding. Patient has history of dementia, atrial fibrillation for which she is on LS, hypertension, congestive heart failure with EF of 50% and chronic kidney disease. Her family feels that she did have one remote colonoscopy but we do not have those records. Her husband relates that she has had problems with a bladder prolapse for several years and has used a pessary but that she has had significant problems with prolapse recently which has been very uncomfortable for her and they have not been very successful getting a pessary and do to discomfort. She is uncomfortable with sitting and apparently has been complaining of a heaviness in her lower abdomen and pressure. She's also had significant constipation over the past 3 weeks and complains of soreness and pain in her rectum. They have been noticing blood with her bowel movements which has been bright red and she complains of rectal pain with bowel movements as well. Patient's husband says she's very fastidious with tissue etc. and wonders if she is irritating things. She's been seen by primary care and prescribed Anusol suppositories and ProctoFoam which she  finds painful and has refused the past couple of days. They have just started MiraLax but have not noticed any results yet with first dose yesterday. Family feels that she has declined over the past couple of weeks is less ambulatory etc. but are aware that she is in pain and  That may be part of the reason. Most recent labs on 04/30/2013 hemoglobin of 12.4 hematocrit of 36.4 sedimentation rate markedly elevated at 116. Most recent creatinine was 2.3     Review of Systems  Constitutional: Positive for activity change and fatigue.  HENT: Negative.   Eyes: Negative.    Respiratory: Negative.   Gastrointestinal: Positive for abdominal pain, constipation, anal bleeding and rectal pain.  Endocrine: Negative.   Genitourinary: Positive for pelvic pain.  Musculoskeletal: Negative.   Skin: Negative.   Allergic/Immunologic: Negative.   Neurological: Negative.   Hematological: Negative.   Psychiatric/Behavioral: Negative.    Outpatient Prescriptions Prior to Visit  Medication Sig Dispense Refill  . ALPRAZolam (XANAX) 0.25 MG tablet Take 1 tablet (0.25 mg total) by mouth 2 (two) times daily as needed. For anxiety  60 tablet  0  . apixaban (ELIQUIS) 2.5 MG TABS tablet Take 1 tablet (2.5 mg total) by mouth 2 (two) times daily.  60 tablet  11  . Bacitracin-Polymyxin B (POLYSPORIN EX) Apply 1 application topically daily as needed (outbreak on legs). cream      . diltiazem (CARDIZEM) 90 MG tablet Take 90 mg by mouth 2 (two) times daily.      Marland Kitchen docusate sodium (COLACE) 100 MG capsule Take 1 capsule (100 mg total) by mouth daily.  30 capsule  1  . ENSURE (ENSURE) Take 237 mLs by mouth 2 (two) times daily between meals.       Marland Kitchen ezetimibe (ZETIA) 10 MG tablet Take 1 tablet (10 mg total) by mouth daily.  90 tablet  1  . fluconazole (DIFLUCAN) 150 MG tablet Take 1 tablet (150 mg total) by mouth daily.  3 tablet  0  . furosemide (LASIX) 20 MG tablet Take 2 tablets (40 mg total) by mouth daily. Weight gain > 5 lbs  60 tablet  3  . hydrocortisone (ANUSOL-HC) 25 MG suppository Place 1 suppository (25 mg total) rectally 2 (two) times daily as needed for hemorrhoids or itching.  12 suppository  0  . hydrocortisone-pramoxine (PROCTOFOAM HC) rectal foam Place 1 applicator rectally 2 (two) times daily.  10 g  0  . hydrocortisone-pramoxine (PROCTOFOAM HC) rectal foam Place 1 applicator rectally 3 (three) times daily.  10 g  1  . omeprazole (PRILOSEC) 20 MG capsule Take 20 mg by mouth daily as needed (acid reflux).        No facility-administered medications prior to visit.    Allergies  Allergen Reactions  . Exelon [Rivastigmine]     Patch burns her skin and is unable to tolerate it.  . Statins     Myalgia   . Tylenol [Acetaminophen]     Facial rash   Patient Active Problem List   Diagnosis Date Noted  . External hemorrhoid 04/30/2013  . UTI (urinary tract infection) 03/16/2013  . Chronic anticoagulation 02/18/2013  . Chronic kidney disease, stage IV (severe) 02/18/2013  . Acute on chronic diastolic CHF (congestive heart failure), NYHA class 4 02/18/2013  . Atrial fibrillation with rapid ventricular response 02/12/2013  . Bilateral hip pain 02/03/2013  . Right hip pain 01/28/2013  . CHF exacerbation 01/28/2013  . Other malaise and fatigue 01/28/2013  . Diverticulitis 12/24/2012  . Edema 11/15/2012  . Weakness 09/02/2012  . Abnormal EKG 07/30/2012  . Depression 04/15/2012  . Weight loss 07/17/2011  . Dementia 07/17/2011  . Chronic kidney disease (CKD), stage III (moderate) 07/17/2011  . Normocytic anemia 03/15/2011  . Anemia 03/15/2011  . Skin lesion of back 03/15/2011  . Mild depression 11/02/2010  . Abdominal aortic aneurysm 10/14/2010  . Renal insufficiency 10/05/2010  . Memory loss 10/05/2010  . Anorexia 10/05/2010  . ATYPICAL DEPRESSIVE DISORDER 08/02/2010  . CAROTID BRUIT 08/25/2009  . PERS HX NONCOMPLIANCE W/MED TX PRS HAZARDS HLTH 08/25/2009  . ANXIETY DEPRESSION 10/08/2008  . MIXED HEARING LOSS BILATERAL 10/08/2008  . HYPERLIPIDEMIA-MIXED 07/07/2008  . HYPERTENSION 07/07/2008  . Atrial fibrillation 07/07/2008  . CHEST PAIN, ATYPICAL, HX OF 07/07/2008   History  Substance Use Topics  . Smoking status: Former Smoker    Quit date: 07/17/1963  . Smokeless tobacco: Never Used  . Alcohol Use: No       family history includes Heart attack in her father; Heart disease in her brother, brother, and father; Hyperlipidemia in her father.  Objective:   Physical Exam well-developed frail-appearing elderly white female in no acute  distress accompanied by her daughter and husband. Sitting in a wheelchair She is able to get up on the exam table . Blood pressure 106/70 pulse 76 height 5 foot 4 weight 117. HEENT; nontraumatic normocephalic EOMI PERRLA sclera anicteric, Supple; no JVD, Cardiovascular; irregular rate and rhythm with S1-S2 no murmur or gallop, Pulmonary ;clear bilaterally, Abdomen; soft nondistended bowel sounds are present is very minimally tender in the lower abdomen there is no palpable mass or hepatosplenomegaly, Rectal; no current evidence of bladder prolapse from the vagina, exterior anus unremarkable. On digital exam she is very uncomfortable and there is a small amount of mucoid heme on the glove. On anoscopy she has an area of friability and inflammation which is soft and does not appear hemorrhoidal., Extremities ;no clubbing cyanosis or edema skin warm and dry, Psych; is hard of hearing but answers appropriately        Assessment & Plan:  #8   79 year old  female with new onset lower abdominal pain, constipation and rectal bleeding with abnormal anoscopy with friability and oozing which does not appear to be hemorrhoidal, etiology unclear- no fixed firmness felt, but am concerned she may have a neoplasm or other significant inflammatory process. #2 chronic bladder prolapse complicating above #3 chronic anticoagulation complicating above #4 atrial fibrillation #5 chronic renal insufficiency #6 dementia #7 markedly elevated sedimentation rate of unclear etiology  Plan; discussed with patient and family at length. Will check CT scan of the abdomen and pelvis with oral but no IV contrast initially as this will be less invasive. Patient's husband not sure how aggressive he wants to be within a workup but is aware she may need at least a sigmoidoscopy depending on CT findings. Stop suppositories and ProctoFoam as not helpful and causing discomfort. Offered an analgesic which they declined at this  point. Further plans pending findings of CT which will be scheduled for early next week

## 2013-05-09 NOTE — Patient Instructions (Signed)
Take Miralax 17 grams, 1 dose, daily with juice or water. Stop the suppositories.   You have been scheduled for a CT scan of the abdomen and pelvis at [facility] ([address]---this is in the same building as [facility]).   You are scheduled on Monday 05-12-2013 at 3:30 PM . You should arrive at 3:15 PM  prior to your appointment time for registration. Please follow the written instructions below on the day of your exam:  WARNING: IF YOU ARE ALLERGIC TO IODINE/X-RAY DYE, PLEASE NOTIFY RADIOLOGY IMMEDIATELY AT [phone]! YOU WILL BE GIVEN A 13 HOUR PREMEDICATION PREP.  1) Do not eat or drink anything after 11:30 AM  (4 hours prior to your test) 2) You have been given 2 bottles of oral contrast to drink. The solution may taste better if refrigerated, but do NOT add ice or any other liquid to this solution. Shake well before drinking.    Drink 1 bottle of contrast @ 1:30 PM  (2 hours prior to your exam)   Drink 1 bottle of contrast @ 2:30 PM  (1 hour prior to your exam)  You may take any medications as prescribed with a small amount of water except for the following: Metformin, Glucophage, Glucovance, Avandamet, Riomet, Fortamet, Actoplus Met, Janumet, Glumetza or Metaglip. The above medications must be held the day of the exam AND 48 hours after the exam.  The purpose of you drinking the oral contrast is to aid in the visualization of your intestinal tract. The contrast solution may cause some diarrhea. Before your exam is started, you will be given a small amount of fluid to drink. Depending on your individual set of symptoms, you may also receive an intravenous injection of x-ray contrast/dye. Plan on being at [facility] for 30 minutes or long, depending on the type of exam you are having performed.  If you have any questions regarding your exam or if you need to reschedule, you may call the CT department at [phone] between the hours of 8:00 am and 5:00  pm, Monday-Friday.  ________________________________________________________________________

## 2013-05-10 LAB — URINALYSIS, MICROSCOPIC ONLY
Bacteria, UA: NONE SEEN
Crystals: NONE SEEN

## 2013-05-10 LAB — URINALYSIS, ROUTINE W REFLEX MICROSCOPIC
Bilirubin Urine: NEGATIVE
Ketones, ur: NEGATIVE mg/dL
Protein, ur: 30 mg/dL — AB
Urobilinogen, UA: 0.2 mg/dL (ref 0.0–1.0)

## 2013-05-10 LAB — URINE CULTURE: Organism ID, Bacteria: 2000

## 2013-05-11 ENCOUNTER — Encounter: Payer: Self-pay | Admitting: Family Medicine

## 2013-05-11 DIAGNOSIS — K625 Hemorrhage of anus and rectum: Secondary | ICD-10-CM | POA: Insufficient documentation

## 2013-05-11 HISTORY — DX: Hemorrhage of anus and rectum: K62.5

## 2013-05-11 NOTE — Assessment & Plan Note (Signed)
On cipro

## 2013-05-11 NOTE — Assessment & Plan Note (Signed)
Adequate control of bp today no changes.

## 2013-05-11 NOTE — Assessment & Plan Note (Signed)
Rate controlled 

## 2013-05-11 NOTE — Assessment & Plan Note (Signed)
Did not tolerate suppositories. procto foam given, set up with gastroenterology, due to hi sed rate encouraged to proceed with CT scan

## 2013-05-11 NOTE — Progress Notes (Signed)
Patient ID: Leah Harper, female   DOB: 1920/02/05, 79 y.o.   MRN: [id number] Leah Harper [id number] [birth year] 05/11/2013      Progress Note-Follow Up  Subjective  Chief Complaint  Chief Complaint  Patient presents with  . Rectal Bleeding    Pt c/o continued rectal bleeding [hemorrhoids & bladder prolapsed]    HPI  Patient is a 79 yo caucasian femalin in today Complaining of abdominal and pelvic pain accompanied by her husband. She has a long history of hemorrhoids and pelvic floor prolapse but her bleeding and pain have recently worsened significantly. She's been using rectal suppositories but it is causing burning. She has a lot of irritation. She has rectal bleeding daily. They report urinary incontinence dysuria incontinence despite being on Cipro. No fevers or chills but she does have some anorexia and nausea. She does not presently have a gastroenterologist or a urologist. No CP, palp, sob  Past Medical History  Diagnosis Date  . Hyperlipidemia   . Hypertension   . AF (atrial fibrillation)   . Hyperthyroidism     remotely in 1941  . Normocytic anemia     2002  . S/P TAH-BSO (total abdominal hysterectomy and bilateral salpingo-oophorectomy)   . Allergy   . Arrhythmia   . Anxiety   . Dementia   . CHF (congestive heart failure)   . Kidney infection     history of   . GERD (gastroesophageal reflux disease)   . Rectal bleeding 05/11/2013    Past Surgical History  Procedure Laterality Date  . Tonsillectomy    . Total abdominal hysterectomy w/ bilateral salpingoophorectomy  1986  . Bilateral salpingoophorectomy    . Bladder suspension      bladder tack with TAHBSO    Family History  Problem Relation Age of Onset  . Heart disease Father   . Hyperlipidemia Father   . Heart attack Father   . Heart disease Brother     heart attack  . Heart disease Brother     History   Social History  . Marital Status: Married    Spouse Name: [name]    Number of  Children: 3  . Years of Education: N/A   Occupational History  . retired    Social History Main Topics  . Smoking status: Former Smoker    Quit date: 07/17/1963  . Smokeless tobacco: Never Used  . Alcohol Use: No  . Drug Use: No  . Sexual Activity: Not on file   Other Topics Concern  . Not on file   Social History Narrative   Retired   Married -3 children    Tobacco abuse-no   Alcohol use-no     Regular exercise-no    Current Outpatient Prescriptions on File Prior to Visit  Medication Sig Dispense Refill  . ALPRAZolam (XANAX) 0.25 MG tablet Take 1 tablet (0.25 mg total) by mouth 2 (two) times daily as needed. For anxiety  60 tablet  0  . apixaban (ELIQUIS) 2.5 MG TABS tablet Take 1 tablet (2.5 mg total) by mouth 2 (two) times daily.  60 tablet  11  . Bacitracin-Polymyxin B (POLYSPORIN EX) Apply 1 application topically daily as needed (outbreak on legs). cream      . diltiazem (CARDIZEM) 90 MG tablet Take 90 mg by mouth 2 (two) times daily.      Marland Kitchen ENSURE (ENSURE) Take 237 mLs by mouth 2 (two) times daily between meals.       Marland Kitchen ezetimibe (ZETIA) 10  MG tablet Take 1 tablet (10 mg total) by mouth daily.  90 tablet  1  . furosemide (LASIX) 20 MG tablet Take 2 tablets (40 mg total) by mouth daily. Weight gain > 5 lbs  60 tablet  3  . hydrocortisone (ANUSOL-HC) 25 MG suppository Place 1 suppository (25 mg total) rectally 2 (two) times daily as needed for hemorrhoids or itching.  12 suppository  0  . hydrocortisone-pramoxine (PROCTOFOAM HC) rectal foam Place 1 applicator rectally 2 (two) times daily.  10 g  0  . omeprazole (PRILOSEC) 20 MG capsule Take 20 mg by mouth daily as needed (acid reflux).        No current facility-administered medications on file prior to visit.    Allergies  Allergen Reactions  . Exelon [Rivastigmine]     Patch burns her skin and is unable to tolerate it.  . Statins     Myalgia   . Tylenol [Acetaminophen]     Facial rash    Review of  Systems  Review of Systems  Constitutional: Negative for fever and malaise/fatigue.  HENT: Negative for congestion.   Eyes: Positive for photophobia. Negative for discharge.  Respiratory: Negative for shortness of breath.   Cardiovascular: Negative for chest pain, palpitations and leg swelling.  Gastrointestinal: Positive for abdominal pain and blood in stool. Negative for nausea and diarrhea.  Genitourinary: Positive for dysuria, urgency and frequency. Negative for hematuria.  Musculoskeletal: Positive for myalgias. Negative for falls.  Skin: Negative for rash.  Neurological: Negative for loss of consciousness and headaches.  Endo/Heme/Allergies: Negative for polydipsia.  Psychiatric/Behavioral: Negative for depression and suicidal ideas. The patient is not nervous/anxious and does not have insomnia.     Objective  BP 135/73  Pulse 80  Temp(Src) 97.4 F (36.3 C) (Oral)  Resp 20  Ht 5\' 4"  (1.626 m)  Wt 119 lb 8 oz (54.205 kg)  BMI 20.50 kg/m2  SpO2 98%  Physical Exam  Physical Exam  Constitutional: She is oriented to person, place, and time and well-developed, well-nourished, and in no distress. No distress.  frail  HENT:  Head: Normocephalic and atraumatic.  Eyes: Conjunctivae are normal.  Neck: Neck supple. No thyromegaly present.  Cardiovascular: Normal rate, regular rhythm and normal heart sounds.   No murmur heard. Pulmonary/Chest: Effort normal and breath sounds normal. She has no wheezes.  Abdominal: She exhibits no distension and no mass.  Musculoskeletal: She exhibits no edema.  Lymphadenopathy:    She has no cervical adenopathy.  Neurological: She is alert and oriented to person, place, and time.  Skin: Skin is warm and dry. No rash noted. She is not diaphoretic.  Psychiatric: Memory, affect and judgment normal.    Lab Results  Component Value Date   TSH 1.888 02/12/2013   Lab Results  Component Value Date   WBC 6.6 05/08/2013   HGB 12.9 05/08/2013    HCT 37.6 05/08/2013   MCV 89.3 05/08/2013   PLT 323 05/08/2013   Lab Results  Component Value Date   CREATININE 2.08* 05/08/2013   BUN 33* 05/08/2013   NA 136 05/08/2013   K 4.0 05/08/2013   CL 103 05/08/2013   CO2 21 05/08/2013   Lab Results  Component Value Date   ALT 11 05/08/2013   AST 22 05/08/2013   ALKPHOS 85 05/08/2013   BILITOT 0.5 05/08/2013   Lab Results  Component Value Date   CHOL 186 03/15/2011   Lab Results  Component Value Date   HDL 58  03/15/2011   Lab Results  Component Value Date   LDLCALC 103* 03/15/2011   Lab Results  Component Value Date   TRIG 125 03/15/2011   Lab Results  Component Value Date   CHOLHDL 3.2 03/15/2011     Assessment & Plan  HYPERTENSION Adequate control of bp today no changes.  Renal insufficiency Stable, creatinine of 2.0  Atrial fibrillation Rate controlled  UTI (urinary tract infection) On cipro  Rectal bleeding Did not tolerate suppositories. procto foam given, set up with gastroenterology, due to hi sed rate encouraged to proceed with CT scan

## 2013-05-11 NOTE — Assessment & Plan Note (Signed)
Stable, creatinine of 2.0

## 2013-05-12 ENCOUNTER — Ambulatory Visit (INDEPENDENT_AMBULATORY_CARE_PROVIDER_SITE_OTHER)
Admission: RE | Admit: 2013-05-12 | Discharge: 2013-05-12 | Disposition: A | Discharge status: Home / Self Care | Payer: Medicare Other | Source: Ambulatory Visit | Attending: Physician Assistant | Admitting: Physician Assistant

## 2013-05-12 DIAGNOSIS — N8111 Cystocele, midline: Secondary | ICD-10-CM

## 2013-05-12 DIAGNOSIS — R1031 Right lower quadrant pain: Secondary | ICD-10-CM

## 2013-05-12 DIAGNOSIS — N8110 Cystocele, unspecified: Secondary | ICD-10-CM

## 2013-05-12 DIAGNOSIS — K625 Hemorrhage of anus and rectum: Secondary | ICD-10-CM

## 2013-05-12 DIAGNOSIS — G8929 Other chronic pain: Secondary | ICD-10-CM

## 2013-05-12 DIAGNOSIS — R1032 Left lower quadrant pain: Secondary | ICD-10-CM

## 2013-05-12 NOTE — Progress Notes (Signed)
i agree with the plans above 

## 2013-05-14 ENCOUNTER — Telehealth: Payer: Self-pay | Admitting: Physician Assistant

## 2013-05-14 NOTE — Telephone Encounter (Signed)
Spoke with patient's husband and he will call Patty to schedule the procedure. He understands it may be scheduled at [facility] endoscopy due to her age and other medical conditions.

## 2013-05-14 NOTE — Telephone Encounter (Signed)
Spoke with patient's husband and gave him results as per Mike Gip, PA and Dr. Christella Hartigan. He will call back tomorrow to schedule flex sig.

## 2013-05-15 NOTE — Telephone Encounter (Signed)
Error

## 2013-05-19 ENCOUNTER — Ambulatory Visit (INDEPENDENT_AMBULATORY_CARE_PROVIDER_SITE_OTHER): Payer: Medicare Other | Admitting: Family

## 2013-05-19 VITALS — BP 130/74 | HR 76 | Temp 98.0°F | Resp 16 | Ht 64.0 in | Wt 112.0 lb

## 2013-05-19 DIAGNOSIS — F341 Dysthymic disorder: Secondary | ICD-10-CM

## 2013-05-19 DIAGNOSIS — K625 Hemorrhage of anus and rectum: Secondary | ICD-10-CM

## 2013-05-19 NOTE — Progress Notes (Signed)
Pre visit review using our clinic review tool, if applicable. No additional management support is needed unless otherwise documented below in the visit note. 

## 2013-05-19 NOTE — Progress Notes (Signed)
Subjective:    Patient ID: Leah Harper, female    DOB: [birth year], 79 y.o.   MRN: [id number]  HPI  Leah Harper is a 79 yr old female who presents today with chief complaint of rectal bleeding. Since her last visit, she was evaluated by GI.  A CT of the abdomen and pelvis was recommended. CT noted bilateral pleural effusions, stable sigmoid diverticulosis.  No evidence of rectal mass.  The patient has been scheduled for a flex sig.   Husband and son noted that when she got up this AM she seemed very weak but never had LOC. They report that they think she has "given up."   Lab Results  Component Value Date   WBC 6.6 05/08/2013   HGB 12.9 05/08/2013   HCT 37.6 05/08/2013   MCV 89.3 05/08/2013   PLT 323 05/08/2013    Wt Readings from Last 3 Encounters:  05/19/13 112 lb (50.803 kg)  05/09/13 117 lb 12.8 oz (53.434 kg)  05/08/13 119 lb 8 oz (54.205 kg)      Review of Systems See HPI  Past Medical History  Diagnosis Date  . Hyperlipidemia   . Hypertension   . AF (atrial fibrillation)   . Hyperthyroidism     remotely in 1941  . Normocytic anemia     2002  . S/P TAH-BSO (total abdominal hysterectomy and bilateral salpingo-oophorectomy)   . Allergy   . Arrhythmia   . Anxiety   . Dementia   . CHF (congestive heart failure)   . Kidney infection     history of   . GERD (gastroesophageal reflux disease)   . Rectal bleeding 05/11/2013    History   Social History  . Marital Status: Married    Spouse Name: [name]    Number of Children: 3  . Years of Education: N/A   Occupational History  . retired    Social History Main Topics  . Smoking status: Former Smoker    Quit date: 07/17/1963  . Smokeless tobacco: Never Used  . Alcohol Use: No  . Drug Use: No  . Sexual Activity: Not on file   Other Topics Concern  . Not on file   Social History Narrative   Retired   Married -3 children    Tobacco abuse-no   Alcohol use-no     Regular exercise-no    Past  Surgical History  Procedure Laterality Date  . Tonsillectomy    . Total abdominal hysterectomy w/ bilateral salpingoophorectomy  1986  . Bilateral salpingoophorectomy    . Bladder suspension      bladder tack with TAHBSO    Family History  Problem Relation Age of Onset  . Heart disease Father   . Hyperlipidemia Father   . Heart attack Father   . Heart disease Brother     heart attack  . Heart disease Brother     Allergies  Allergen Reactions  . Exelon [Rivastigmine]     Patch burns her skin and is unable to tolerate it.  . Statins     Myalgia   . Tylenol [Acetaminophen]     Facial rash    Current Outpatient Prescriptions on File Prior to Visit  Medication Sig Dispense Refill  . ALPRAZolam (XANAX) 0.25 MG tablet Take 1 tablet (0.25 mg total) by mouth 2 (two) times daily as needed. For anxiety  60 tablet  0  . apixaban (ELIQUIS) 2.5 MG TABS tablet Take 1 tablet (2.5 mg total) by mouth 2 (two)  times daily.  60 tablet  11  . Bacitracin-Polymyxin B (POLYSPORIN EX) Apply 1 application topically daily as needed (outbreak on legs). cream      . diltiazem (CARDIZEM) 90 MG tablet Take 90 mg by mouth 2 (two) times daily.      Marland Kitchen docusate sodium (COLACE) 100 MG capsule Take 1 capsule (100 mg total) by mouth daily.  30 capsule  1  . ENSURE (ENSURE) Take 237 mLs by mouth 2 (two) times daily between meals.       Marland Kitchen ezetimibe (ZETIA) 10 MG tablet Take 1 tablet (10 mg total) by mouth daily.  90 tablet  1  . furosemide (LASIX) 20 MG tablet Take 2 tablets (40 mg total) by mouth daily. Weight gain > 5 lbs  60 tablet  3  . omeprazole (PRILOSEC) 20 MG capsule Take 20 mg by mouth daily as needed (acid reflux).        No current facility-administered medications on file prior to visit.    BP 130/74  Pulse 76  Temp(Src) 98 F (36.7 C) (Oral)  Resp 16  Ht 5\' 4"  (1.626 m)  Wt 112 lb (50.803 kg)  BMI 19.22 kg/m2  SpO2 99%       Objective:   Physical Exam  Constitutional: She appears  well-developed and well-nourished. No distress.  Cardiovascular: Normal rate and regular rhythm.   Pulmonary/Chest: Effort normal and breath sounds normal. No respiratory distress. She has no wheezes. She has no rales. She exhibits no tenderness.  Musculoskeletal:  1+ bilateral LE edema.   Neurological: She is alert.  Mildly confused.  Psychiatric: She has a normal mood and affect.          Assessment & Plan:

## 2013-05-19 NOTE — Patient Instructions (Signed)
Please follow up in 6 weeks. Call sooner if problems/concerns.

## 2013-05-20 ENCOUNTER — Telehealth: Payer: Self-pay

## 2013-05-20 NOTE — Telephone Encounter (Signed)
Please let Mr. Galeas know that I reviewed with Dr. Christella Hartigan and ok to stay on blood thinner for sigmoidoscopy.

## 2013-05-20 NOTE — Telephone Encounter (Signed)
Notified pts husband.

## 2013-05-20 NOTE — Assessment & Plan Note (Addendum)
We again discussed possibility of adding an antidepressant.  Pt has refused in the past.  Husband does not wish to reattempt at this time. He wants to focus on the patient's comfort which is reasonable at this point.  I think that depression along with her progressive dementia (she will not take aricept/namenda and could not tolerate exelon patch due to skin irritation) are the primary issue for her.  I did recommend that we check a urine to rule out UTI as she is prone. She was unable to provide sample, but the husband took specimen cup home to return.

## 2013-05-20 NOTE — Telephone Encounter (Signed)
Message copied by Sandford Craze on Tue May 20, 2013  8:59 AM ------      Message from: Rachael Fee      Created: Tue May 20, 2013  7:30 AM       Efraim Kaufmann,      It is OK to continue the eloquis.  I am not planning on doing any major therapies (polypectomy) at her age. This is more for diagnosis only and so fine to stay on eloquis.  Thanks            Joaquin Music.                  ----- Message -----         From: Sandford Craze, NP         Sent: 05/19/2013   3:47 PM           To: Rachael Fee, MD            Jesusita Oka,            Ms.  Ruacho is on eliquis.  Her husband wanted to confirm that it is OK to continue for her flex sig on 12/15.              Thanks,            Lakendra Helling       ------

## 2013-05-20 NOTE — Telephone Encounter (Signed)
Message copied by Donata Duff on Tue May 20, 2013  8:12 AM ------      Message from: Rachael Fee      Created: Tue May 20, 2013  7:30 AM       Efraim Kaufmann,      It is OK to continue the eloquis.  I am not planning on doing any major therapies (polypectomy) at her age. This is more for diagnosis only and so fine to stay on eloquis.  Thanks            Joaquin Music.                  ----- Message -----         From: Sandford Craze, NP         Sent: 05/19/2013   3:47 PM           To: Rachael Fee, MD            Jesusita Oka,            Ms.  Keinath is on eliquis.  Her husband wanted to confirm that it is OK to continue for her flex sig on 12/15.              Thanks,            Melissa       ------

## 2013-05-20 NOTE — Assessment & Plan Note (Addendum)
Clinically stable.  Blood count has been good. She has rectal discomfort.  Scheduled for sigmoidoscopy for diagnostic purposes.

## 2013-05-23 ENCOUNTER — Other Ambulatory Visit: Payer: Self-pay | Admitting: Family

## 2013-05-23 DIAGNOSIS — R4182 Altered mental status, unspecified: Secondary | ICD-10-CM

## 2013-05-23 NOTE — Progress Notes (Signed)
Pt returned urine sample for u/a and culture per 05/19/13 office note. Orders entered.

## 2013-05-24 LAB — URINALYSIS, COMPLETE
Bacteria, UA: NONE SEEN
Glucose, UA: NEGATIVE mg/dL
Ketones, ur: NEGATIVE mg/dL
Leukocytes, UA: NEGATIVE
Nitrite: NEGATIVE
Protein, ur: 30 mg/dL — AB

## 2013-05-24 LAB — URINE CULTURE: Colony Count: 2000

## 2013-05-26 ENCOUNTER — Telehealth: Payer: Self-pay | Admitting: *Deleted

## 2013-05-26 NOTE — Telephone Encounter (Signed)
Message copied by Kathi Simpers on Mon May 26, 2013  3:29 PM ------      Message from: [place], MELISSA      Created: Sun May 25, 2013 10:21 AM       Urine culture is negative. ------

## 2013-05-26 NOTE — Telephone Encounter (Signed)
No answer at home and cell #s; unable to leave message. Mailed letter to pt.

## 2013-05-28 ENCOUNTER — Encounter: Payer: Self-pay | Admitting: Family

## 2013-05-28 ENCOUNTER — Ambulatory Visit (INDEPENDENT_AMBULATORY_CARE_PROVIDER_SITE_OTHER): Payer: Medicare Other | Admitting: Family

## 2013-05-28 VITALS — BP 100/70 | HR 76 | Temp 97.4°F | Resp 18 | Ht 64.0 in | Wt 112.1 lb

## 2013-05-28 DIAGNOSIS — R630 Anorexia: Secondary | ICD-10-CM

## 2013-05-28 DIAGNOSIS — L853 Xerosis cutis: Secondary | ICD-10-CM

## 2013-05-28 DIAGNOSIS — L258 Unspecified contact dermatitis due to other agents: Secondary | ICD-10-CM

## 2013-05-28 DIAGNOSIS — L03116 Cellulitis of left lower limb: Secondary | ICD-10-CM

## 2013-05-28 DIAGNOSIS — L02419 Cutaneous abscess of limb, unspecified: Secondary | ICD-10-CM

## 2013-05-28 MED ORDER — DOXYCYCLINE HYCLATE 100 MG PO TABS: 100.0000 mg | ORAL_TABLET | Freq: Two times a day (BID) | ORAL | Status: DC

## 2013-05-28 NOTE — Progress Notes (Signed)
Subjective:    Patient ID: Leah Harper, female    DOB: 12-19-1919, 79 y.o.   MRN: [id number]  HPI  Leah Harper is a 79 yr old female who presents today with her husband due to non-healing wound on the left shin.  Husband reports that on 10/25 the patient scraped left shin. Family has been dressing wound daily and applying bacitracin. Reports that it had been improving until recently and he now feels that it looks worse.  Rash- around her waist and back.  Reports + itching. Husband has been applying hydrocortisone and changed soap without improvement. Husband denies associated fever. Also notes + soreness of tailbone.  Anorexia- husband seems to think that her appetite continues to worsen.  She remains weak. Recent urine culture is negative.  Wt Readings from Last 3 Encounters:  05/28/13 112 lb 1.3 oz (50.839 kg)  05/19/13 112 lb (50.803 kg)  05/09/13 117 lb 12.8 oz (53.434 kg)    Review of Systems See HPI  Past Medical History  Diagnosis Date  . Hyperlipidemia   . Hypertension   . AF (atrial fibrillation)   . Hyperthyroidism     remotely in 1941  . Normocytic anemia     2002  . S/P TAH-BSO (total abdominal hysterectomy and bilateral salpingo-oophorectomy)   . Allergy   . Arrhythmia   . Anxiety   . Dementia   . CHF (congestive heart failure)   . Kidney infection     history of   . GERD (gastroesophageal reflux disease)   . Rectal bleeding 05/11/2013    History   Social History  . Marital Status: Married    Spouse Name: Leah Harper    Number of Children: 3  . Years of Education: N/A   Occupational History  . retired    Social History Main Topics  . Smoking status: Former Smoker    Quit date: 07/17/1963  . Smokeless tobacco: Never Used  . Alcohol Use: No  . Drug Use: No  . Sexual Activity: Not on file   Other Topics Concern  . Not on file   Social History Narrative   Retired   Married -3 children    Tobacco abuse-no   Alcohol use-no     Regular  exercise-no    Past Surgical History  Procedure Laterality Date  . Tonsillectomy    . Total abdominal hysterectomy w/ bilateral salpingoophorectomy  1986  . Bilateral salpingoophorectomy    . Bladder suspension      bladder tack with TAHBSO    Family History  Problem Relation Age of Onset  . Heart disease Father   . Hyperlipidemia Father   . Heart attack Father   . Heart disease Brother     heart attack  . Heart disease Brother     Allergies  Allergen Reactions  . Exelon [Rivastigmine]     Patch burns her skin and is unable to tolerate it.  . Statins     Myalgia   . Tylenol [Acetaminophen]     Facial rash    Current Outpatient Prescriptions on File Prior to Visit  Medication Sig Dispense Refill  . ALPRAZolam (XANAX) 0.25 MG tablet Take 1 tablet (0.25 mg total) by mouth 2 (two) times daily as needed. For anxiety  60 tablet  0  . apixaban (ELIQUIS) 2.5 MG TABS tablet Take 1 tablet (2.5 mg total) by mouth 2 (two) times daily.  60 tablet  11  . Bacitracin-Polymyxin B (POLYSPORIN EX) Apply 1 application  topically daily as needed (outbreak on legs). cream      . diltiazem (CARDIZEM) 90 MG tablet Take 90 mg by mouth 2 (two) times daily.      Marland Kitchen docusate sodium (COLACE) 100 MG capsule Take 1 capsule (100 mg total) by mouth daily.  30 capsule  1  . ENSURE (ENSURE) Take 237 mLs by mouth 2 (two) times daily between meals.       Marland Kitchen ezetimibe (ZETIA) 10 MG tablet Take 1 tablet (10 mg total) by mouth daily.  90 tablet  1  . furosemide (LASIX) 20 MG tablet Take 2 tablets (40 mg total) by mouth daily. Weight gain > 5 lbs  60 tablet  3  . omeprazole (PRILOSEC) 20 MG capsule Take 20 mg by mouth daily as needed (acid reflux).        No current facility-administered medications on file prior to visit.    BP 100/70  Pulse 76  Temp(Src) 97.4 F (36.3 C) (Oral)  Resp 18  Ht 5\' 4"  (1.626 m)  Wt 112 lb 1.3 oz (50.839 kg)  BMI 19.23 kg/m2  SpO2 96%       Objective:   Physical Exam    Constitutional: She is oriented to person, place, and time. She appears well-developed and well-nourished. No distress.  HENT:  Head: Normocephalic and atraumatic.  Cardiovascular: Normal rate and regular rhythm.   No murmur heard. Pulmonary/Chest: Effort normal and breath sounds normal. No respiratory distress. She has no wheezes. She has no rales. She exhibits no tenderness.  Musculoskeletal: She exhibits no edema.  Neurological: She is alert and oriented to person, place, and time.  Skin:     Diffuse excoriations/scabbing on back upper abdomen.  Dry skin noted  Erythema and shallow ulceration of the left shin as outlined.  Scant bloody drainage, no odor.   Psychiatric: She has a normal mood and affect. Her behavior is normal. Judgment and thought content normal.          Assessment & Plan:

## 2013-05-28 NOTE — Assessment & Plan Note (Signed)
Wound was cleansed and dressed.  Rx with doxy. Husband given instruction re: to follow up as outlined in AVS.

## 2013-05-28 NOTE — Assessment & Plan Note (Signed)
Recommended aveeno lotion after bathing and hydrocortisone prn itching.

## 2013-05-28 NOTE — Assessment & Plan Note (Signed)
Resume megace- husband has refill at home.

## 2013-05-28 NOTE — Patient Instructions (Signed)
Start doxycycline for wound infection. Change dressing once daily. Follow up on Tuesday. Call sooner if fever, increased redness, swelling, pain. Moisturize skin after each bath using a good moisturizer such as Aveeno. You may apply hydrocortisone as needed to rash for itching.

## 2013-05-30 ENCOUNTER — Telehealth: Payer: Self-pay

## 2013-05-30 NOTE — Telephone Encounter (Signed)
She will need to hold Eliquis for 48 hours before procedure and need to fax a note to Dr Eden Emms for consent to come off Eliquis... Will send this note to regina also - she can get cardiology consent if that is something you don'tt normally do.. Thanks.

## 2013-05-30 NOTE — Telephone Encounter (Signed)
[name], you just saw this pt on 05-09-13 in office and she is scheduled for previsit on Monday.  What do you want to do about her Eliquis?  She is scheduled for flex sig on 06-09-13.

## 2013-05-30 NOTE — Telephone Encounter (Signed)
Please, see 05/20/13 telephone note. Dr. Christella Hartigan has addressed this. Mike Gip, PA wants to follow Dr. Christella Hartigan plan.

## 2013-05-30 NOTE — Telephone Encounter (Signed)
Saw earlier telephone note where Clayborne Dana addressed Eliquis with Dr Christella Hartigan.  Sorry [name].

## 2013-06-02 ENCOUNTER — Ambulatory Visit (AMBULATORY_SURGERY_CENTER): Payer: Self-pay | Admitting: *Deleted

## 2013-06-02 VITALS — Ht 64.0 in | Wt 117.0 lb

## 2013-06-02 DIAGNOSIS — K625 Hemorrhage of anus and rectum: Secondary | ICD-10-CM

## 2013-06-02 NOTE — Progress Notes (Signed)
Patient's husband and son at her side. Answered their many questions. Encouraged to make office visit with Dr.Jacobs if needed. They did not want to at this time, but states they would like to speak with Dr.Jacobs before procedure begins. Note placed on front of chart and under flex. Sig. Appointment notes.

## 2013-06-03 ENCOUNTER — Ambulatory Visit: Payer: Medicare Other | Admitting: Family

## 2013-06-04 ENCOUNTER — Encounter: Payer: Self-pay | Admitting: Gastroenterology

## 2013-06-09 ENCOUNTER — Ambulatory Visit (AMBULATORY_SURGERY_CENTER): Payer: Medicare Other | Admitting: Gastroenterology

## 2013-06-09 ENCOUNTER — Encounter: Payer: Self-pay | Admitting: Gastroenterology

## 2013-06-09 VITALS — BP 148/97 | HR 139 | Temp 99.1°F | Resp 25 | Ht 64.0 in | Wt 117.0 lb

## 2013-06-09 DIAGNOSIS — R933 Abnormal findings on diagnostic imaging of other parts of digestive tract: Secondary | ICD-10-CM

## 2013-06-09 DIAGNOSIS — K625 Hemorrhage of anus and rectum: Secondary | ICD-10-CM

## 2013-06-09 DIAGNOSIS — D126 Benign neoplasm of colon, unspecified: Secondary | ICD-10-CM

## 2013-06-09 MED ORDER — SODIUM CHLORIDE 0.9 % IV SOLN: 500.0000 mL | INTRAVENOUS | Status: DC

## 2013-06-09 MED ORDER — FLEET ENEMA 7-19 GM/118ML RE ENEM
1.0000 | ENEMA | Freq: Once | RECTAL | Status: AC
  Administered 2013-06-09: 1 via RECTAL

## 2013-06-09 NOTE — Progress Notes (Signed)
Pt into recovery room with heart rate 40's to 150's.  Dr Larae Grooms at bedside with family. Husband states its not unusual for her to have elevated heart rate. Especially when she gets nervous or anxious. ewm

## 2013-06-09 NOTE — Op Note (Signed)
[facility] [address] [place], [address]   FLEXIBLE SIGMOIDOSCOPY PROCEDURE REPORT  PATIENT: Leah, Harper.  MR#: [id number] BIRTHDATE: 10/16/1919 , 89  yrs. old GENDER: Female ENDOSCOPIST: Rachael Fee, MD REFERRED BY: Sandford Craze, FNP PROCEDURE DATE:  06/09/2013 PROCEDURE:    Flexible sigmoidoscopy ASA CLASS:   Class III INDICATIONS:rectal bleeding, abnormal anoscopy, anal discomfort. MEDICATIONS: Fentanyl 25 mcg IV, Versed 1mg  IV, and These medications were titrated to patient response per physician's verbal order  DESCRIPTION OF PROCEDURE:   After the risks benefits and alternatives of the procedure were thoroughly explained, informed consent was obtained.  revealed no abnormalities of the rectum. The LB [id number]  endoscope was introduced through the anus  and advanced to the cecum , limited by No adverse events experienced. The quality of the prep was adequate .  The instrument was then slowly withdrawn as the mucosa was fully examined.   COLON FINDINGS: The prep was good for a flex sig prep and the colonoscope passed very easily, further than inititially I expected or intended. At that point, I clearly saw the IC valve and elected to advance the last few centimeters into the base of cecum to really clear her colon completely given the bleeding, frailty. This was most complete way to ensure no signficant (cancerous) colonic pathology. There distal colonic mucosa was somewhat erythematous, inflamed appearing.  Biopsies were taken and sent to pathology. There were internal and small external hemorrhoids. Retroflexed views revealed no abnormalities. The scope was then withdrawn from the patient and the procedure terminated.  COMPLICATIONS: There were no complications.  ENDOSCOPIC IMPRESSION: There distal colonic mucosa was somewhat erythematous, inflamed appearing. Biopsies were taken and sent to pathology. There were internal  and small external hemorrhoids.  RECOMMENDATIONS: Await biopsy results.   _______________________________ eSignedRachael Fee, MD 06/09/2013 1:47 PM

## 2013-06-09 NOTE — Progress Notes (Signed)
Patient with dementia, patient's son and husband present. Fleets enema not given prior to admission. Patient did take Magnesium citrate with results at 0130 this am, brown liquid and stool. Fleet enema's x1 given , no results. Patient taken off bedpan to prevent skin breakdown. Son and husband want to speak to Dr. Christella Hartigan prior to taking  patient to the procedure room.

## 2013-06-09 NOTE — Progress Notes (Signed)
Patient did not experience any of the following events: a burn prior to discharge; a fall within the facility; wrong site/side/patient/procedure/implant event; or a hospital transfer or hospital admission upon discharge from the facility. (G8907)Patient did not have preoperative order for IV antibiotic SSI prophylaxis. (G8918) ewm 

## 2013-06-09 NOTE — Patient Instructions (Signed)

## 2013-06-10 ENCOUNTER — Telehealth: Payer: Self-pay | Admitting: *Deleted

## 2013-06-10 NOTE — Telephone Encounter (Signed)
*   no answer on home phone, cell disconnected.  Unable to reach pt. For follow-up call

## 2013-06-13 ENCOUNTER — Encounter: Payer: Self-pay | Admitting: Gastroenterology

## 2013-06-17 ENCOUNTER — Ambulatory Visit (HOSPITAL_BASED_OUTPATIENT_CLINIC_OR_DEPARTMENT_OTHER)
Admission: RE | Admit: 2013-06-17 | Discharge: 2013-06-17 | Disposition: A | Discharge status: Home / Self Care | Payer: Medicare Other | Source: Ambulatory Visit | Attending: Family | Admitting: Family

## 2013-06-17 ENCOUNTER — Ambulatory Visit (INDEPENDENT_AMBULATORY_CARE_PROVIDER_SITE_OTHER): Payer: Medicare Other | Admitting: Family

## 2013-06-17 VITALS — BP 100/76 | HR 76 | Temp 97.5°F | Resp 18 | Ht 64.0 in | Wt 109.0 lb

## 2013-06-17 DIAGNOSIS — M25559 Pain in unspecified hip: Secondary | ICD-10-CM

## 2013-06-17 DIAGNOSIS — I509 Heart failure, unspecified: Secondary | ICD-10-CM

## 2013-06-17 DIAGNOSIS — M899 Disorder of bone, unspecified: Secondary | ICD-10-CM | POA: Insufficient documentation

## 2013-06-17 DIAGNOSIS — M25551 Pain in right hip: Secondary | ICD-10-CM

## 2013-06-17 DIAGNOSIS — R630 Anorexia: Secondary | ICD-10-CM

## 2013-06-17 DIAGNOSIS — F0390 Unspecified dementia without behavioral disturbance: Secondary | ICD-10-CM

## 2013-06-17 DIAGNOSIS — N289 Disorder of kidney and ureter, unspecified: Secondary | ICD-10-CM

## 2013-06-17 LAB — BASIC METABOLIC PANEL
CO2: 26 mEq/L (ref 19–32)
Calcium: 8.8 mg/dL (ref 8.4–10.5)
Chloride: 98 mEq/L (ref 96–112)
Creat: 2.24 mg/dL — ABNORMAL HIGH (ref 0.50–1.10)
Glucose, Bld: 126 mg/dL — ABNORMAL HIGH (ref 70–99)

## 2013-06-17 MED ORDER — EZETIMIBE 10 MG PO TABS: 10.0000 mg | ORAL_TABLET | Freq: Every day | ORAL | Status: AC

## 2013-06-17 MED ORDER — TRAMADOL HCL 50 MG PO TABS: 50.0000 mg | ORAL_TABLET | Freq: Three times a day (TID) | ORAL | Status: DC | PRN

## 2013-06-17 MED ORDER — FUROSEMIDE 20 MG PO TABS: 40.0000 mg | ORAL_TABLET | Freq: Every day | ORAL | Status: DC

## 2013-06-17 NOTE — Assessment & Plan Note (Signed)
Appears euvolemic today.

## 2013-06-17 NOTE — Progress Notes (Signed)
Pre visit review using our clinic review tool, if applicable. No additional management support is needed unless otherwise documented below in the visit note. 

## 2013-06-17 NOTE — Assessment & Plan Note (Addendum)
Obtain X ray.   Add tramadol prn pain.  Advised husband to start with 1/2 tab as needed.

## 2013-06-17 NOTE — Assessment & Plan Note (Addendum)
We discussed that weakness and anorexia may be secondary to advancing dementia.  I have spoken to the patient's husband and family we did discuss that it might be reasonable to consult home hospice/palliative care.  They are realistic about pt's condition, but husband wishes to hold off on referral at this time and focus on pain control of right hip.  I have asked husband to bring urinalysis back tomorrow for culture.  The pt will also complete a bmet today. Continue megace.

## 2013-06-17 NOTE — Patient Instructions (Signed)
Please complete x ray on the first floor. Return urinalysis at your earliest convenience. Start tramadol 50mg  1/2-1 tab by mouth every 8 hours as needed for hip pain. Follow up in 1 month, sooner if problem/concerns.

## 2013-06-17 NOTE — Progress Notes (Signed)
Subjective:    Patient ID: Leah Harper, female    DOB: December 22, 1919, 79 y.o.   MRN: [id number]  HPI  Leah Harper is a 79 yr old female with advancing dementia, who presents today with chief complaint of fatigue. She is brought by her husband and 2 sons. Husband notes patient continues to have progressive weakness and poor PO intake. She often will not feed herself and the family must assist.    The patient is most concerned re: R hip pain.  Per family she has 3-4 week hx of right hip pain.    Review of Systems See HPI  Past Medical History  Diagnosis Date  . Hyperlipidemia   . Hypertension   . AF (atrial fibrillation)   . Hyperthyroidism     remotely in 1941  . Normocytic anemia     2002  . S/P TAH-BSO (total abdominal hysterectomy and bilateral salpingo-oophorectomy)   . Allergy   . Arrhythmia   . Anxiety   . Dementia   . CHF (congestive heart failure)   . Kidney infection     history of   . GERD (gastroesophageal reflux disease)   . Rectal bleeding 05/11/2013    History   Social History  . Marital Status: Married    Spouse Name: [name]    Number of Children: 3  . Years of Education: N/A   Occupational History  . retired    Social History Main Topics  . Smoking status: Former Smoker    Quit date: 07/17/1963  . Smokeless tobacco: Never Used  . Alcohol Use: No  . Drug Use: No  . Sexual Activity: Not on file   Other Topics Concern  . Not on file   Social History Narrative   Retired   Married -3 children    Tobacco abuse-no   Alcohol use-no     Regular exercise-no    Past Surgical History  Procedure Laterality Date  . Tonsillectomy    . Total abdominal hysterectomy w/ bilateral salpingoophorectomy  1986  . Bilateral salpingoophorectomy    . Bladder suspension      bladder tack with TAHBSO    Family History  Problem Relation Age of Onset  . Heart disease Father   . Hyperlipidemia Father   . Heart attack Father   . Heart disease Brother       heart attack  . Heart disease Brother     Allergies  Allergen Reactions  . Exelon [Rivastigmine]     Patch burns her skin and is unable to tolerate it.  . Statins     Myalgia   . Tylenol [Acetaminophen]     Facial rash    Current Outpatient Prescriptions on File Prior to Visit  Medication Sig Dispense Refill  . ALPRAZolam (XANAX) 0.25 MG tablet Take 1 tablet (0.25 mg total) by mouth 2 (two) times daily as needed. For anxiety  60 tablet  0  . apixaban (ELIQUIS) 2.5 MG TABS tablet Take 1 tablet (2.5 mg total) by mouth 2 (two) times daily.  60 tablet  11  . diltiazem (CARDIZEM) 90 MG tablet Take 90 mg by mouth 2 (two) times daily.      Marland Kitchen docusate sodium (COLACE) 100 MG capsule Take 1 capsule (100 mg total) by mouth daily.  30 capsule  1  . ENSURE (ENSURE) Take 237 mLs by mouth 2 (two) times daily between meals.       Marland Kitchen ezetimibe (ZETIA) 10 MG tablet Take 1 tablet (  10 mg total) by mouth daily.  90 tablet  1  . furosemide (LASIX) 20 MG tablet Take 2 tablets (40 mg total) by mouth daily. Weight gain > 5 lbs  60 tablet  3  . megestrol (MEGACE) 400 MG/10ML suspension Take 400 mg by mouth daily.      Marland Kitchen omeprazole (PRILOSEC) 20 MG capsule Take 20 mg by mouth daily as needed (acid reflux).        No current facility-administered medications on file prior to visit.    BP 100/76  Pulse 76  Temp(Src) 97.5 F (36.4 C) (Oral)  Resp 18  Ht 5\' 4"  (1.626 m)  Wt 109 lb (49.442 kg)  BMI 18.70 kg/m2  SpO2 95%       Objective:   Physical Exam  Constitutional:  Frail, elderly, weak appearing female in NAD  HENT:  Head: Normocephalic and atraumatic.  Cardiovascular: Normal rate and regular rhythm.   No murmur heard. Pulmonary/Chest: Effort normal and breath sounds normal. No respiratory distress. She has no wheezes. She has no rales. She exhibits no tenderness.  Neurological: She is alert.  Psychiatric:  Mood seems better that on recent visit.           Assessment & Plan:

## 2013-06-18 ENCOUNTER — Encounter: Payer: Self-pay | Admitting: Family

## 2013-06-18 ENCOUNTER — Telehealth: Payer: Self-pay | Admitting: Family

## 2013-06-18 NOTE — Telephone Encounter (Signed)
X ray of right hip is negative for fracture, kidney function is stable. No need to send lab letter.

## 2013-06-18 NOTE — Telephone Encounter (Signed)
Notified pts husband.

## 2013-06-23 ENCOUNTER — Other Ambulatory Visit: Payer: Self-pay | Admitting: Family

## 2013-06-23 DIAGNOSIS — F0390 Unspecified dementia without behavioral disturbance: Secondary | ICD-10-CM

## 2013-06-23 NOTE — Progress Notes (Signed)
Pt presented to the lab for urine culture per 06/17/13 office note. Order entered.

## 2013-06-24 LAB — URINE CULTURE

## 2013-06-26 ENCOUNTER — Encounter (HOSPITAL_COMMUNITY): Payer: Self-pay | Admitting: Emergency Medicine

## 2013-06-26 ENCOUNTER — Emergency Department (HOSPITAL_COMMUNITY): Payer: Medicare Other

## 2013-06-26 ENCOUNTER — Inpatient Hospital Stay (HOSPITAL_COMMUNITY)
Admission: EM | Admit: 2013-06-26 | Discharge: 2013-06-29 | DRG: 291 | Disposition: A | Discharge status: Home Health | Payer: Medicare Other | Attending: Internal Medicine | Admitting: Internal Medicine

## 2013-06-26 DIAGNOSIS — R5383 Other fatigue: Secondary | ICD-10-CM

## 2013-06-26 DIAGNOSIS — J9600 Acute respiratory failure, unspecified whether with hypoxia or hypercapnia: Secondary | ICD-10-CM | POA: Diagnosis present

## 2013-06-26 DIAGNOSIS — N183 Chronic kidney disease, stage 3 unspecified: Secondary | ICD-10-CM

## 2013-06-26 DIAGNOSIS — R609 Edema, unspecified: Secondary | ICD-10-CM

## 2013-06-26 DIAGNOSIS — F411 Generalized anxiety disorder: Secondary | ICD-10-CM | POA: Diagnosis present

## 2013-06-26 DIAGNOSIS — R531 Weakness: Secondary | ICD-10-CM

## 2013-06-26 DIAGNOSIS — L989 Disorder of the skin and subcutaneous tissue, unspecified: Secondary | ICD-10-CM

## 2013-06-26 DIAGNOSIS — N179 Acute kidney failure, unspecified: Secondary | ICD-10-CM

## 2013-06-26 DIAGNOSIS — K219 Gastro-esophageal reflux disease without esophagitis: Secondary | ICD-10-CM | POA: Diagnosis present

## 2013-06-26 DIAGNOSIS — Z7901 Long term (current) use of anticoagulants: Secondary | ICD-10-CM

## 2013-06-26 DIAGNOSIS — F341 Dysthymic disorder: Secondary | ICD-10-CM

## 2013-06-26 DIAGNOSIS — I714 Abdominal aortic aneurysm, without rupture, unspecified: Secondary | ICD-10-CM

## 2013-06-26 DIAGNOSIS — K5792 Diverticulitis of intestine, part unspecified, without perforation or abscess without bleeding: Secondary | ICD-10-CM

## 2013-06-26 DIAGNOSIS — R5381 Other malaise: Secondary | ICD-10-CM

## 2013-06-26 DIAGNOSIS — R9431 Abnormal electrocardiogram [ECG] [EKG]: Secondary | ICD-10-CM

## 2013-06-26 DIAGNOSIS — R413 Other amnesia: Secondary | ICD-10-CM

## 2013-06-26 DIAGNOSIS — M25552 Pain in left hip: Secondary | ICD-10-CM

## 2013-06-26 DIAGNOSIS — R634 Abnormal weight loss: Secondary | ICD-10-CM

## 2013-06-26 DIAGNOSIS — H9190 Unspecified hearing loss, unspecified ear: Secondary | ICD-10-CM | POA: Diagnosis present

## 2013-06-26 DIAGNOSIS — F320 Major depressive disorder, single episode, mild: Secondary | ICD-10-CM

## 2013-06-26 DIAGNOSIS — Z91199 Patient's noncompliance with other medical treatment and regimen due to unspecified reason: Secondary | ICD-10-CM

## 2013-06-26 DIAGNOSIS — E785 Hyperlipidemia, unspecified: Secondary | ICD-10-CM

## 2013-06-26 DIAGNOSIS — L853 Xerosis cutis: Secondary | ICD-10-CM

## 2013-06-26 DIAGNOSIS — F32A Depression, unspecified: Secondary | ICD-10-CM

## 2013-06-26 DIAGNOSIS — I509 Heart failure, unspecified: Secondary | ICD-10-CM | POA: Diagnosis present

## 2013-06-26 DIAGNOSIS — E039 Hypothyroidism, unspecified: Secondary | ICD-10-CM | POA: Diagnosis present

## 2013-06-26 DIAGNOSIS — I10 Essential (primary) hypertension: Secondary | ICD-10-CM

## 2013-06-26 DIAGNOSIS — L03116 Cellulitis of left lower limb: Secondary | ICD-10-CM

## 2013-06-26 DIAGNOSIS — Z9119 Patient's noncompliance with other medical treatment and regimen: Secondary | ICD-10-CM

## 2013-06-26 DIAGNOSIS — I5033 Acute on chronic diastolic (congestive) heart failure: Principal | ICD-10-CM

## 2013-06-26 DIAGNOSIS — D649 Anemia, unspecified: Secondary | ICD-10-CM

## 2013-06-26 DIAGNOSIS — M25551 Pain in right hip: Secondary | ICD-10-CM

## 2013-06-26 DIAGNOSIS — K625 Hemorrhage of anus and rectum: Secondary | ICD-10-CM

## 2013-06-26 DIAGNOSIS — F329 Major depressive disorder, single episode, unspecified: Secondary | ICD-10-CM

## 2013-06-26 DIAGNOSIS — N184 Chronic kidney disease, stage 4 (severe): Secondary | ICD-10-CM

## 2013-06-26 DIAGNOSIS — Z79899 Other long term (current) drug therapy: Secondary | ICD-10-CM

## 2013-06-26 DIAGNOSIS — E782 Mixed hyperlipidemia: Secondary | ICD-10-CM | POA: Diagnosis present

## 2013-06-26 DIAGNOSIS — I129 Hypertensive chronic kidney disease with stage 1 through stage 4 chronic kidney disease, or unspecified chronic kidney disease: Secondary | ICD-10-CM | POA: Diagnosis present

## 2013-06-26 DIAGNOSIS — F0390 Unspecified dementia without behavioral disturbance: Secondary | ICD-10-CM

## 2013-06-26 DIAGNOSIS — Z9189 Other specified personal risk factors, not elsewhere classified: Secondary | ICD-10-CM

## 2013-06-26 DIAGNOSIS — N289 Disorder of kidney and ureter, unspecified: Secondary | ICD-10-CM

## 2013-06-26 DIAGNOSIS — R630 Anorexia: Secondary | ICD-10-CM

## 2013-06-26 DIAGNOSIS — F3289 Other specified depressive episodes: Secondary | ICD-10-CM

## 2013-06-26 DIAGNOSIS — I4891 Unspecified atrial fibrillation: Secondary | ICD-10-CM

## 2013-06-26 DIAGNOSIS — N390 Urinary tract infection, site not specified: Secondary | ICD-10-CM

## 2013-06-26 DIAGNOSIS — H906 Mixed conductive and sensorineural hearing loss, bilateral: Secondary | ICD-10-CM

## 2013-06-26 DIAGNOSIS — Z87891 Personal history of nicotine dependence: Secondary | ICD-10-CM

## 2013-06-26 DIAGNOSIS — R0989 Other specified symptoms and signs involving the circulatory and respiratory systems: Secondary | ICD-10-CM

## 2013-06-26 LAB — CBC WITH DIFFERENTIAL/PLATELET
BASOS ABS: 0 10*3/uL (ref 0.0–0.1)
BASOS PCT: 0 % (ref 0–1)
EOS ABS: 0 10*3/uL (ref 0.0–0.7)
EOS PCT: 0 % (ref 0–5)
HEMATOCRIT: 40.2 % (ref 36.0–46.0)
Hemoglobin: 13 g/dL (ref 12.0–15.0)
Lymphocytes Relative: 36 % (ref 12–46)
Lymphs Abs: 3.4 10*3/uL (ref 0.7–4.0)
MCH: 29.1 pg (ref 26.0–34.0)
MCHC: 32.3 g/dL (ref 30.0–36.0)
MCV: 90.1 fL (ref 78.0–100.0)
MONO ABS: 0.6 10*3/uL (ref 0.1–1.0)
Monocytes Relative: 6 % (ref 3–12)
Neutro Abs: 5.4 10*3/uL (ref 1.7–7.7)
Neutrophils Relative %: 57 % (ref 43–77)
Platelets: 264 10*3/uL (ref 150–400)
RBC: 4.46 MIL/uL (ref 3.87–5.11)
RDW: 15.8 % — ABNORMAL HIGH (ref 11.5–15.5)
WBC: 9.5 10*3/uL (ref 4.0–10.5)

## 2013-06-26 LAB — POCT I-STAT, CHEM 8
BUN: 30 mg/dL — AB (ref 6–23)
Calcium, Ion: 1.15 mmol/L (ref 1.13–1.30)
Chloride: 102 mEq/L (ref 96–112)
Creatinine, Ser: 2.1 mg/dL — ABNORMAL HIGH (ref 0.50–1.10)
Glucose, Bld: 175 mg/dL — ABNORMAL HIGH (ref 70–99)
HCT: 42 % (ref 36.0–46.0)
Hemoglobin: 14.3 g/dL (ref 12.0–15.0)
Potassium: 3.6 mEq/L — ABNORMAL LOW (ref 3.7–5.3)
SODIUM: 140 meq/L (ref 137–147)
TCO2: 21 mmol/L (ref 0–100)

## 2013-06-26 LAB — BASIC METABOLIC PANEL
BUN: 31 mg/dL — ABNORMAL HIGH (ref 6–23)
CALCIUM: 9.1 mg/dL (ref 8.4–10.5)
CO2: 23 mEq/L (ref 19–32)
CREATININE: 2.12 mg/dL — AB (ref 0.50–1.10)
Chloride: 100 mEq/L (ref 96–112)
GFR calc non Af Amer: 19 mL/min — ABNORMAL LOW (ref >90)
GFR, EST AFRICAN AMERICAN: 22 mL/min — AB (ref >90)
Glucose, Bld: 136 mg/dL — ABNORMAL HIGH (ref 70–99)
Potassium: 4.3 mEq/L (ref 3.7–5.3)
Sodium: 141 mEq/L (ref 137–147)

## 2013-06-26 LAB — CREATININE, SERUM
Creatinine, Ser: 1.97 mg/dL — ABNORMAL HIGH (ref 0.50–1.10)
GFR, EST AFRICAN AMERICAN: 24 mL/min — AB (ref >90)
GFR, EST NON AFRICAN AMERICAN: 21 mL/min — AB (ref >90)

## 2013-06-26 LAB — URINE MICROSCOPIC-ADD ON

## 2013-06-26 LAB — CBC
HCT: 37.1 % (ref 36.0–46.0)
Hemoglobin: 12.6 g/dL (ref 12.0–15.0)
MCH: 30.6 pg (ref 26.0–34.0)
MCHC: 34 g/dL (ref 30.0–36.0)
MCV: 90 fL (ref 78.0–100.0)
Platelets: 235 10*3/uL (ref 150–400)
RBC: 4.12 MIL/uL (ref 3.87–5.11)
RDW: 15.7 % — AB (ref 11.5–15.5)
WBC: 6.6 10*3/uL (ref 4.0–10.5)

## 2013-06-26 LAB — PROTIME-INR
INR: 1.47 (ref 0.00–1.49)
Prothrombin Time: 17.4 seconds — ABNORMAL HIGH (ref 11.6–15.2)

## 2013-06-26 LAB — URINALYSIS, ROUTINE W REFLEX MICROSCOPIC
BILIRUBIN URINE: NEGATIVE
Glucose, UA: NEGATIVE mg/dL
HGB URINE DIPSTICK: NEGATIVE
Ketones, ur: NEGATIVE mg/dL
Leukocytes, UA: NEGATIVE
NITRITE: NEGATIVE
PH: 6.5 (ref 5.0–8.0)
Protein, ur: 100 mg/dL — AB
SPECIFIC GRAVITY, URINE: 1.019 (ref 1.005–1.030)
Urobilinogen, UA: 0.2 mg/dL (ref 0.0–1.0)

## 2013-06-26 LAB — APTT: aPTT: 35 seconds (ref 24–37)

## 2013-06-26 LAB — PRO B NATRIURETIC PEPTIDE: Pro B Natriuretic peptide (BNP): 34776 pg/mL — ABNORMAL HIGH (ref 0–450)

## 2013-06-26 LAB — TROPONIN I: Troponin I: <0.3 ng/mL (ref <0.30)

## 2013-06-26 MED ORDER — PANTOPRAZOLE SODIUM 40 MG PO TBEC
40.0000 mg | DELAYED_RELEASE_TABLET | Freq: Every day | ORAL | Status: DC
  Administered 2013-06-27 – 2013-06-28 (×2): 40 mg via ORAL
  Filled 2013-06-26 (×2): qty 1

## 2013-06-26 MED ORDER — MEGESTROL ACETATE 400 MG/10ML PO SUSP
400.0000 mg | Freq: Every day | ORAL | Status: DC
  Administered 2013-06-27 – 2013-06-29 (×3): 400 mg via ORAL
  Filled 2013-06-26 (×3): qty 10

## 2013-06-26 MED ORDER — EZETIMIBE 10 MG PO TABS
10.0000 mg | ORAL_TABLET | Freq: Every day | ORAL | Status: DC
  Administered 2013-06-27 – 2013-06-29 (×3): 10 mg via ORAL
  Filled 2013-06-26 (×3): qty 1

## 2013-06-26 MED ORDER — ONDANSETRON HCL 4 MG/2ML IJ SOLN: 4.0000 mg | Freq: Four times a day (QID) | INTRAMUSCULAR | Status: DC | PRN

## 2013-06-26 MED ORDER — ONDANSETRON HCL 4 MG PO TABS: 4.0000 mg | ORAL_TABLET | Freq: Four times a day (QID) | ORAL | Status: DC | PRN

## 2013-06-26 MED ORDER — FUROSEMIDE 10 MG/ML IJ SOLN
80.0000 mg | Freq: Once | INTRAMUSCULAR | Status: AC
  Administered 2013-06-26: 80 mg via INTRAVENOUS
  Filled 2013-06-26: qty 8

## 2013-06-26 MED ORDER — SODIUM CHLORIDE 0.9 % IJ SOLN
3.0000 mL | Freq: Two times a day (BID) | INTRAMUSCULAR | Status: DC
  Administered 2013-06-26 – 2013-06-28 (×6): 3 mL via INTRAVENOUS

## 2013-06-26 MED ORDER — MORPHINE SULFATE 2 MG/ML IJ SOLN: 2.0000 mg | INTRAMUSCULAR | Status: DC | PRN

## 2013-06-26 MED ORDER — DILTIAZEM HCL 100 MG IV SOLR
5.0000 mg/h | INTRAVENOUS | Status: DC
  Administered 2013-06-26: 10 mg/h via INTRAVENOUS
  Administered 2013-06-26: 15 mg/h via INTRAVENOUS

## 2013-06-26 MED ORDER — DILTIAZEM LOAD VIA INFUSION
10.0000 mg | Freq: Once | INTRAVENOUS | Status: AC
  Administered 2013-06-26: 10 mg via INTRAVENOUS
  Filled 2013-06-26: qty 10

## 2013-06-26 MED ORDER — DOCUSATE SODIUM 100 MG PO CAPS
100.0000 mg | ORAL_CAPSULE | Freq: Every day | ORAL | Status: DC
  Administered 2013-06-27 – 2013-06-29 (×3): 100 mg via ORAL
  Filled 2013-06-26 (×4): qty 1

## 2013-06-26 MED ORDER — TRAMADOL HCL 50 MG PO TABS: 50.0000 mg | ORAL_TABLET | Freq: Two times a day (BID) | ORAL | Status: DC | PRN

## 2013-06-26 MED ORDER — ALPRAZOLAM 0.25 MG PO TABS: 0.2500 mg | ORAL_TABLET | Freq: Two times a day (BID) | ORAL | Status: DC | PRN

## 2013-06-26 MED ORDER — APIXABAN 2.5 MG PO TABS
2.5000 mg | ORAL_TABLET | Freq: Two times a day (BID) | ORAL | Status: DC
  Administered 2013-06-26 – 2013-06-29 (×6): 2.5 mg via ORAL
  Filled 2013-06-26 (×7): qty 1

## 2013-06-26 NOTE — ED Notes (Signed)
Pt switched from NRB to venturi mask at 55 % FiO2

## 2013-06-26 NOTE — ED Notes (Signed)
Patient continuously trying to remove venturi mask; began trial on [place] at 1215 and has maintained sats of 98-99% on [place] 3L.

## 2013-06-26 NOTE — ED Notes (Signed)
Cardizem gtt continues to transfuse on transfer to [facility]

## 2013-06-26 NOTE — ED Notes (Signed)
Cardizem gtt decreased to 10mg /hr per Jerene Pitch, Cardiology, PA.

## 2013-06-26 NOTE — ED Provider Notes (Signed)
CSN: [id number]     Arrival date & time 06/26/13  [id number] History   First MD Initiated Contact with Patient 06/26/13 [phone]     Chief Complaint  Patient presents with  . Shortness of Breath   (Consider location/radiation/quality/duration/timing/severity/associated sxs/prior Treatment) Patient is a 80 y.o. female presenting with shortness of breath.  Shortness of Breath  LEvel 5 caveat due to urgent need for intervention PT with history of afib, CHF and dementia brought by EMS from home with sudden onset of SOB earlier this morning. No recent cough or fever. Given nebs by EMS with change in rhythm to afib/RVR while enroute. Pt denies CP.    Past Medical History  Diagnosis Date  . Hyperlipidemia   . Hypertension   . AF (atrial fibrillation)   . Hyperthyroidism     remotely in 1941  . Normocytic anemia     2002  . S/P TAH-BSO (total abdominal hysterectomy and bilateral salpingo-oophorectomy)   . Allergy   . Arrhythmia   . Anxiety   . Dementia   . CHF (congestive heart failure)   . Kidney infection     history of   . GERD (gastroesophageal reflux disease)   . Rectal bleeding 05/11/2013   Past Surgical History  Procedure Laterality Date  . Tonsillectomy    . Total abdominal hysterectomy w/ bilateral salpingoophorectomy  1986  . Bilateral salpingoophorectomy    . Bladder suspension      bladder tack with TAHBSO   Family History  Problem Relation Age of Onset  . Heart disease Father   . Hyperlipidemia Father   . Heart attack Father   . Heart disease Brother     heart attack  . Heart disease Brother    History  Substance Use Topics  . Smoking status: Former Smoker    Quit date: 07/17/1963  . Smokeless tobacco: Never Used  . Alcohol Use: No   OB History   Grav Para Term Preterm Abortions TAB SAB Ect Mult Living                 Review of Systems  Respiratory: Positive for shortness of breath.    Unable to assess due to mental status.   Allergies  Exelon; Statins;  and Tylenol  Home Medications  No current outpatient prescriptions on file. BP 137/58  Pulse 94  Temp(Src) 98.5 F (36.9 C) (Oral)  Resp 17  Ht 5\' 4"  (1.626 m)  Wt 111 lb 1.8 oz (50.4 kg)  BMI 19.06 kg/m2  SpO2 97% Physical Exam  Nursing note and vitals reviewed. Constitutional: She appears well-developed and well-nourished.  HENT:  Head: Normocephalic and atraumatic.  Eyes: EOM are normal. Pupils are equal, round, and reactive to light.  Neck: Normal range of motion. Neck supple.  Cardiovascular: Normal heart sounds and intact distal pulses.  Tachycardia present.   Pulmonary/Chest: She is in respiratory distress. She has no wheezes. She has rales.  Abdominal: Bowel sounds are normal. She exhibits no distension. There is no tenderness.  Musculoskeletal: Normal range of motion. She exhibits no edema and no tenderness.  Neurological: She is alert. She has normal strength. No cranial nerve deficit or sensory deficit.  Skin: Skin is warm and dry. No rash noted.  Psychiatric: She has a normal mood and affect.    ED Course  Procedures (including critical care time)  CRITICAL CARE Performed by: Truddie Hidden. Total critical care time: 60 Critical care time was exclusive of separately billable procedures and treating other  patients. Critical care was necessary to treat or prevent imminent or life-threatening deterioration. Critical care was time spent personally by me on the following activities: development of treatment plan with patient and/or surrogate as well as nursing, discussions with consultants, evaluation of patient's response to treatment, examination of patient, obtaining history from patient or surrogate, ordering and performing treatments and interventions, ordering and review of laboratory studies, ordering and review of radiographic studies, pulse oximetry and re-evaluation of patient's condition.   Labs Review Labs Reviewed  CBC WITH DIFFERENTIAL - Abnormal;  Notable for the following:    RDW 15.8 (*)    All other components within normal limits  BASIC METABOLIC PANEL - Abnormal; Notable for the following:    Glucose, Bld 136 (*)    BUN 31 (*)    Creatinine, Ser 2.12 (*)    GFR calc non Af Amer 19 (*)    GFR calc Af Amer 22 (*)    All other components within normal limits  PRO B NATRIURETIC PEPTIDE - Abnormal; Notable for the following:    Pro B Natriuretic peptide (BNP) 34776.0 (*)    All other components within normal limits  PROTIME-INR - Abnormal; Notable for the following:    Prothrombin Time 17.4 (*)    All other components within normal limits  URINALYSIS, ROUTINE W REFLEX MICROSCOPIC - Abnormal; Notable for the following:    APPearance CLOUDY (*)    Protein, ur 100 (*)    All other components within normal limits  URINE MICROSCOPIC-ADD ON - Abnormal; Notable for the following:    Casts HYALINE CASTS (*)    All other components within normal limits  CBC - Abnormal; Notable for the following:    RDW 15.7 (*)    All other components within normal limits  CREATININE, SERUM - Abnormal; Notable for the following:    Creatinine, Ser 1.97 (*)    GFR calc non Af Amer 21 (*)    GFR calc Af Amer 24 (*)    All other components within normal limits  POCT I-STAT, CHEM 8 - Abnormal; Notable for the following:    Potassium 3.6 (*)    BUN 30 (*)    Creatinine, Ser 2.10 (*)    Glucose, Bld 175 (*)    All other components within normal limits  TROPONIN I  APTT  COMPREHENSIVE METABOLIC PANEL  CBC   Imaging Review Dg Chest Port 1 View  06/26/2013   CLINICAL DATA:  Shortness of breath and wheezing for several hr  EXAM: PORTABLE CHEST - 1 VIEW  COMPARISON:  03/07/2013  FINDINGS: The lungs are hyperinflated likely secondary to COPD. There are bilateral small pleural effusions, right greater than left. There is bilateral interstitial thickening. There is right lower lobe airspace disease likely representing atelectasis. There is no pneumothorax.  The heart size is enlarged.  The osseous structures are unremarkable.  IMPRESSION: Findings most concerning for pulmonary edema. There is right lower lobe airspace disease which may reflect atelectasis, but superimposed pneumonia cannot be excluded.   Electronically Signed   By: Kathreen Devoid   On: 06/26/2013 11:36    EKG Interpretation    Date/Time:  Thursday June 26 2013 10:04:24 EST Ventricular Rate:  164 PR Interval:    QRS Duration: 119 QT Interval:  305 QTC Calculation: 504 R Axis:   -70 Text Interpretation:  Atrial fibrillation with rapid V-rate Left anterior fascicular block LVH with secondary repolarization abnormality Anterior infarct, acute (LAD) Since last tracing in MUSE  Rate faster Non-specific ST-t changes Confirmed by SHELDON  MD, CHARLES ([id number]) on 06/26/2013 10:14:36 AM            MDM   1. Acute CHF (congestive heart failure)   2. Atrial fibrillation with rapid ventricular response   3. Acute on chronic diastolic CHF (congestive heart failure), NYHA class 4   4. CHF (congestive heart failure)   5. Chronic anticoagulation    PT with Afib RVR on arrival, begun Cardizem drip with improved rate control, no hypotension. Labs and images reviewed, consistent with acute CHF. Discussed with Cardiology who has evaluated and asked that the Hospitalist admit the patient. Discussed Code Status with the husband. Pt does not want CPR, but he was unsure about intubation. HE requested additional discussion if her status were to deteriorate.     Charles B. Karle Starch, MD 06/26/13 2005

## 2013-06-26 NOTE — ED Notes (Signed)
Phlebotomy at the bedside  

## 2013-06-26 NOTE — Consult Note (Signed)
CARDIOLOGY CONSULT NOTE  Patient ID: Leah Harper MRN: [id number], DOB/AGE: 1919-09-07   Admit date: 06/26/2013 Date of Consult: 06/26/2013  Primary Physician: Nance Pear., NP Primary Cardiologist: Johnsie Cancel, MD Reason for Consultation: Atrial fibrillation  History of Present Illness Leah Harper is a 80 y.o. female with AF, HTN, advancing dementia and CKD who presents to the ED with SOB. She is accompanied by family. She had a negative nuclear study in 2003. Echo August 2014 - inferolateral HK, mild LVH, EF 50%, MAC, mild RAE. Last seen by Dr. Johnsie Cancel October 2014. She also has history of tachy-brady and her beta blocker has been discontinued. She has been maintained on rate control with short acting diltiazem and anticoagulation with Eliquis.  Her history is obtained from family members who are at the bedside. She and her husband had a restful evening, "nothing out of the ordinary and was uneventful." However, this morning when her caregiver arrived she felt Leah Harper's breathing was labored. 911 was called. On arrival here, she was found to have rapid AF and pulmonary edema on CXR. She has been given IV Lasix, [place] O2 and IV diltiazem with improvement. Her family report compliance with her medications. Her husband and daughter report she has not complained of chest pain, SOB or palpitations. She has not had syncope.  Past Medical History Past Medical History  Diagnosis Date  . Hyperlipidemia   . Hypertension   . AF (atrial fibrillation)   . Hyperthyroidism     remotely in 1941  . Normocytic anemia     2002  . S/P TAH-BSO (total abdominal hysterectomy and bilateral salpingo-oophorectomy)   . Allergy   . Arrhythmia   . Anxiety   . Dementia   . CHF (congestive heart failure)   . Kidney infection     history of   . GERD (gastroesophageal reflux disease)   . Rectal bleeding 05/11/2013    Past Surgical History Past Surgical History  Procedure Laterality Date  .  Tonsillectomy    . Total abdominal hysterectomy w/ bilateral salpingoophorectomy  1986  . Bilateral salpingoophorectomy    . Bladder suspension      bladder tack with TAHBSO    Allergies/Intolerances Allergies  Allergen Reactions  . Exelon [Rivastigmine]     Patch burns her skin and is unable to tolerate it.  . Statins     Myalgia   . Tylenol [Acetaminophen]     Facial rash    Current Home Medications      ALPRAZolam 0.25 MG tablet  Commonly known as:  XANAX  Take 1 tablet (0.25 mg total) by mouth 2 (two) times daily as needed. For anxiety     apixaban 2.5 MG Tabs tablet  Commonly known as:  ELIQUIS  Take 1 tablet (2.5 mg total) by mouth 2 (two) times daily.     diltiazem 90 MG tablet  Commonly known as:  CARDIZEM  Take 90 mg by mouth 2 (two) times daily.     docusate sodium 100 MG capsule  Commonly known as:  COLACE  Take 1 capsule (100 mg total) by mouth daily.     ENSURE  Take 237 mLs by mouth 2 (two) times daily between meals.     ezetimibe 10 MG tablet  Commonly known as:  ZETIA  Take 1 tablet (10 mg total) by mouth daily.     furosemide 20 MG tablet  Commonly known as:  LASIX  Take 2 tablets (40 mg total) by mouth daily.  megestrol 400 MG/10ML suspension  Commonly known as:  MEGACE  Take 400 mg by mouth daily.     omeprazole 20 MG capsule  Commonly known as:  PRILOSEC  Take 20 mg by mouth daily as needed (acid reflux).     traMADol 50 MG tablet  Commonly known as:  ULTRAM  Take 1 tablet (50 mg total) by mouth every 8 (eight) hours as needed.     Inpatient Medications   . diltiazem (CARDIZEM) infusion 15 mg/hr (06/26/13 1044)    Family History Family History  Problem Relation Age of Onset  . Heart disease Father   . Hyperlipidemia Father   . Heart attack Father   . Heart disease Brother     heart attack  . Heart disease Brother      Social History History   Social History  . Marital Status: Married    Spouse Name: Gwyndolyn Saxon     Number of Children: 3  . Years of Education: N/A   Occupational History  . retired    Social History Main Topics  . Smoking status: Former Smoker    Quit date: 07/17/1963  . Smokeless tobacco: Never Used  . Alcohol Use: No  . Drug Use: No  . Sexual Activity: Not on file   Other Topics Concern  . Not on file   Social History Narrative   Retired   Married -3 children    Tobacco abuse-no   Alcohol use-no     Regular exercise-no     Review of Systems General: No chills, fever, night sweats or weight changes  Cardiovascular:  No chest pain, dyspnea on exertion, edema, orthopnea, palpitations, paroxysmal nocturnal dyspnea Dermatological: No rash, lesions or masses Respiratory: No cough, dyspnea Urologic: No hematuria, dysuria Abdominal: No nausea, vomiting, diarrhea, bright red blood per rectum, melena, or hematemesis Neurologic: No visual changes, weakness, changes in mental status All other systems reviewed and are otherwise negative except as noted above.  Physical Exam Vitals: Blood pressure 149/74, pulse 107, temperature 99 F (37.2 C), temperature source Rectal, resp. rate 21, SpO2 100.00%.  General: Well developed, elderly 80 y.o. female in no acute distress. HEENT: Normocephalic, atraumatic. EOMs intact. Sclera nonicteric. Oropharynx clear.  Neck: Supple. No JVD. Lungs: Respirations regular and unlabored. Diminished breath sounds at bases bilaterally. No wheezes, rales or rhonchi. Heart: Irregular. S1, S2 present. No murmurs, rub, S3 or S4. Abdomen: Soft, non-tender, non-distended. BS present x 4 quadrants. No hepatosplenomegaly.  Extremities: No clubbing, cyanosis or edema. DP/PT/Radials 2+ and equal bilaterally. Neuro: Alert. Moves all extremities spontaneously. Musculoskeletal: Significant kyphosis. Skin: Intact. Warm and dry. Few small ecchymoses on left forearm and one on right shin. No rashes or petechiae in exposed areas.   Labs ProBNP 34,776  Recent  Labs  06/26/13 1025  TROPONINI <0.30   Lab Results  Component Value Date   WBC 9.5 06/26/2013   HGB 14.3 06/26/2013   HCT 42.0 06/26/2013   MCV 90.1 06/26/2013   PLT 264 06/26/2013    Recent Labs Lab 06/26/13 1024 06/26/13 1242  NA 141 140  K 4.3 3.6*  CL 100 102  CO2 23  --   BUN 31* 30*  CREATININE 2.12* 2.10*  CALCIUM 9.1  --   GLUCOSE 136* 175*    Recent Labs  06/26/13 1024  INR 1.47    Radiology/Studies Dg Chest Port 1 View  06/26/2013   CLINICAL DATA:  Shortness of breath and wheezing for several hr  EXAM: PORTABLE CHEST -  1 VIEW  COMPARISON:  03/07/2013  FINDINGS: The lungs are hyperinflated likely secondary to COPD. There are bilateral small pleural effusions, right greater than left. There is bilateral interstitial thickening. There is right lower lobe airspace disease likely representing atelectasis. There is no pneumothorax. The heart size is enlarged.  The osseous structures are unremarkable.  IMPRESSION: Findings most concerning for pulmonary edema. There is right lower lobe airspace disease which may reflect atelectasis, but superimposed pneumonia cannot be excluded.   Electronically Signed   By: Kathreen Devoid   On: 06/26/2013 11:36   Echocardiogram August 2014  Study Conclusions - Left ventricle: Hypokinesis of the inferolateral wall. Technically limited study. The cavity size was normal. Wall thickness was increased in a pattern of mild LVH. The estimated ejection fraction was 50%. - Aortic valve: Sclerosis without stenosis. - Mitral valve: Calcified annulus. - Right atrium: The atrium was mildly dilated.  12-lead ECG on arrival rapid AF at 164 bpm Telemetry AF, rate currently 93-105 bpm  Assessment and Plan 1. Atrial fibrillation w/RVR 2. Acute on chronic diastolic HF 3. Dementia  Ms. Dass presents with rapid AF and acute on chronic diastolic HF. Her rate is improved on IV diltiazem. Given her history of bradycardia while on diltiazem and BBs, will avoid  additional rate controlling agents at this time. We will plan to transition back to PO diltiazem over the next 24 hours. We recommend cautious diuresis with IV Lasix over the next 24-48 hours and will follow renal function closely. Maintain low sodium diet and daily weights. Continue low dose Eliquis for stroke prevention. Will follow with you.   Signed, Ileene Hutchinson, PA-C 06/26/2013, 1:02 PM   Attending note:  Patient seen and examined. Reviewed records and discussed with Ms. Edmisten PA-C. Patient presents with declining overall status but this morning, seemed to be more short of breath per caregiver at home. Reportedly she had a reasonable day yesterday, and has been on regular short acting Cardizem at home. She presented to the ER, noted to be in rapid atrial fibrillation and had evidence of pulmonary edema by chest x-ray. She is feeling better following IV Lasix, supplemental oxygen, and with better rate control on intravenous Cardizem. Medical history is complicated by advancing dementia with functional limitations, chronic hip pain (recent x-ray negative for fracture), also recent flexible sigmoidoscopy (was off Eliquis) showing inflamed distal colonic mucosa with internal and external small hemorrhoid - he had been having some rectal bleeding recently. PCP note from 12/23 also confirms general decline in status with poor PO intake. There was discussion had about the possibility of home hospice/palliative care consultation. Decision was to hold off at that point. Patient is now being admitted to the hospitalist service for further stabilization, we will assist with managing her atrial fibrillation. Of note, she has had significant bradycardia previously and the dose of diltiazem will need to be carefully titrated. She continues on Eliquis.  Satira Sark, M.D., F.A.C.C.

## 2013-06-26 NOTE — ED Notes (Signed)
Per [facility], pt from home for SOB that started this morning. Husband states her caregiver got there at 0900 and recognized she was having trouble breathing. 90% on RA, given 1 Duoneb and on an albuterol treatment on arrival. Pt in Afib with hx of same. Absent and wheezed initially in upper lobes and diminished in the lower lobes. Given 125 mg solumedrol.

## 2013-06-26 NOTE — ED Notes (Signed)
Dr. Karle Starch at the bedside.

## 2013-06-26 NOTE — H&P (Signed)
[facility] History and Physical  Leah Harper FUX:[id number] DOB: 1920/01/25 DOA: 06/26/2013  Referring physician: Emergency Department PCP: Nance Pear., NP  Specialists:   Chief Complaint: Afib RVR  HPI: Leah Harper is a 80 y.o. female  With a hx of afib, hypothyroid, hld, dementia, CHF, and GERD who presented to the ED with complaints of sob that started on the day of admit. In the ED, the patient was noted to have increased O2 requirements with afib in RVR. Also noted to have pulm edema. Cardiology was consulted and the patient was started on a cardizem gtt and lasix. The hospitalist service was asked to admit the patient given other multiple chronic medical issues as outlined below.  Review of Systems:  As per above, the remainder of the 10pt ros reviewed and are neg  Past Medical History  Diagnosis Date  . Hyperlipidemia   . Hypertension   . AF (atrial fibrillation)   . Hyperthyroidism     remotely in 1941  . Normocytic anemia     2002  . S/P TAH-BSO (total abdominal hysterectomy and bilateral salpingo-oophorectomy)   . Allergy   . Arrhythmia   . Anxiety   . Dementia   . CHF (congestive heart failure)   . Kidney infection     history of   . GERD (gastroesophageal reflux disease)   . Rectal bleeding 05/11/2013   Past Surgical History  Procedure Laterality Date  . Tonsillectomy    . Total abdominal hysterectomy w/ bilateral salpingoophorectomy  1986  . Bilateral salpingoophorectomy    . Bladder suspension      bladder tack with TAHBSO   Social History:  reports that she quit smoking about 49 years ago. She has never used smokeless tobacco. She reports that she does not drink alcohol or use illicit drugs.  where does patient live--home, ALF, SNF? and with whom if at home?  Can patient participate in ADLs?  Allergies  Allergen Reactions  . Exelon [Rivastigmine]     Patch burns her skin and is unable to tolerate it.  . Statins     Myalgia    . Tylenol [Acetaminophen]     Facial rash    Family History  Problem Relation Age of Onset  . Heart disease Father   . Hyperlipidemia Father   . Heart attack Father   . Heart disease Brother     heart attack  . Heart disease Brother     (be sure to complete)  Prior to Admission medications   Medication Sig Start Date End Date Taking? Authorizing Provider  ALPRAZolam (XANAX) 0.25 MG tablet Take 1 tablet (0.25 mg total) by mouth 2 (two) times daily as needed. For anxiety 02/28/13   Debbrah Alar, NP  apixaban (ELIQUIS) 2.5 MG TABS tablet Take 1 tablet (2.5 mg total) by mouth 2 (two) times daily. 02/18/13   Rhonda G Barrett, PA-C  diltiazem (CARDIZEM) 90 MG tablet Take 90 mg by mouth 2 (two) times daily. 03/06/13   Liliane Shi, PA-C  docusate sodium (COLACE) 100 MG capsule Take 1 capsule (100 mg total) by mouth daily. 05/08/13   Mosie Lukes, MD  ENSURE (ENSURE) Take 237 mLs by mouth 2 (two) times daily between meals.     Historical Provider, MD  ezetimibe (ZETIA) 10 MG tablet Take 1 tablet (10 mg total) by mouth daily. 06/17/13   Debbrah Alar, NP  furosemide (LASIX) 20 MG tablet Take 2 tablets (40 mg total) by mouth daily. 06/17/13  Debbrah Alar, NP  megestrol (MEGACE) 400 MG/10ML suspension Take 400 mg by mouth daily.    Historical Provider, MD  omeprazole (PRILOSEC) 20 MG capsule Take 20 mg by mouth daily as needed (acid reflux).     Historical Provider, MD  traMADol (ULTRAM) 50 MG tablet Take 1 tablet (50 mg total) by mouth every 8 (eight) hours as needed. 06/17/13   Debbrah Alar, NP   Physical Exam: Filed Vitals:   06/26/13 1245 06/26/13 1300 06/26/13 1316 06/26/13 1330  BP: 136/68 130/63 133/96 143/96  Pulse: 97 108 95 101  Temp:      TempSrc:      Resp: 20 22 22 22   SpO2: 100% 98% 99% 98%     General:  Awake, in nad  Eyes: PERRL B  ENT: membranes moist, dentition fair  Neck: trachea midline, neck supple  Cardiovascular: irregularly  irregular, s1, s2  Respiratory: normal resp effort, no wheezing  Abdomen: soft, nondistended  Skin: normal skin turgor, no abnormal skin lesions seen  Musculoskeletal: perfused, no clubbing  Psychiatric: mood/affect normal // no auditory/visual hallucinations  Neurologic: cn2-12 grossly intact, strength/sensation intact  Labs on Admission:  Basic Metabolic Panel:  Recent Labs Lab 06/26/13 1024 06/26/13 1242  NA 141 140  K 4.3 3.6*  CL 100 102  CO2 23  --   GLUCOSE 136* 175*  BUN 31* 30*  CREATININE 2.12* 2.10*  CALCIUM 9.1  --    Liver Function Tests: No results found for this basename: AST, ALT, ALKPHOS, BILITOT, PROT, ALBUMIN,  in the last 168 hours No results found for this basename: LIPASE, AMYLASE,  in the last 168 hours No results found for this basename: AMMONIA,  in the last 168 hours CBC:  Recent Labs Lab 06/26/13 1024 06/26/13 1242  WBC 9.5  --   NEUTROABS 5.4  --   HGB 13.0 14.3  HCT 40.2 42.0  MCV 90.1  --   PLT 264  --    Cardiac Enzymes:  Recent Labs Lab 06/26/13 1025  TROPONINI <0.30    BNP (last 3 results)  Recent Labs  02/12/13 1500 03/06/13 1714 06/26/13 1025  PROBNP 10352.0* 1047.0* 34776.0*   CBG: No results found for this basename: GLUCAP,  in the last 168 hours  Radiological Exams on Admission: Dg Chest Port 1 View  06/26/2013   CLINICAL DATA:  Shortness of breath and wheezing for several hr  EXAM: PORTABLE CHEST - 1 VIEW  COMPARISON:  03/07/2013  FINDINGS: The lungs are hyperinflated likely secondary to COPD. There are bilateral small pleural effusions, right greater than left. There is bilateral interstitial thickening. There is right lower lobe airspace disease likely representing atelectasis. There is no pneumothorax. The heart size is enlarged.  The osseous structures are unremarkable.  IMPRESSION: Findings most concerning for pulmonary edema. There is right lower lobe airspace disease which may reflect atelectasis, but  superimposed pneumonia cannot be excluded.   Electronically Signed   By: Kathreen Devoid   On: 06/26/2013 11:36    EKG: Independently reviewed. afib rvr  Assessment/Plan Principal Problem:   Atrial fibrillation with rapid ventricular response Active Problems:   HYPERLIPIDEMIA-MIXED   HYPERTENSION   Atrial fibrillation   Dementia   Chronic kidney disease, stage IV (severe)   Acute on chronic diastolic CHF (congestive heart failure), NYHA class 4   CHF (congestive heart failure)  1. Afib with RVR 1. Cardiology following 2. Cardizem gtt has been initiaated 3. On eliquis 4. HR currently improved on  gtt 5. Will admit to stepdown 2. acutely decompensated diastolic chf 1. Cardiology following 2. Given IV lasix in the ED 3. Follow i/o and renal fx closely, especially given hx of stage 4 ckd 3. Stage 4 CKD 1. Follow closely 2. Renal fx appears near baseline currently 3. Avoid nephrotoxic agents 4. HTN 1. BP appears to be stable 2. Cont meds for now 5. HLD 1. Cont home regimen 6. DVT prophylaxis 1. On eliquis   Code Status: Full (must indicate code status--if unknown or must be presumed, indicate so) Family Communication: Pt in room (indicate person spoken with, if applicable, with phone number if by telephone) Disposition Plan: Pending (indicate anticipated LOS)  Time spent: 12min  [facility], [facility] Pager [phone]  If 7PM-7AM, please contact night-coverage [email or web address] Password [email or web address] 06/26/2013, 2:06 PM

## 2013-06-27 ENCOUNTER — Telehealth: Payer: Self-pay | Admitting: Family

## 2013-06-27 LAB — CBC
HCT: 34.7 % — ABNORMAL LOW (ref 36.0–46.0)
HEMOGLOBIN: 12 g/dL (ref 12.0–15.0)
MCH: 30.6 pg (ref 26.0–34.0)
MCHC: 34.6 g/dL (ref 30.0–36.0)
MCV: 88.5 fL (ref 78.0–100.0)
Platelets: 221 10*3/uL (ref 150–400)
RBC: 3.92 MIL/uL (ref 3.87–5.11)
RDW: 15.9 % — ABNORMAL HIGH (ref 11.5–15.5)
WBC: 7.8 10*3/uL (ref 4.0–10.5)

## 2013-06-27 LAB — COMPREHENSIVE METABOLIC PANEL
ALBUMIN: 2.7 g/dL — AB (ref 3.5–5.2)
ALT: 11 U/L (ref 0–35)
AST: 21 U/L (ref 0–37)
Alkaline Phosphatase: 99 U/L (ref 39–117)
BUN: 34 mg/dL — ABNORMAL HIGH (ref 6–23)
CO2: 24 meq/L (ref 19–32)
CREATININE: 2.02 mg/dL — AB (ref 0.50–1.10)
Calcium: 8.7 mg/dL (ref 8.4–10.5)
Chloride: 101 mEq/L (ref 96–112)
GFR calc Af Amer: 24 mL/min — ABNORMAL LOW (ref >90)
GFR calc non Af Amer: 20 mL/min — ABNORMAL LOW (ref >90)
Glucose, Bld: 147 mg/dL — ABNORMAL HIGH (ref 70–99)
Potassium: 4 mEq/L (ref 3.7–5.3)
SODIUM: 141 meq/L (ref 137–147)
Total Bilirubin: 0.7 mg/dL (ref 0.3–1.2)
Total Protein: 6.6 g/dL (ref 6.0–8.3)

## 2013-06-27 LAB — MRSA PCR SCREENING: MRSA BY PCR: NEGATIVE

## 2013-06-27 MED ORDER — AMIODARONE HCL 200 MG PO TABS
200.0000 mg | ORAL_TABLET | Freq: Two times a day (BID) | ORAL | Status: DC
  Administered 2013-06-27 – 2013-06-28 (×4): 200 mg via ORAL
  Filled 2013-06-27 (×6): qty 1

## 2013-06-27 MED ORDER — FUROSEMIDE 10 MG/ML IJ SOLN
40.0000 mg | Freq: Two times a day (BID) | INTRAMUSCULAR | Status: DC
  Administered 2013-06-27 – 2013-06-28 (×2): 40 mg via INTRAVENOUS
  Filled 2013-06-27 (×4): qty 4

## 2013-06-27 MED ORDER — DILTIAZEM HCL 30 MG PO TABS
30.0000 mg | ORAL_TABLET | Freq: Three times a day (TID) | ORAL | Status: DC
  Administered 2013-06-27 – 2013-06-28 (×3): 30 mg via ORAL
  Filled 2013-06-27 (×6): qty 1

## 2013-06-27 MED ORDER — METOPROLOL TARTRATE 1 MG/ML IV SOLN
5.0000 mg | Freq: Four times a day (QID) | INTRAVENOUS | Status: DC
  Administered 2013-06-27 – 2013-06-28 (×6): 5 mg via INTRAVENOUS
  Filled 2013-06-27 (×10): qty 5

## 2013-06-27 NOTE — Progress Notes (Addendum)
[facility] Progress Note [name] [id number]:[id number] DOB: Jul 20, 1919 DOA: 06/26/2013 PCP: Nance Pear., NP  Brief narrative: 80 y/o female with h/o A-fib, CHF, hypothyroid, dementia and GERD admitted for dyspnea. Found to be hypoxic and in A-fib with RVR. Exam and Xray revealed pulmonary edema. Started on Cardizem and Lasix in ER and admitted.   Subjective: Very hard of hearing- no complaints. Daughter at bedside. Questions answered.  Assessment/Plan: Principal Problem:   Atrial fibrillation with rapid ventricular response -  Cardiology managing with Amiodarone and Cardizem - started on Apixaban  Active Problems:    Acute respiratory failure due to Acute on chronic diastolic CHF (congestive heart failure), NYHA class 4 - given a dose of Lasix on admission- negative balance by 2 L - will resume Lasix at 40 mg Q 12 hrs    HYPERLIPIDEMIA-MIXED - on Zetia as outpt    HYPERTENSION - BP Low normal- follow     Dementia - stable- has sitter at home    Chronic kidney disease, stage IV (severe) - stable  Anxiety - cont PRN Xanax  Code Status: Full code Family Communication: with daughter Disposition Plan: to be determined  Consultants: Cardiology   Procedures: none  Antibiotics: none  DVT prophylaxis: Apixaban  Objective: Blood pressure 113/39, pulse 63, temperature 97.7 F (36.5 C), temperature source Oral, resp. rate 17, height 5\' 4"  (1.626 m), weight 50.4 kg (111 lb 1.8 oz), SpO2 90.00%.  Intake/Output Summary (Last 24 hours) at 06/27/13 1254 Last data filed at 06/27/13 0900  Gross per 24 hour  Intake    210 ml  Output   2175 ml  Net  -1965 ml     Exam: General: No acute respiratory distress Lungs: poor air entry- 97 %  On 5 L O2 Cardiovascular: Irregular rate and rhythm without murmur gallop or rub normal S1 and S2 Abdomen: Nontender, nondistended, soft, bowel sounds positive, no rebound, no  ascites, no appreciable mass Extremities: No significant cyanosis, clubbing, or edema bilateral lower extremities  Data Reviewed: Basic Metabolic Panel:  Recent Labs Lab 06/26/13 1024 06/26/13 1242 06/26/13 1540 06/27/13 0635  NA 141 140  --  141  K 4.3 3.6*  --  4.0  CL 100 102  --  101  CO2 23  --   --  24  GLUCOSE 136* 175*  --  147*  BUN 31* 30*  --  34*  CREATININE 2.12* 2.10* 1.97* 2.02*  CALCIUM 9.1  --   --  8.7   Liver Function Tests:  Recent Labs Lab 06/27/13 0635  AST 21  ALT 11  ALKPHOS 99  BILITOT 0.7  PROT 6.6  ALBUMIN 2.7*   No results found for this basename: LIPASE, AMYLASE,  in the last 168 hours No results found for this basename: AMMONIA,  in the last 168 hours CBC:  Recent Labs Lab 06/26/13 1024 06/26/13 1242 06/26/13 1540 06/27/13 0635  WBC 9.5  --  6.6 7.8  NEUTROABS 5.4  --   --   --   HGB 13.0 14.3 12.6 12.0  HCT 40.2 42.0 37.1 34.7*  MCV 90.1  --  90.0 88.5  PLT 264  --  235 221   Cardiac Enzymes:  Recent Labs Lab 06/26/13 1025  TROPONINI <0.30   BNP (last 3 results)  Recent Labs  02/12/13 1500 03/06/13 1714 06/26/13 1025  PROBNP 10352.0* 1047.0* 34776.0*   CBG: No results found for this basename: GLUCAP,  in the last 168 hours  Recent Results (from the past 240 hour(s))  URINE CULTURE     Status: None   Collection Time    06/23/13  3:59 PM      Result Value Range Status   Colony Count 25,000 COLONIES/ML   Final   Organism ID, Bacteria Multiple bacterial morphotypes present, none   Final   Organism ID, Bacteria predominant. Suggest appropriate recollection if    Final   Organism ID, Bacteria clinically indicated.   Final  MRSA PCR SCREENING     Status: None   Collection Time    06/27/13  9:52 AM      Result Value Range Status   MRSA by PCR NEGATIVE  NEGATIVE Final   Comment:            The GeneXpert MRSA Assay (FDA     approved for NASAL specimens     only), is one component of a     comprehensive MRSA  colonization     surveillance program. It is not     intended to diagnose MRSA     infection nor to guide or     monitor treatment for     MRSA infections.     Studies:  Recent x-ray studies have been reviewed in detail by the Attending Physician  Scheduled Meds:  Scheduled Meds: . amiodarone  200 mg Oral BID  . apixaban  2.5 mg Oral BID  . diltiazem  30 mg Oral Q8H  . docusate sodium  100 mg Oral Daily  . ezetimibe  10 mg Oral Daily  . megestrol  400 mg Oral Daily  . metoprolol  5 mg Intravenous Q6H  . pantoprazole  40 mg Oral Daily  . sodium chloride  3 mL Intravenous Q12H   Continuous Infusions:   Time spent on care of this patient: 59 min   Debbe Odea, MD  [facility] Office  [phone] Pager - Text Page per Shea Evans as per below:  On-Call/Text Page:      [email or web address]      password [email or web address]  If 7PM-7AM, please contact night-coverage [email or web address] Password [email or web address] 06/27/2013, 12:54 PM   LOS: 1 day

## 2013-06-27 NOTE — Telephone Encounter (Signed)
Patient son Marcello Moores called in stating that patient is in the hospital. Marcello Moores states that the family would like to proceed with Hospice care and know that they would need an order for this. I explained to him that Lenna Sciara is out of the office until Monday but he wanted to know if another provider could give the order or can the physician at the hospital give the order?

## 2013-06-27 NOTE — Telephone Encounter (Signed)
Patient can either contact Hospice themselves and/or wait for PCP to place order on Monday, when returns to office/SLS

## 2013-06-27 NOTE — Telephone Encounter (Signed)
Informed patient son to call Hospice and tell them that they would like Debbrah Alar to be patients attending provider. Patient son states that they are "thinking about this" and will definitley let us know if they will proceed with Hospice.

## 2013-06-27 NOTE — Care Management Note (Signed)
    Page 1 of 1   06/27/2013     9:50:59 AM   CARE MANAGEMENT NOTE 06/27/2013  Patient:  Leah Harper, Leah Harper   Account Number:  [id number]  Date Initiated:  06/27/2013  Documentation initiated by:  Elissa Hefty  Subjective/Objective Assessment:   adm w at fib w rvr     Action/Plan:   lives w husband, pcp dr Donalee Citrin   Anticipated DC Date:     Anticipated DC Plan:           Choice offered to / List presented to:             Status of service:   Medicare Important Message given?   (If response is "NO", the following Medicare IM given date fields will be blank) Date Medicare IM given:   Date Additional Medicare IM given:    Discharge Disposition:    Per UR Regulation:  Reviewed for med. necessity/level of care/duration of stay  If discussed at [place] of Stay Meetings, dates discussed:    Comments:

## 2013-06-27 NOTE — Progress Notes (Signed)
Patient ID: Leah Harper, female   DOB: [birth year], 80 y.o.   MRN: [id number]    Subjective:  Denies SSCP, palpitations or Dyspnea Spoke with daughter at length   Objective:  Filed Vitals:   06/27/13 0403 06/27/13 0500 06/27/13 0600 06/27/13 0700  BP:  104/70 124/59 116/76  Pulse:  42 58 87  Temp: 98 F (36.7 C)     TempSrc: Axillary     Resp:  15 20 17   Height:      Weight:      SpO2:  93% 91% 90%    Intake/Output from previous day:  Intake/Output Summary (Last 24 hours) at 06/27/13 2229 Last data filed at 06/27/13 0700  Gross per 24 hour  Intake    180 ml  Output   2150 ml  Net  -1970 ml    Physical Exam: Affect appropriate Elderly demented frail female  HEENT: normal Neck supple with no adenopathy JVP normal no bruits no thyromegaly Lungs rhonchi  no wheezing and good diaphragmatic motion Heart:  S1/S2 no murmur, no rub, gallop or click PMI normal Abdomen: benighn, BS positve, no tenderness, no AAA no bruit.  No HSM or HJR Distal pulses intact with no bruits No edema Neuro non-focal Skin warm and dry No muscular weakness   Lab Results: Basic Metabolic Panel:  Recent Labs  06/26/13 1024 06/26/13 1242 06/26/13 1540  NA 141 140  --   K 4.3 3.6*  --   CL 100 102  --   CO2 23  --   --   GLUCOSE 136* 175*  --   BUN 31* 30*  --   CREATININE 2.12* 2.10* 1.97*  CALCIUM 9.1  --   --    CBC:  Recent Labs  06/26/13 1024  06/26/13 1540 06/27/13 0635  WBC 9.5  --  6.6 7.8  NEUTROABS 5.4  --   --   --   HGB 13.0  < > 12.6 12.0  HCT 40.2  < > 37.1 34.7*  MCV 90.1  --  90.0 88.5  PLT 264  --  235 221  < > = values in this interval not displayed. Cardiac Enzymes:  Recent Labs  06/26/13 1025  TROPONINI <0.30    Imaging: Dg Chest Port 1 View  06/26/2013   CLINICAL DATA:  Shortness of breath and wheezing for several hr  EXAM: PORTABLE CHEST - 1 VIEW  COMPARISON:  03/07/2013  FINDINGS: The lungs are hyperinflated likely secondary to COPD. There  are bilateral small pleural effusions, right greater than left. There is bilateral interstitial thickening. There is right lower lobe airspace disease likely representing atelectasis. There is no pneumothorax. The heart size is enlarged.  The osseous structures are unremarkable.  IMPRESSION: Findings most concerning for pulmonary edema. There is right lower lobe airspace disease which may reflect atelectasis, but superimposed pneumonia cannot be excluded.   Electronically Signed   By: Kathreen Devoid   On: 06/26/2013 11:36    Cardiac Studies:  ECG:  Rapid afib nonspecific ST T wave changes    Telemetry:  NSR PAC;s 06/27/2013   Echo: 02/14/13 EF 50%  Mild LAE AV sclerosis   Medications:   . apixaban  2.5 mg Oral BID  . docusate sodium  100 mg Oral Daily  . ezetimibe  10 mg Oral Daily  . megestrol  400 mg Oral Daily  . metoprolol  5 mg Intravenous Q6H  . pantoprazole  40 mg Oral Daily  . sodium chloride  3 mL Intravenous Q12H     . diltiazem (CARDIZEM) infusion 15 mg/hr (06/26/13 2257)    Assessment/Plan:  PAF:  With SSS  Continue apixaban.  Start amiodarone and change cardizem to PO   Dementia:  Husband of 74 years does a great job.  They have help at home 9:00-1:00 daily  3 children Also help.  Avoid aricept with bradycardia.  Social service consult, nutrition consult.  She has been eating A lot of potato chips which may have contributed to volume overload.   CHF:  ? Related to rapid afib, diastolic dysfunction and salt excess down 2L's with elevated Cr  Lasix as needed To keep I/O's negative Follow CXR   Jenkins Rouge 06/27/2013, 8:06 AM

## 2013-06-28 DIAGNOSIS — N179 Acute kidney failure, unspecified: Secondary | ICD-10-CM

## 2013-06-28 LAB — BASIC METABOLIC PANEL
BUN: 50 mg/dL — ABNORMAL HIGH (ref 6–23)
CO2: 25 meq/L (ref 19–32)
Calcium: 8.1 mg/dL — ABNORMAL LOW (ref 8.4–10.5)
Chloride: 97 mEq/L (ref 96–112)
Creatinine, Ser: 2.35 mg/dL — ABNORMAL HIGH (ref 0.50–1.10)
GFR calc Af Amer: 20 mL/min — ABNORMAL LOW (ref >90)
GFR calc non Af Amer: 17 mL/min — ABNORMAL LOW (ref >90)
Glucose, Bld: 122 mg/dL — ABNORMAL HIGH (ref 70–99)
Potassium: 4 mEq/L (ref 3.7–5.3)
SODIUM: 138 meq/L (ref 137–147)

## 2013-06-28 LAB — URINALYSIS, ROUTINE W REFLEX MICROSCOPIC
Bilirubin Urine: NEGATIVE
GLUCOSE, UA: NEGATIVE mg/dL
Hgb urine dipstick: NEGATIVE
Ketones, ur: NEGATIVE mg/dL
Leukocytes, UA: NEGATIVE
Nitrite: NEGATIVE
Protein, ur: NEGATIVE mg/dL
Specific Gravity, Urine: 1.006 (ref 1.005–1.030)
UROBILINOGEN UA: 0.2 mg/dL (ref 0.0–1.0)
pH: 7 (ref 5.0–8.0)

## 2013-06-28 LAB — GLUCOSE, CAPILLARY: GLUCOSE-CAPILLARY: 113 mg/dL — AB (ref 70–99)

## 2013-06-28 MED ORDER — HYDROCORTISONE ACETATE 25 MG RE SUPP
25.0000 mg | Freq: Two times a day (BID) | RECTAL | Status: DC
  Filled 2013-06-28 (×4): qty 1

## 2013-06-28 MED ORDER — FUROSEMIDE 40 MG PO TABS
40.0000 mg | ORAL_TABLET | Freq: Every day | ORAL | Status: DC
Start: 2013-06-29 — End: 2013-06-29
  Administered 2013-06-29: 40 mg via ORAL
  Filled 2013-06-28: qty 1

## 2013-06-28 MED ORDER — DILTIAZEM HCL ER COATED BEADS 120 MG PO CP24
120.0000 mg | ORAL_CAPSULE | Freq: Every day | ORAL | Status: DC
  Administered 2013-06-28 – 2013-06-29 (×2): 120 mg via ORAL
  Filled 2013-06-28 (×4): qty 1

## 2013-06-28 NOTE — Progress Notes (Signed)
[facility] Progress Note [place] TEAM 1 - Stepdown/ICU TEAM   RONDELL FRICK AST:[id number] DOB: [birth year] DOA: 06/26/2013 PCP: Nance Pear., NP  Brief narrative: 80 y/o female with h/o A-fib, CHF, hypothyroid, dementia and GERD admitted for dyspnea. Found to be hypoxic and in A-fib with RVR. Exam and Xray revealed pulmonary edema. Started on Cardizem and Lasix in ER and admitted.   Subjective: Very hard of hearing- no complaints. Daughter at bedside. Questions answered.  Assessment/Plan: Principal Problem:   Atrial fibrillation with rapid ventricular response -  Cardiology managing with Amiodarone and Cardizem - started on Apixaban  Active Problems:    Acute respiratory failure due to Acute on chronic diastolic CHF (congestive heart failure), NYHA class 4 - has diuresed about 3 L - Cr bump noted and therefore Lasix now held  Urinary retention - foley removed however pt did not void- noted to have > 500 cc in bladder on bladder scan - I and O cath once- send urine for culture as pt c/o "burning"  - if unable to void after I and O cath, will place foley again    HYPERLIPIDEMIA-MIXED - on Zetia as outpt    HYPERTENSION - BP Low normal- follow     Dementia - stable- has sitter at home who has been with her in the hospital as well    Chronic kidney disease, stage IV (severe) - slight bump in Cr with diuretic which is now on hold  Anxiety - cont PRN Xanax  Code Status: Full code Family Communication: with daughter, son and husband Disposition Plan: home   Consultants: Cardiology   Procedures: none  Antibiotics: none  DVT prophylaxis: Apixaban  Objective: Blood pressure 159/73, pulse 69, temperature 97.7 F (36.5 C), temperature source Axillary, resp. rate 23, height 5\' 4"  (1.626 m), weight 50.4 kg (111 lb 1.8 oz), SpO2 91.00%.  Intake/Output Summary (Last 24 hours) at 06/28/13 1426 Last data filed at 06/28/13 1300  Gross per 24 hour   Intake    840 ml  Output   1815 ml  Net   -975 ml     Exam: General: No acute respiratory distress Lungs: poor air entry- 91 %  On room air Cardiovascular: Regular rate and rhythm  Abdomen: Nontender, nondistended, soft, bowel sounds positive, no rebound, no ascites, no appreciable mass Extremities: No significant cyanosis, clubbing, or edema bilateral lower extremities  Data Reviewed: Basic Metabolic Panel:  Recent Labs Lab 06/26/13 1024 06/26/13 1242 06/26/13 1540 06/27/13 0635 06/28/13 0540  NA 141 140  --  141 138  K 4.3 3.6*  --  4.0 4.0  CL 100 102  --  101 97  CO2 23  --   --  24 25  GLUCOSE 136* 175*  --  147* 122*  BUN 31* 30*  --  34* 50*  CREATININE 2.12* 2.10* 1.97* 2.02* 2.35*  CALCIUM 9.1  --   --  8.7 8.1*   Liver Function Tests:  Recent Labs Lab 06/27/13 0635  AST 21  ALT 11  ALKPHOS 99  BILITOT 0.7  PROT 6.6  ALBUMIN 2.7*   No results found for this basename: LIPASE, AMYLASE,  in the last 168 hours No results found for this basename: AMMONIA,  in the last 168 hours CBC:  Recent Labs Lab 06/26/13 1024 06/26/13 1242 06/26/13 1540 06/27/13 0635  WBC 9.5  --  6.6 7.8  NEUTROABS 5.4  --   --   --   HGB 13.0 14.3 12.6  12.0  HCT 40.2 42.0 37.1 34.7*  MCV 90.1  --  90.0 88.5  PLT 264  --  235 221   Cardiac Enzymes:  Recent Labs Lab 06/26/13 1025  TROPONINI <0.30   BNP (last 3 results)  Recent Labs  02/12/13 1500 03/06/13 1714 06/26/13 1025  PROBNP 10352.0* 1047.0* 34776.0*   CBG: No results found for this basename: GLUCAP,  in the last 168 hours  Recent Results (from the past 240 hour(s))  URINE CULTURE     Status: None   Collection Time    06/23/13  3:59 PM      Result Value Range Status   Colony Count 25,000 COLONIES/ML   Final   Organism ID, Bacteria Multiple bacterial morphotypes present, none   Final   Organism ID, Bacteria predominant. Suggest appropriate recollection if    Final   Organism ID, Bacteria  clinically indicated.   Final  MRSA PCR SCREENING     Status: None   Collection Time    06/27/13  9:52 AM      Result Value Range Status   MRSA by PCR NEGATIVE  NEGATIVE Final   Comment:            The GeneXpert MRSA Assay (FDA     approved for NASAL specimens     only), is one component of a     comprehensive MRSA colonization     surveillance program. It is not     intended to diagnose MRSA     infection nor to guide or     monitor treatment for     MRSA infections.     Studies:  Recent x-ray studies have been reviewed in detail by the Attending Physician  Scheduled Meds:  Scheduled Meds: . amiodarone  200 mg Oral BID  . apixaban  2.5 mg Oral BID  . diltiazem  120 mg Oral Daily  . diltiazem  30 mg Oral Q8H  . docusate sodium  100 mg Oral Daily  . ezetimibe  10 mg Oral Daily  . [START ON 06/29/2013] furosemide  40 mg Oral Daily  . hydrocortisone  25 mg Rectal BID  . megestrol  400 mg Oral Daily  . pantoprazole  40 mg Oral Daily  . sodium chloride  3 mL Intravenous Q12H   Continuous Infusions:   Time spent on care of this patient: 22 min   Debbe Odea, MD  [facility] Office  [phone] Pager - Text Page per Shea Evans as per below:  On-Call/Text Page:      [email or web address]      password [email or web address]  If 7PM-7AM, please contact night-coverage [email or web address] Password [email or web address] 06/28/2013, 2:26 PM   LOS: 2 days

## 2013-06-28 NOTE — Progress Notes (Addendum)
Patient ID: Leah Harper, female   DOB: 1919-11-20, 80 y.o.   MRN: [id number]   SUBJECTIVE: Drowsy (did not sleep well).  Denies dyspnea or chest pain.  Remains in NSR.  Has not been out of bed.  Marland Kitchen amiodarone  200 mg Oral BID  . apixaban  2.5 mg Oral BID  . diltiazem  120 mg Oral Daily  . diltiazem  30 mg Oral Q8H  . docusate sodium  100 mg Oral Daily  . ezetimibe  10 mg Oral Daily  . [START ON 06/29/2013] furosemide  40 mg Oral Daily  . megestrol  400 mg Oral Daily  . pantoprazole  40 mg Oral Daily  . sodium chloride  3 mL Intravenous Q12H     Filed Vitals:   06/28/13 0400 06/28/13 0500 06/28/13 0610 06/28/13 0744  BP: 137/67  138/65 138/65  Pulse: 67 65  67  Temp: 98.7 F (37.1 C)   98.8 F (37.1 C)  TempSrc: Oral   Axillary  Resp: 22 14  20   Height:      Weight:      SpO2: 95% 95%  95%    Intake/Output Summary (Last 24 hours) at 06/28/13 0916 Last data filed at 06/28/13 0800  Gross per 24 hour  Intake    600 ml  Output   1315 ml  Net   -715 ml    LABS: Basic Metabolic Panel:  Recent Labs  06/27/13 0635 06/28/13 0540  NA 141 138  K 4.0 4.0  CL 101 97  CO2 24 25  GLUCOSE 147* 122*  BUN 34* 50*  CREATININE 2.02* 2.35*  CALCIUM 8.7 8.1*   Liver Function Tests:  Recent Labs  06/27/13 0635  AST 21  ALT 11  ALKPHOS 99  BILITOT 0.7  PROT 6.6  ALBUMIN 2.7*   No results found for this basename: LIPASE, AMYLASE,  in the last 72 hours CBC:  Recent Labs  06/26/13 1024  06/26/13 1540 06/27/13 0635  WBC 9.5  --  6.6 7.8  NEUTROABS 5.4  --   --   --   HGB 13.0  < > 12.6 12.0  HCT 40.2  < > 37.1 34.7*  MCV 90.1  --  90.0 88.5  PLT 264  --  235 221  < > = values in this interval not displayed. Cardiac Enzymes:  Recent Labs  06/26/13 1025  TROPONINI <0.30   BNP: No components found with this basename: POCBNP,  D-Dimer: No results found for this basename: DDIMER,  in the last 72 hours Hemoglobin A1C: No results found for this basename:  HGBA1C,  in the last 72 hours Fasting Lipid Panel: No results found for this basename: CHOL, HDL, LDLCALC, TRIG, CHOLHDL, LDLDIRECT,  in the last 72 hours Thyroid Function Tests: No results found for this basename: TSH, T4TOTAL, FREET3, T3FREE, THYROIDAB,  in the last 72 hours Anemia Panel: No results found for this basename: VITAMINB12, FOLATE, FERRITIN, TIBC, IRON, RETICCTPCT,  in the last 72 hours  RADIOLOGY: Dg Hip Complete Right  06/17/2013   CLINICAL DATA:  Right hip pain, no known injury  EXAM: RIGHT HIP - COMPLETE 2+ VIEW  COMPARISON:  01/28/2013  FINDINGS: Three views of the right hip submitted. No acute fracture or subluxation. Diffuse osteopenia again noted.  IMPRESSION: Negative.  Diffuse osteopenia again noted.   Electronically Signed   By: Lahoma Crocker M.D.   On: 06/17/2013 14:49   Dg Chest Port 1 View  06/26/2013  CLINICAL DATA:  Shortness of breath and wheezing for several hr  EXAM: PORTABLE CHEST - 1 VIEW  COMPARISON:  03/07/2013  FINDINGS: The lungs are hyperinflated likely secondary to COPD. There are bilateral small pleural effusions, right greater than left. There is bilateral interstitial thickening. There is right lower lobe airspace disease likely representing atelectasis. There is no pneumothorax. The heart size is enlarged.  The osseous structures are unremarkable.  IMPRESSION: Findings most concerning for pulmonary edema. There is right lower lobe airspace disease which may reflect atelectasis, but superimposed pneumonia cannot be excluded.   Electronically Signed   By: Kathreen Devoid   On: 06/26/2013 11:36    PHYSICAL EXAM General: NAD Neck: JVP 8 cm, no thyromegaly or thyroid nodule.  Lungs: Crackles at bases bilaterally CV: Nondisplaced PMI.  Heart regular S1/S2, no S3/S4, 1/6 SEM.  No peripheral edema.   Abdomen: Soft, nontender, no hepatosplenomegaly, no distention.  Neurologic: Drowsy but awakens and answers questions Psych: Normal affect. Extremities: No  clubbing or cyanosis.   TELEMETRY: Reviewed telemetry pt in NSR  ASSESSMENT AND PLAN: 80 yo with history of PAF/tachy-brady, dementia, and CKD presented with atrial fibrillation/RVR + acute on chronic diastolic CHF.  1. Atrial fibrillation: Patient is back in NSR now.   - Maintain NSR with amiodarone - She is on reduced-dose apixaban. - Given tachy-brady history, would not have her on both beta blocker and diltiazem.  Stop IV metoprolol and consolidate diltiazem to diltiazem CD 120 mg daily.  2. Acute on chronic diastolic CHF: EF 99% on last echo.  She probably has some mild residual volume overload but creatinine has been increasing.  She has diuresed on IV Lasix.  Put her back on her home dose of po Lasix starting tomorrow.  3. AKI on CKD: As above, stopping IV Lasix.  4. Will get PT consult to mobilize patient.  Also, ok to go to telemetry from my standpoint.  May help her to sleep better.   Loralie Champagne 06/28/2013 9:20 AM

## 2013-06-29 DIAGNOSIS — F0390 Unspecified dementia without behavioral disturbance: Secondary | ICD-10-CM

## 2013-06-29 LAB — BASIC METABOLIC PANEL
BUN: 52 mg/dL — ABNORMAL HIGH (ref 6–23)
CALCIUM: 8.7 mg/dL (ref 8.4–10.5)
CO2: 27 meq/L (ref 19–32)
Chloride: 95 mEq/L — ABNORMAL LOW (ref 96–112)
Creatinine, Ser: 2.28 mg/dL — ABNORMAL HIGH (ref 0.50–1.10)
GFR calc non Af Amer: 18 mL/min — ABNORMAL LOW (ref >90)
GFR, EST AFRICAN AMERICAN: 20 mL/min — AB (ref >90)
Glucose, Bld: 93 mg/dL (ref 70–99)
Potassium: 3.9 mEq/L (ref 3.7–5.3)
SODIUM: 136 meq/L — AB (ref 137–147)

## 2013-06-29 MED ORDER — DILTIAZEM HCL ER COATED BEADS 120 MG PO CP24: 120.0000 mg | ORAL_CAPSULE | Freq: Every day | ORAL | Status: DC

## 2013-06-29 MED ORDER — AMIODARONE HCL 200 MG PO TABS
200.0000 mg | ORAL_TABLET | Freq: Every day | ORAL | Status: DC
  Administered 2013-06-29: 09:00:00 200 mg via ORAL
  Filled 2013-06-29: qty 1

## 2013-06-29 MED ORDER — AMIODARONE HCL 200 MG PO TABS: 200.0000 mg | ORAL_TABLET | Freq: Every day | ORAL | Status: DC

## 2013-06-29 MED ORDER — DILTIAZEM HCL 60 MG PO TABS: 60.0000 mg | ORAL_TABLET | Freq: Two times a day (BID) | ORAL | Status: DC

## 2013-06-29 NOTE — Discharge Summary (Addendum)
PATIENT DETAILS Name: Leah Harper Age: 80 y.o. Sex: female Date of Birth: 08-23-1919 MRN: [id number]. Admit Date: 06/26/2013 Admitting Physician: Donne Hazel, MD PCP:O'SULLIVAN,MELISSA S., NP  Recommendations for Outpatient Follow-up:  BMET in one week  PRIMARY DISCHARGE DIAGNOSIS:  Principal Problem:   Atrial fibrillation with rapid ventricular response Active Problems:   HYPERLIPIDEMIA-MIXED   HYPERTENSION   Atrial fibrillation   Dementia   Chronic kidney disease, stage IV (severe)   Acute on chronic diastolic CHF (congestive heart failure), NYHA class 4   CHF (congestive heart failure)   Atrial fibrillation with RVR      PAST MEDICAL HISTORY: Past Medical History  Diagnosis Date  . Hyperlipidemia   . Hypertension   . AF (atrial fibrillation)   . Hyperthyroidism     remotely in 1941  . Normocytic anemia     2002  . S/P TAH-BSO (total abdominal hysterectomy and bilateral salpingo-oophorectomy)   . Allergy   . Arrhythmia   . Anxiety   . Dementia   . CHF (congestive heart failure)   . Kidney infection     history of   . GERD (gastroesophageal reflux disease)   . Rectal bleeding 05/11/2013    DISCHARGE MEDICATIONS:   Medication List         ALPRAZolam 0.25 MG tablet  Commonly known as:  XANAX  Take 1 tablet (0.25 mg total) by mouth 2 (two) times daily as needed. For anxiety     amiodarone 200 MG tablet  Commonly known as:  PACERONE  Take 1 tablet (200 mg total) by mouth daily.     apixaban 2.5 MG Tabs tablet  Commonly known as:  ELIQUIS  Take 1 tablet (2.5 mg total) by mouth 2 (two) times daily.     diltiazem 60 MG tablet  Commonly known as:  CARDIZEM  Take 1 tablet (60 mg total) by mouth 2 (two) times daily.     docusate sodium 100 MG capsule  Commonly known as:  COLACE  Take 1 capsule (100 mg total) by mouth daily.     ENSURE  Take 237 mLs by mouth 2 (two) times daily between meals.     ezetimibe 10 MG tablet  Commonly known as:  ZETIA   Take 1 tablet (10 mg total) by mouth daily.     furosemide 20 MG tablet  Commonly known as:  LASIX  Take 2 tablets (40 mg total) by mouth daily.     megestrol 400 MG/10ML suspension  Commonly known as:  MEGACE  Take 400 mg by mouth daily.     omeprazole 20 MG capsule  Commonly known as:  PRILOSEC  Take 20 mg by mouth daily as needed (acid reflux).     traMADol 50 MG tablet  Commonly known as:  ULTRAM  Take 50 mg by mouth every 8 (eight) hours as needed for moderate pain.        ALLERGIES:   Allergies  Allergen Reactions  . Exelon [Rivastigmine]     Patch burns her skin and is unable to tolerate it.  . Statins     Myalgia   . Tylenol [Acetaminophen]     Facial rash    BRIEF HPI:  See H&P, Labs, Consult and Test reports for all details in brief, 80 y/o female with h/o A-fib, CHF, hypothyroid, dementia and GERD admitted for dyspnea. Found to be hypoxic and in A-fib with RVR. Exam and Xray revealed pulmonary edema. Started on Cardizem and Lasix in ER  and admitted.   CONSULTATIONS:   cardiology  PERTINENT RADIOLOGIC STUDIES: Dg Hip Complete Right  06/17/2013   CLINICAL DATA:  Right hip pain, no known injury  EXAM: RIGHT HIP - COMPLETE 2+ VIEW  COMPARISON:  01/28/2013  FINDINGS: Three views of the right hip submitted. No acute fracture or subluxation. Diffuse osteopenia again noted.  IMPRESSION: Negative.  Diffuse osteopenia again noted.   Electronically Signed   By: Lahoma Crocker M.D.   On: 06/17/2013 14:49   Dg Chest Port 1 View  06/26/2013   CLINICAL DATA:  Shortness of breath and wheezing for several hr  EXAM: PORTABLE CHEST - 1 VIEW  COMPARISON:  03/07/2013  FINDINGS: The lungs are hyperinflated likely secondary to COPD. There are bilateral small pleural effusions, right greater than left. There is bilateral interstitial thickening. There is right lower lobe airspace disease likely representing atelectasis. There is no pneumothorax. The heart size is enlarged.  The osseous  structures are unremarkable.  IMPRESSION: Findings most concerning for pulmonary edema. There is right lower lobe airspace disease which may reflect atelectasis, but superimposed pneumonia cannot be excluded.   Electronically Signed   By: Kathreen Devoid   On: 06/26/2013 11:36     PERTINENT LAB RESULTS: CBC:  Recent Labs  06/26/13 1540 06/27/13 0635  WBC 6.6 7.8  HGB 12.6 12.0  HCT 37.1 34.7*  PLT 235 221   CMET CMP     Component Value Date/Time   NA 136* 06/29/2013 0525   K 3.9 06/29/2013 0525   CL 95* 06/29/2013 0525   CO2 27 06/29/2013 0525   GLUCOSE 93 06/29/2013 0525   BUN 52* 06/29/2013 0525   CREATININE 2.28* 06/29/2013 0525   CREATININE 2.24* 06/17/2013 1501   CALCIUM 8.7 06/29/2013 0525   PROT 6.6 06/27/2013 0635   ALBUMIN 2.7* 06/27/2013 0635   AST 21 06/27/2013 0635   ALT 11 06/27/2013 0635   ALKPHOS 99 06/27/2013 0635   BILITOT 0.7 06/27/2013 0635   GFRNONAA 18* 06/29/2013 0525   GFRAA 20* 06/29/2013 0525    GFR Estimated Creatinine Clearance: 11.9 ml/min (by C-G formula based on Cr of 2.28). No results found for this basename: LIPASE, AMYLASE,  in the last 72 hours No results found for this basename: CKTOTAL, CKMB, CKMBINDEX, TROPONINI,  in the last 72 hours No components found with this basename: POCBNP,  No results found for this basename: DDIMER,  in the last 72 hours No results found for this basename: HGBA1C,  in the last 72 hours No results found for this basename: CHOL, HDL, LDLCALC, TRIG, CHOLHDL, LDLDIRECT,  in the last 72 hours No results found for this basename: TSH, T4TOTAL, FREET3, T3FREE, THYROIDAB,  in the last 72 hours No results found for this basename: VITAMINB12, FOLATE, FERRITIN, TIBC, IRON, RETICCTPCT,  in the last 72 hours Coags: No results found for this basename: PT, INR,  in the last 72 hours Microbiology: Recent Results (from the past 240 hour(s))  URINE CULTURE     Status: None   Collection Time    06/23/13  3:59 PM      Result Value Range Status   Colony  Count 25,000 COLONIES/ML   Final   Organism ID, Bacteria Multiple bacterial morphotypes present, none   Final   Organism ID, Bacteria predominant. Suggest appropriate recollection if    Final   Organism ID, Bacteria clinically indicated.   Final  MRSA PCR SCREENING     Status: None   Collection Time  06/27/13  9:52 AM      Result Value Range Status   MRSA by PCR NEGATIVE  NEGATIVE Final   Comment:            The GeneXpert MRSA Assay (FDA     approved for NASAL specimens     only), is one component of a     comprehensive MRSA colonization     surveillance program. It is not     intended to diagnose MRSA     infection nor to guide or     monitor treatment for     MRSA infections.     BRIEF HOSPITAL COURSE:  Atrial fibrillation with rapid ventricular response -patient was found to be be in Afib with RVR, started on Cardizem gtt, was already on Eliquis prior to admit, this was continued.Cardiology was consulted, patient was started on Amiodarone,she subsequently converted back to NSR. Given tachy-brady syndrome, beta blocker was not started, cardizem has been changed to sustained release form. Spoke with Cards-Dr Hochrein on 06/29/13-he suggested we change Amidarone to  200 mg daily from 200 mg BID. Since doing well, and in NSR, patient is being discharged home in a stable condition.  Addendum -patient unable to swallow the cardizem long acting capsule, spoke with pharmacy-no other alternative-therefore will place on Cardizem 60 mg BID.  Acute on chronic diastolic CHF -She was diuresed on IV Lasix.She has been placed back on her home dose of po Lasix -given mild acute on chronic Kidney disease, recommendations are to follow BMET in one week,  Chronic kidney disease, stage IV (severe) -slight bump in creatinine during this admit, subsequently lasix was briefly held. Since creatinine now downtrending, lasix 40 mg resumed.  Acute respiratory failure -Hypoxic -due to Acute on chronic  diastolic CHF (congestive heart failure -resolved with Diuresis  Urinary retention -resolved -this occurred after foley was removed. Patient required I&O X 1, but now spontaneously voiding.  Dementia  - stable  HYPERLIPIDEMIA-MIXED  - on Zetia as outpt  TODAY-DAY OF DISCHARGE:  Subjective:   Ahmiya Abee today has no headache,no chest abdominal pain,no new weakness tingling or numbness, no issues overnight, daughter at bedside, and wants to take her home today.  Objective:   Blood pressure 119/80, pulse 65, temperature 97.7 F (36.5 C), temperature source Oral, resp. rate 20, height 5\' 4"  (1.626 m), weight 47 kg (103 lb 9.9 oz), SpO2 96.00%.  Intake/Output Summary (Last 24 hours) at 06/29/13 1122 Last data filed at 06/29/13 0654  Gross per 24 hour  Intake    360 ml  Output    700 ml  Net   -340 ml   Filed Weights   06/26/13 1600 06/29/13 0511  Weight: 50.4 kg (111 lb 1.8 oz) 47 kg (103 lb 9.9 oz)    Exam Awake Alert, Oriented *3, No new F.N deficits, Normal affect [place].AT,PERRAL Supple Neck,No JVD, No cervical lymphadenopathy appriciated.  Symmetrical Chest wall movement, Good air movement bilaterally, CTAB RRR,No Gallops,Rubs or new Murmurs, No Parasternal Heave +ve B.Sounds, Abd Soft, Non tender, No organomegaly appriciated, No rebound -guarding or rigidity. No Cyanosis, Clubbing or edema, No new Rash or bruise  DISCHARGE CONDITION: Stable  DISPOSITION: Home with home health services  DISCHARGE INSTRUCTIONS:    Activity:  As tolerated with Full fall precautions use walker/cane & assistance as needed  Diet recommendation: Heart Healthy diet Fluid restriction 1.8 lit/day   Discharge Orders   Future Orders Complete By Expires   (HEART FAILURE PATIENTS) Call MD:  Anytime you have any of the following symptoms: 1) 3 pound weight gain in 24 hours or 5 pounds in 1 week 2) shortness of breath, with or without a dry hacking cough 3) swelling in the hands, feet or  stomach 4) if you have to sleep on extra pillows at night in order to breathe.  As directed    Diet - low sodium heart healthy  As directed    Increase activity slowly  As directed       Follow-up Information   Follow up with Nance Pear., NP. Schedule an appointment as soon as possible for a visit in 1 week.   Specialty:  Internal Medicine   Contact information:   [place] [address] [phone]       Follow up with Jenkins Rouge, MD. Schedule an appointment as soon as possible for a visit in 2 weeks.   Specialty:  Cardiology   Contact information:   [id number] [place] [address] [phone]       Follow up with [facility]. (for home health physical therapy and nurse to draw labs)    Contact information:   [place], [place] [address]      Total Time spent on discharge equals 45 minutes.  SignedOren Binet 06/29/2013 11:22 AM

## 2013-06-29 NOTE — Progress Notes (Signed)
   CARE MANAGEMENT NOTE 06/29/2013  Patient:  Leah Harper, Leah Harper   Account Number:  [id number]  Date Initiated:  06/27/2013  Documentation initiated by:  Elissa Hefty  Subjective/Objective Assessment:   adm w at fib w rvr     Action/Plan:   lives w husband, pcp dr Leah Harper   Anticipated DC Date:  06/29/2013   Anticipated DC Plan:  [place]  CM consult      [facility] Choice  HOME HEALTH   Choice offered to / List presented to:  C-3 Spouse        HH arranged  HH-1 RN  [place]   Status of service:  Completed, signed off Medicare Important Message given?   (If response is "NO", the following Medicare IM given date fields will be blank) Date Medicare IM given:   Date Additional Medicare IM given:    Discharge Disposition:  [place]  Per UR Regulation:  Reviewed for med. necessity/level of care/duration of stay  If discussed at Long Length of Stay Meetings, dates discussed:    Comments:  1415 11:00 CM spoke with pt's husband, son, and daughter in pt's room to arrange HHPT/RN.  [facility] was chosen and contact was made to [facility] who requested facesheet, orders, DC summary, H&P, and PT note be faxed to [phone] to be addresssed in the morning when the office is open.  No other CM needs were communicated.  Address and contact information was verified and two other contact numbers were given:  [phone] and [phone].  Mariane Masters, BSN, [place], [phone].

## 2013-06-29 NOTE — Progress Notes (Signed)
   SUBJECTIVE:  She denies SOB   PHYSICAL EXAM Filed Vitals:   06/28/13 1210 06/28/13 1500 06/28/13 2024 06/29/13 0511  BP: 159/73 127/57 132/68 119/80  Pulse: 69 61 65   Temp: 97.7 F (36.5 C) 97.4 F (36.3 C) 98.1 F (36.7 C) 97.7 F (36.5 C)  TempSrc: Axillary  Oral Oral  Resp: 23  20 20   Height:      Weight:    103 lb 9.9 oz (47 kg)  SpO2: 91% 95% 97% 96%   General:  No distress Lungs:  Clear Heart:  RRR Abdomen:  Positive bowel sounds, no rebound no guarding Extremities: No edema  LABS:  Results for orders placed during the hospital encounter of 06/26/13 (from the past 24 hour(s))  URINALYSIS, ROUTINE W REFLEX MICROSCOPIC     Status: None   Collection Time    06/28/13  1:18 PM      Result Value Range   Color, Urine YELLOW  YELLOW   APPearance CLEAR  CLEAR   Specific Gravity, Urine 1.006  1.005 - 1.030   pH 7.0  5.0 - 8.0   Glucose, UA NEGATIVE  NEGATIVE mg/dL   Hgb urine dipstick NEGATIVE  NEGATIVE   Bilirubin Urine NEGATIVE  NEGATIVE   Ketones, ur NEGATIVE  NEGATIVE mg/dL   Protein, ur NEGATIVE  NEGATIVE mg/dL   Urobilinogen, UA 0.2  0.0 - 1.0 mg/dL   Nitrite NEGATIVE  NEGATIVE   Leukocytes, UA NEGATIVE  NEGATIVE  GLUCOSE, CAPILLARY     Status: Abnormal   Collection Time    06/28/13  9:54 PM      Result Value Range   Glucose-Capillary 113 (*) 70 - 99 mg/dL   Comment 1 Notify RN    BASIC METABOLIC PANEL     Status: Abnormal   Collection Time    06/29/13  5:25 AM      Result Value Range   Sodium 136 (*) 137 - 147 mEq/L   Potassium 3.9  3.7 - 5.3 mEq/L   Chloride 95 (*) 96 - 112 mEq/L   CO2 27  19 - 32 mEq/L   Glucose, Bld 93  70 - 99 mg/dL   BUN 52 (*) 6 - 23 mg/dL   Creatinine, Ser 2.28 (*) 0.50 - 1.10 mg/dL   Calcium 8.7  8.4 - 10.5 mg/dL   GFR calc non Af Amer 18 (*) >90 mL/min   GFR calc Af Amer 20 (*) >90 mL/min    Intake/Output Summary (Last 24 hours) at 06/29/13 0831 Last data filed at 06/29/13 0654  Gross per 24 hour  Intake    360 ml    Output    700 ml  Net   -340 ml     ASSESSMENT AND PLAN:  ATRIAL FIB:  NSR.  Beta blocker stopped and Cardizem consolidated.  Continue amiodarone.   ACUTE ON CHRONIC DIASTOLIC HF:  Creat is stable.  Continue current home dose of oral Lasix.    CKD:   Creat is stable.   We will follow as needed.   Minus Breeding 06/29/2013 8:31 AM

## 2013-06-29 NOTE — Evaluation (Signed)
Physical Therapy Evaluation Patient Details Name: Leah Harper MRN: [id number] DOB: 02/20/1920 Today's Date: 06/29/2013 Time: 3086-5784 PT Time Calculation (min): 25 min  PT Assessment / Plan / Recommendation History of Present Illness  Pt is a 80 y.o. woman   With a hx of afib, hypothyroid, hld, dementia, CHF, and GERD who presented to the ED with complaints of sob that started on the day of admit. In the ED, the patient was noted to have increased O2 requirements with afib in RVR. Also noted to have pulm edema. Cardiology was consulted and the patient was started on a cardizem gtt and lasix. Daughter present to provide information regarding home setup and PLOF.   Clinical Impression  Pt adm due to the above. Presents with decreased independence with functional mobility secondary to cognitive deficits, decreased balance and decreased strength. Pt to benefit from skilled acute PT to address deficits listed below and improve/maintain functional mobility. Pt will have 24/7 (A) at home from [facility] aides and husband. Will recommend HHPT to address functional mobility within home.     PT Assessment  Patient needs continued PT services    Follow Up Recommendations  Home health PT;Supervision/Assistance - 24 hour    Does the patient have the potential to tolerate intense rehabilitation      Barriers to Discharge        Equipment Recommendations  None recommended by PT    Recommendations for Other Services     Frequency Min 2X/week    Precautions / Restrictions Precautions Precautions: Fall Precaution Comments: per daughter denies any recent falls; has had falls in past Restrictions Weight Bearing Restrictions: No   Pertinent Vitals/Pain VSS      Mobility  Bed Mobility Bed Mobility: Not assessed Transfers Transfers: Sit to Stand;Stand to Sit Sit to Stand: 4: Min guard;From chair/3-in-1;With armrests Stand to Sit: 4: Min guard;To chair/3-in-1;With armrests Details for Transfer  Assistance: min guard to steady and for safety; cues for hand placement and sequencing with RW  Ambulation/Gait Ambulation/Gait Assistance: 4: Min assist Ambulation Distance (Feet): 80 Feet Assistive device: Rolling walker Ambulation/Gait Assistance Details: (A) to manage RW; pt with difficulty managing RW; tends to veer to Rt when ambulating; max multimodal cues for hand placement and sequencing; pt looking down at ground during ambulation; short shuffled gt  Gait Pattern: Decreased stride length;Wide base of support;Shuffle;Trunk flexed;Decreased trunk rotation Gait velocity: decreased  Stairs: No Wheelchair Mobility Wheelchair Mobility: No         PT Diagnosis: Abnormality of gait;Generalized weakness  PT Problem List: Decreased strength;Decreased activity tolerance;Decreased balance;Decreased mobility;Decreased cognition;Decreased knowledge of use of DME;Decreased safety awareness PT Treatment Interventions: DME instruction;Gait training;Functional mobility training;Therapeutic activities;Therapeutic exercise;Balance training;Neuromuscular re-education;Patient/family education     PT Goals(Current goals can be found in the care plan section) Acute Rehab PT Goals Patient Stated Goal: per daughter; to go home and get into her routine  PT Goal Formulation: With patient/family Time For Goal Achievement: 07/06/13 Potential to Achieve Goals: Fair  Visit Information  Last PT Received On: 06/29/13 Assistance Needed: +1 History of Present Illness: Pt is a 80 y.o. woman adm due to        Prior [place] expects to be discharged to:: Private residence Living Arrangements: Spouse/significant other Available Help at Discharge: Family;Personal care attendant;Available 24 hours/day Type of Home: House Home Access: Stairs to enter [employer] of Steps: 3 Entrance Stairs-Rails: Right;Left;Can reach both Home Layout: Two level;Able to live on main  level with  bedroom/bathroom Home Equipment: Walker - 2 wheels;Cane - single point;Bedside commode;Shower seat Additional Comments: has home health aide 9am - 7pm; possibly 7pm to midnight; husband is with her 24/7  Prior Function Level of Independence: Needs assistance Gait / Transfers Assistance Needed: has handheld (A) within house; ambulates with RW in community or is in wheelchair  ADL's / Homemaking Assistance Needed: aide is responsible  Comments: daughter present for PLOF and home setup; per daughter pt could feed herself PTA  Communication Communication: HOH Dominant Hand: Right    Cognition  Cognition Arousal/Alertness: Awake/alert Behavior During Therapy: WFL for tasks assessed/performed Overall Cognitive Status: History of cognitive impairments - at baseline    Extremity/Trunk Assessment Upper Extremity Assessment Upper Extremity Assessment: Generalized weakness Lower Extremity Assessment Lower Extremity Assessment: Generalized weakness Cervical / Trunk Assessment Cervical / Trunk Assessment: Kyphotic   Balance Balance Balance Assessed: Yes Static Standing Balance Static Standing - Balance Support: Bilateral upper extremity supported;During functional activity Static Standing - Level of Assistance: 4: Min assist High Level Balance High Level Balance Activites: Direction changes High Level Balance Comments: (A) to manage RW with directional changes   End of Session PT - End of Session Equipment Utilized During Treatment: Gait belt Activity Tolerance: Patient tolerated treatment well Patient left: in chair;with call bell/phone within reach;with family/visitor present;Other (comment);with nursing/sitter in room (MD in room )  [place], [place], [place] 06/29/2013, 9:56 AM

## 2013-06-29 NOTE — Progress Notes (Signed)
Pts family requesting change of Cardizem rx due to pts inability to swallow capsules, Rx changed by Dr Sloan Leiter. Prescriptions given to and explained to caregiver. Pt d/c'd to home with family

## 2013-06-30 ENCOUNTER — Telehealth: Payer: Self-pay | Admitting: Family

## 2013-06-30 NOTE — Telephone Encounter (Signed)
Spoke to husband and scheduled appointment for 07/07/13

## 2013-06-30 NOTE — Telephone Encounter (Signed)
Please call husband to arrange a 1 week hospital follow up.

## 2013-07-02 ENCOUNTER — Telehealth: Payer: Self-pay | Admitting: *Deleted

## 2013-07-02 DIAGNOSIS — K5900 Constipation, unspecified: Secondary | ICD-10-CM

## 2013-07-02 DIAGNOSIS — R300 Dysuria: Secondary | ICD-10-CM

## 2013-07-02 DIAGNOSIS — K625 Hemorrhage of anus and rectum: Secondary | ICD-10-CM

## 2013-07-02 DIAGNOSIS — N760 Acute vaginitis: Secondary | ICD-10-CM

## 2013-07-02 DIAGNOSIS — R109 Unspecified abdominal pain: Secondary | ICD-10-CM

## 2013-07-02 MED ORDER — DOCUSATE SODIUM 100 MG PO CAPS: 100.0000 mg | ORAL_CAPSULE | Freq: Every day | ORAL | Status: AC

## 2013-07-02 NOTE — Telephone Encounter (Signed)
Received request from pt's husband for Colace. Rx sent, spouse notified.

## 2013-07-04 ENCOUNTER — Telehealth (INDEPENDENT_AMBULATORY_CARE_PROVIDER_SITE_OTHER): Payer: Medicare Other | Admitting: *Deleted

## 2013-07-04 DIAGNOSIS — N390 Urinary tract infection, site not specified: Secondary | ICD-10-CM

## 2013-07-04 LAB — POCT URINALYSIS DIPSTICK
Bilirubin, UA: NEGATIVE
Glucose, UA: NEGATIVE
Ketones, UA: NEGATIVE
NITRITE UA: NEGATIVE
PH UA: 6
Protein, UA: 100
SPEC GRAV UA: 1.01
UROBILINOGEN UA: 0.2

## 2013-07-04 MED ORDER — CEPHALEXIN 500 MG PO CAPS: 500.0000 mg | ORAL_CAPSULE | Freq: Two times a day (BID) | ORAL | Status: DC

## 2013-07-04 NOTE — Telephone Encounter (Signed)
Pt's husband brought urine specimen to the office today. Thinks pt may have another UTI due to increased altered mental status and decreased appetite.  Pt has appt with [place] on 07/07/13.  Urine sent for culture.  Awaiting result.  Please advise re: urinalysis.

## 2013-07-04 NOTE — Telephone Encounter (Signed)
Rx sent for keflex for her to start while we wait for culture based on symptoms and UA findings. Please keep upcoming apt.

## 2013-07-04 NOTE — Telephone Encounter (Signed)
Notified pt's caregiver, Sonia Baller and she will notify pt's husband.

## 2013-07-06 LAB — URINE CULTURE: Colony Count: >=100000

## 2013-07-07 ENCOUNTER — Encounter: Payer: Self-pay | Admitting: Family

## 2013-07-07 ENCOUNTER — Ambulatory Visit (INDEPENDENT_AMBULATORY_CARE_PROVIDER_SITE_OTHER): Payer: Medicare Other | Admitting: Family

## 2013-07-07 VITALS — BP 118/64 | HR 48 | Temp 97.7°F | Resp 18 | Ht 64.0 in | Wt 108.0 lb

## 2013-07-07 DIAGNOSIS — N390 Urinary tract infection, site not specified: Secondary | ICD-10-CM

## 2013-07-07 DIAGNOSIS — I509 Heart failure, unspecified: Secondary | ICD-10-CM

## 2013-07-07 DIAGNOSIS — N19 Unspecified kidney failure: Secondary | ICD-10-CM

## 2013-07-07 DIAGNOSIS — N289 Disorder of kidney and ureter, unspecified: Secondary | ICD-10-CM

## 2013-07-07 DIAGNOSIS — R001 Bradycardia, unspecified: Secondary | ICD-10-CM

## 2013-07-07 DIAGNOSIS — N184 Chronic kidney disease, stage 4 (severe): Secondary | ICD-10-CM

## 2013-07-07 DIAGNOSIS — I4891 Unspecified atrial fibrillation: Secondary | ICD-10-CM

## 2013-07-07 DIAGNOSIS — I498 Other specified cardiac arrhythmias: Secondary | ICD-10-CM

## 2013-07-07 DIAGNOSIS — I5033 Acute on chronic diastolic (congestive) heart failure: Secondary | ICD-10-CM

## 2013-07-07 LAB — BASIC METABOLIC PANEL
BUN: 46 mg/dL — ABNORMAL HIGH (ref 6–23)
CHLORIDE: 96 meq/L (ref 96–112)
CO2: 24 meq/L (ref 19–32)
CREATININE: 2.39 mg/dL — AB (ref 0.50–1.10)
Calcium: 8.7 mg/dL (ref 8.4–10.5)
Glucose, Bld: 100 mg/dL — ABNORMAL HIGH (ref 70–99)
POTASSIUM: 3.9 meq/L (ref 3.5–5.3)
Sodium: 133 mEq/L — ABNORMAL LOW (ref 135–145)

## 2013-07-07 NOTE — Progress Notes (Signed)
Subjective:    Patient ID: Leah Harper, female    DOB: 1919/11/14, 80 y.o.   MRN: [id number]  HPI  Ms. Martinson is a 80 yr old female with dementia who presents today for hospital follow up. Hospital records are reviewed. Pt was admitted 1/1-06/29/13 due to AF with RVR, Her presenting symptom was shortness of breath. She was noted to be in acute on chronic heart failure.  She was started on cardizem and lasix in the ED and admitted. Cardiology was consulted. She was placed on amiodarone and converted to NSR.  She was ultimately started on oral amio 200 bid at time of discharge. She was noted to be in acute on chronic renal failure. She was continued on lasix 40 mg at time of discharge.  Wt Readings from Last 3 Encounters:  07/07/13 108 lb (48.988 kg)  06/29/13 103 lb 9.9 oz (47 kg)  06/17/13 109 lb (49.442 kg)    Pt's husband brought [place] a urine sample on Friday because he thought that the pt was weak and more confused than usual.  UA was suggestive of UTI and Keflex was started based on urine dip. Urine culture grew cephalosporin sensitive e. Coli.   Husband reports that they called Home hospice to the home for "informational purposes only."    Review of Systems Past Medical History  Diagnosis Date  . Hyperlipidemia   . Hypertension   . AF (atrial fibrillation)   . Hyperthyroidism     remotely in 1941  . Normocytic anemia     2002  . S/P TAH-BSO (total abdominal hysterectomy and bilateral salpingo-oophorectomy)   . Allergy   . Arrhythmia   . Anxiety   . Dementia   . CHF (congestive heart failure)   . Kidney infection     history of   . GERD (gastroesophageal reflux disease)   . Rectal bleeding 05/11/2013    History   Social History  . Marital Status: Married    Spouse Name: Gwyndolyn Saxon    Number of Children: 3  . Years of Education: N/A   Occupational History  . retired    Social History Main Topics  . Smoking status: Former Smoker    Quit date: 07/17/1963  .  Smokeless tobacco: Never Used  . Alcohol Use: No  . Drug Use: No  . Sexual Activity: Not on file   Other Topics Concern  . Not on file   Social History Narrative   Retired   Married -3 children    Tobacco abuse-no   Alcohol use-no     Regular exercise-no    Past Surgical History  Procedure Laterality Date  . Tonsillectomy    . Total abdominal hysterectomy w/ bilateral salpingoophorectomy  1986  . Bilateral salpingoophorectomy    . Bladder suspension      bladder tack with TAHBSO    Family History  Problem Relation Age of Onset  . Heart disease Father   . Hyperlipidemia Father   . Heart attack Father   . Heart disease Brother     heart attack  . Heart disease Brother     Allergies  Allergen Reactions  . Exelon [Rivastigmine]     Patch burns her skin and is unable to tolerate it.  . Statins     Myalgia   . Tylenol [Acetaminophen]     Facial rash    Current Outpatient Prescriptions on File Prior to Visit  Medication Sig Dispense Refill  . ALPRAZolam (XANAX) 0.25 MG  tablet Take 1 tablet (0.25 mg total) by mouth 2 (two) times daily as needed. For anxiety  60 tablet  0  . amiodarone (PACERONE) 200 MG tablet Take 1 tablet (200 mg total) by mouth daily.  30 tablet  0  . apixaban (ELIQUIS) 2.5 MG TABS tablet Take 1 tablet (2.5 mg total) by mouth 2 (two) times daily.  60 tablet  11  . cephALEXin (KEFLEX) 500 MG capsule Take 1 capsule (500 mg total) by mouth 2 (two) times daily.  10 capsule  0  . diltiazem (CARDIZEM) 60 MG tablet Take 1 tablet (60 mg total) by mouth 2 (two) times daily.  60 tablet  0  . docusate sodium (COLACE) 100 MG capsule Take 1 capsule (100 mg total) by mouth daily.  30 capsule  2  . ENSURE (ENSURE) Take 237 mLs by mouth 2 (two) times daily between meals.       Marland Kitchen ezetimibe (ZETIA) 10 MG tablet Take 1 tablet (10 mg total) by mouth daily.  90 tablet  1  . furosemide (LASIX) 20 MG tablet Take 2 tablets (40 mg total) by mouth daily.  60 tablet  3  .  megestrol (MEGACE) 400 MG/10ML suspension Take 400 mg by mouth daily.      Marland Kitchen omeprazole (PRILOSEC) 20 MG capsule Take 20 mg by mouth daily as needed (acid reflux).       . traMADol (ULTRAM) 50 MG tablet Take 50 mg by mouth every 8 (eight) hours as needed for moderate pain.       No current facility-administered medications on file prior to visit.    BP 118/64  Pulse 48  Temp(Src) 97.7 F (36.5 C) (Oral)  Resp 18  Ht 5\' 4"  (1.626 m)  Wt 108 lb (48.988 kg)  BMI 18.53 kg/m2  SpO2 97%       Objective:   Physical Exam  Constitutional: She appears well-developed and well-nourished. No distress.  HENT:  Head: Normocephalic and atraumatic.  Cardiovascular: Normal rate and regular rhythm.   No murmur heard. Pulmonary/Chest: Effort normal. No respiratory distress. She has decreased breath sounds in the right lower field and the left lower field. She has no wheezes. She has no rales. She exhibits no tenderness.  Neurological: She is alert.  Hard of hearing Follows commands  Skin: Skin is warm and dry.  Psychiatric: She has a normal mood and affect. Her behavior is normal. Thought content normal.          Assessment & Plan:  Long discussion today with son, husband re: possibility of hospice referral.  Advised that her dx (CHF, dementia, and Renal failure) would qualify her.  Advised that bringing in hospice would allow them some additional resources but would not mean that we are withdrawing care.  Husband wishes to have DNR on file, filled the [facility] DNR form today.  Gave DNR form to pt's husband.  He will think about hospice referral and let me know if he decides he wishes to proceed.

## 2013-07-07 NOTE — Assessment & Plan Note (Signed)
Obtain follow up bmet.  

## 2013-07-07 NOTE — Assessment & Plan Note (Signed)
Variable rate today but clinically stable. Continue current meds.  EKG notes AF/flutter.  EKG forwarded to Dr. Johnsie Cancel for review.

## 2013-07-07 NOTE — Progress Notes (Signed)
Pre visit review using our clinic review tool, if applicable. No additional management support is needed unless otherwise documented below in the visit note. 

## 2013-07-07 NOTE — Assessment & Plan Note (Signed)
Clinically improving on keflex.  Complete rx.

## 2013-07-07 NOTE — Patient Instructions (Signed)
Please go to lab prior to leaving. Complete antibiotics. Follow up in 1 month.

## 2013-07-07 NOTE — Assessment & Plan Note (Signed)
Euvolemic.  Continue lasix. Obtain bmet.

## 2013-07-08 ENCOUNTER — Encounter: Payer: Self-pay | Admitting: Family

## 2013-07-16 ENCOUNTER — Ambulatory Visit (INDEPENDENT_AMBULATORY_CARE_PROVIDER_SITE_OTHER): Payer: Medicare Other | Admitting: Cardiovascular Disease

## 2013-07-16 ENCOUNTER — Encounter: Payer: Self-pay | Admitting: Cardiovascular Disease

## 2013-07-16 VITALS — BP 118/64 | HR 52 | Ht 63.0 in | Wt 106.0 lb

## 2013-07-16 DIAGNOSIS — I509 Heart failure, unspecified: Secondary | ICD-10-CM

## 2013-07-16 DIAGNOSIS — I10 Essential (primary) hypertension: Secondary | ICD-10-CM

## 2013-07-16 DIAGNOSIS — I4891 Unspecified atrial fibrillation: Secondary | ICD-10-CM

## 2013-07-16 DIAGNOSIS — F0390 Unspecified dementia without behavioral disturbance: Secondary | ICD-10-CM

## 2013-07-16 NOTE — Progress Notes (Signed)
Patient ID: Leah Harper, female   DOB: 1920/06/17, 80 y.o.   MRN: [id number] Leah Harper is a 80 y.o. female with AF, HTN, advancing dementia and CKD Seen in hospital 1/15 for afib and CHF   She is accompanied by family. She had a negative nuclear study in 2003. Echo August 2014 - inferolateral HK, mild LVH, EF 50%, MAC, mild RAE. Last seen by me  October 2014. She also has history of tachy-brady and her beta blocker has been discontinued. She has been maintained on rate control with short acting diltiazem and anticoagulation with Eliquis. Converted in hospital on amiodarone and improved  Recent UTI Rx with cephalasporin  Echo 8/22 wirth AV sclerosis and EF 50%    She is clearly a hospice candidate Discussed no heroic care and would Rx at home Dementia is very advanced Would not monitor oxygen unless tachypnic and weight / pulse only 2x week     ROS: Denies fever, malais, weight loss, blurry vision, decreased visual acuity, cough, sputum, SOB, hemoptysis, pleuritic pain, palpitaitons, heartburn, abdominal pain, melena, lower extremity edema, claudication, or rash.  All other systems reviewed and negative  General: Affect appropriate Frail demented female Oblivious to environment  HEENT: normal Neck supple with no adenopathy JVP normal no bruits no thyromegaly Lungs clear with no wheezing and good diaphragmatic motion Heart:  S1/S2 sEM murmur, no rub, gallop or click PMI normal Abdomen: benighn, BS positve, no tenderness, no AAA no bruit.  No HSM or HJR Distal pulses intact with no bruits No edema Neuro non-focal Skin warm and dry No muscular weakness   Current Outpatient Prescriptions  Medication Sig Dispense Refill  . ALPRAZolam (XANAX) 0.25 MG tablet Take 1 tablet (0.25 mg total) by mouth 2 (two) times daily as needed. For anxiety  60 tablet  0  . amiodarone (PACERONE) 200 MG tablet Take 1 tablet (200 mg total) by mouth daily.  30 tablet  0  . apixaban (ELIQUIS) 2.5 MG TABS  tablet Take 1 tablet (2.5 mg total) by mouth 2 (two) times daily.  60 tablet  11  . diltiazem (CARDIZEM) 60 MG tablet Take 1 tablet (60 mg total) by mouth 2 (two) times daily.  60 tablet  0  . docusate sodium (COLACE) 100 MG capsule Take 1 capsule (100 mg total) by mouth daily.  30 capsule  2  . ENSURE (ENSURE) Take 237 mLs by mouth 2 (two) times daily between meals.       Marland Kitchen ezetimibe (ZETIA) 10 MG tablet Take 1 tablet (10 mg total) by mouth daily.  90 tablet  1  . furosemide (LASIX) 20 MG tablet Take 2 tablets (40 mg total) by mouth daily.  60 tablet  3  . megestrol (MEGACE) 400 MG/10ML suspension Take 400 mg by mouth daily.      Marland Kitchen omeprazole (PRILOSEC) 20 MG capsule Take 20 mg by mouth daily as needed (acid reflux).       . traMADol (ULTRAM) 50 MG tablet Take 50 mg by mouth every 8 (eight) hours as needed for moderate pain.      . cephALEXin (KEFLEX) 500 MG capsule Take 1 capsule (500 mg total) by mouth 2 (two) times daily.  10 capsule  0   No current facility-administered medications for this visit.    Allergies  Exelon; Statins; and Tylenol  Electrocardiogram: afib LBBB rate 98    Assessment and Plan

## 2013-07-16 NOTE — Patient Instructions (Signed)
Your physician recommends that you schedule a follow-up appointment in: AS NEEDED  Your physician recommends that you continue on your current medications as directed. Please refer to the Current Medication list given to you today.  

## 2013-07-16 NOTE — Assessment & Plan Note (Signed)
In NSR in office continue amiodarone and cardizem  Stable

## 2013-07-16 NOTE — Assessment & Plan Note (Signed)
EF 81% diastolic component improved May need to back off on lasix to daily depending on Cr  Can be followed by primary and hospice

## 2013-07-16 NOTE — Assessment & Plan Note (Signed)
Well controlled.  Continue current medications and low sodium Dash type diet.    

## 2013-07-16 NOTE — Assessment & Plan Note (Signed)
Should be DNR/ Hospice care answered many questions with husband of 34 years son and caretaker  They agree needs f/u UA post UTI Rx

## 2013-07-17 ENCOUNTER — Telehealth: Payer: Self-pay | Admitting: Family

## 2013-07-17 DIAGNOSIS — R300 Dysuria: Secondary | ICD-10-CM

## 2013-07-17 DIAGNOSIS — N390 Urinary tract infection, site not specified: Secondary | ICD-10-CM

## 2013-07-17 NOTE — Telephone Encounter (Signed)
Referral to Hospice will need to come from [facility] who is the patient's PCP.  Giving the patient's circumstances I am ok with him picking up a cup and hat to bring in a urine specimen.  If patient has a fever or N/V, they need to schedule an appointment to be seen.

## 2013-07-17 NOTE — Telephone Encounter (Signed)
Requesting referral to hospice(per Dr. Johnsie Cancel) and UA, possible kidney infection

## 2013-07-18 ENCOUNTER — Other Ambulatory Visit: Payer: Self-pay | Admitting: Physician Assistant

## 2013-07-18 ENCOUNTER — Other Ambulatory Visit: Payer: Self-pay | Admitting: Family

## 2013-07-18 DIAGNOSIS — R300 Dysuria: Secondary | ICD-10-CM

## 2013-07-18 NOTE — Telephone Encounter (Signed)
Patient dropped off urine, waiting for results.

## 2013-07-18 NOTE — Telephone Encounter (Signed)
Please advise 

## 2013-07-18 NOTE — Telephone Encounter (Signed)
OK to bring urine in for dip and culture.  Follow up in office on Monday as scheduled.

## 2013-07-19 LAB — URINALYSIS, ROUTINE W REFLEX MICROSCOPIC
BILIRUBIN URINE: NEGATIVE
Glucose, UA: NEGATIVE mg/dL
KETONES UR: NEGATIVE mg/dL
Nitrite: NEGATIVE
PROTEIN: 30 mg/dL — AB
Specific Gravity, Urine: 1.016 (ref 1.005–1.030)
UROBILINOGEN UA: 0.2 mg/dL (ref 0.0–1.0)
pH: 6.5 (ref 5.0–8.0)

## 2013-07-19 LAB — URINALYSIS, MICROSCOPIC ONLY
CASTS: NONE SEEN
CRYSTALS: NONE SEEN
SQUAMOUS EPITHELIAL / LPF: NONE SEEN

## 2013-07-20 ENCOUNTER — Other Ambulatory Visit: Payer: Self-pay | Admitting: Internal Medicine

## 2013-07-20 MED ORDER — AMOXICILLIN-POT CLAVULANATE 500-125 MG PO TABS: 1.0000 | ORAL_TABLET | Freq: Three times a day (TID) | ORAL | Status: DC

## 2013-07-20 NOTE — Telephone Encounter (Signed)
Results reveal UTI with E. Coli as causative organism.  Will empirically treat with Augmentin until sensitivities return due to contraindications to other medications. Rx Augmentin BID x 3 days. Patient has follow-up with Melissa on 07/21/13.

## 2013-07-21 ENCOUNTER — Encounter: Payer: Self-pay | Admitting: Family

## 2013-07-21 ENCOUNTER — Ambulatory Visit (INDEPENDENT_AMBULATORY_CARE_PROVIDER_SITE_OTHER): Payer: Medicare Other | Admitting: Family

## 2013-07-21 VITALS — BP 100/68 | HR 56 | Temp 97.5°F | Resp 12 | Wt 106.0 lb

## 2013-07-21 DIAGNOSIS — F0390 Unspecified dementia without behavioral disturbance: Secondary | ICD-10-CM

## 2013-07-21 DIAGNOSIS — N390 Urinary tract infection, site not specified: Secondary | ICD-10-CM

## 2013-07-21 LAB — URINE CULTURE: Colony Count: >=100000

## 2013-07-21 MED ORDER — CEPHALEXIN 125 MG/5ML PO SUSR: ORAL | Status: DC

## 2013-07-21 NOTE — Telephone Encounter (Signed)
Patient spouse informed, understood & agreed; also given Ok per provider to crush tablet for patient intake & will keep appt w/Melissa today at 1:30p/SLS

## 2013-07-21 NOTE — Assessment & Plan Note (Signed)
Pt with progressive dementia, chronic diastolic heart failure, Chronic renal insufficiency. At this time family wishes to continue her medical care but would like referral to hospice.  I think their decision is appropriate at this stage.  Will place referral to home hospice.

## 2013-07-21 NOTE — Progress Notes (Signed)
   Subjective:    Patient ID: Leah Harper, female    DOB: Oct 18, 1919, 80 y.o.   MRN: [id number]  HPI  Ms. Spranger is a 80 yr old female with fairly advanced dementia, who presents today for follow up with her family. She is accompanied by her husband, paid caregiver and son. They have noted progressive decline and would like to discuss referral to home hospice.   Wt Readings from Last 3 Encounters:  07/21/13 106 lb (48.081 kg)  07/16/13 106 lb (48.081 kg)  07/07/13 108 lb (48.988 kg)   They report that they continue megace, pt's appetite is good.  UTI- pt's husband brought a urine sample on Friday which grew E. Coli. Rx was sent for augmentin by Elyn Aquas PA-c.  She started augmentin today.    Review of Systems     Objective:   Physical Exam  Constitutional: She appears well-developed and well-nourished. No distress.  Cardiovascular: Normal rate.  An irregularly irregular rhythm present.  No murmur heard. Pulmonary/Chest: Effort normal and breath sounds normal. No respiratory distress. She has no wheezes. She has no rales. She exhibits no tenderness.  Musculoskeletal: She exhibits no edema.  Neurological: She is alert.  Confused but pleasant  Psychiatric: She has a normal mood and affect. Her behavior is normal. Thought content normal.          Assessment & Plan:  25 minutes spent with pt and her family.  >50 % of this time was spent counseling pt and family on hospice referral and their services.

## 2013-07-21 NOTE — Progress Notes (Signed)
Pre visit review using our clinic review tool, if applicable. No additional management support is needed unless otherwise documented below in the visit note. 

## 2013-07-21 NOTE — Assessment & Plan Note (Signed)
Pt with recurrent uti. Will have her complete 2 day rx of augmentin, then 1 day of keflex 250mg , then drop down to keflex 125mg  once daily for uti prophylaxis.  Discussed with husband the increased risk of c. Diff infection with prolonged abx use, but given the recurrent nature and severity of her UTI's, then I think that the benefit outweighs the risk and the husband is in agreement.

## 2013-07-21 NOTE — Patient Instructions (Signed)
Please weight pt weekly and call us with your reading. Complete augmentin prescription, then take 14ml twice daily for 2 doses of the cephelexin (Keflex) on third day.  On fourth day start Cephelexin 71ml once daily to prevent recurrent urinary tract infection. You will be contacted about your hospice referral.  Follow up in 2 months, sooner if problems/concerns.

## 2013-07-24 ENCOUNTER — Telehealth: Payer: Self-pay | Admitting: Family

## 2013-07-24 NOTE — Telephone Encounter (Signed)
refill-furosemide 20mg  tab. Qty 90 day supply

## 2013-07-25 MED ORDER — FUROSEMIDE 20 MG PO TABS: 40.0000 mg | ORAL_TABLET | Freq: Every day | ORAL | Status: AC

## 2013-07-25 NOTE — Telephone Encounter (Signed)
furosemide (LASIX) 20 MG tablet 60 tablet 3 06/17/2013 Sig - Route: Take 2 tablets (40 mg total) by mouth daily. - Oral E-Prescribing Status:  90-day supply requested; new Rx to pharmacy/SLS

## 2013-07-28 ENCOUNTER — Other Ambulatory Visit: Payer: Self-pay | Admitting: Internal Medicine

## 2013-07-30 ENCOUNTER — Telehealth: Payer: Self-pay | Admitting: *Deleted

## 2013-07-30 MED ORDER — DILTIAZEM HCL 60 MG PO TABS: 60.0000 mg | ORAL_TABLET | Freq: Two times a day (BID) | ORAL | Status: AC

## 2013-07-30 MED ORDER — AMIODARONE HCL 200 MG PO TABS: 200.0000 mg | ORAL_TABLET | Freq: Every day | ORAL | Status: AC

## 2013-07-30 NOTE — Telephone Encounter (Signed)
Received call from pharmacy requesting refills on pt's amiodarone and diltiazem. Gave verbal for 30 days supply x 2 refills.

## 2013-08-22 ENCOUNTER — Encounter: Payer: Self-pay | Admitting: Family

## 2013-08-22 ENCOUNTER — Telehealth: Payer: Self-pay | Admitting: *Deleted

## 2013-08-22 ENCOUNTER — Other Ambulatory Visit: Payer: Self-pay | Admitting: Family

## 2013-08-22 NOTE — Telephone Encounter (Signed)
Attempted to reach pt's spouse, spoke with caregiver. She is unclear why we received refill requesting for cephalexin. Will try back later.

## 2013-08-22 NOTE — Telephone Encounter (Signed)
See letter.

## 2013-08-22 NOTE — Telephone Encounter (Signed)
Received letter from [facility] stating they received the Power of Attorney Request but need a letter from pt's PCP stating pt is unable to make health decisions. Pt's spouse is also requesting copy of this letter.  Please advise.

## 2013-08-25 NOTE — Telephone Encounter (Signed)
Letters placed at front desk for pt's spouse to pick up and detailed message left on spouse's voicemail [phone]).

## 2013-08-25 NOTE — Telephone Encounter (Signed)
[facility] Triage Call Report Triage Record Num: [id number] Operator: Erline Hau Patient Name: Emmajean Ratledge Call Date & Time: 08/24/2013 12:20:17PM Patient Phone: [phone] PCP: Patient Gender: Female PCP Fax : Patient DOB: 1919/12/03 Practice Name: [facility] Reason for Call: Caller: Jenny Reichmann; PCP: Inda Castle, Melissa (Adults only); CB#: [phone]; Call regarding Medication Issue; Medication(s): Cephalexin suspension ; Onset: 08/24/13 Spoke with pharmacist and request submitted for refill of Cephalexin suspension that is ordered for UTI prevention. Patient is with hospice care. Spoke to family and obtained number for Hospice nurse Vara Guardian [phone] and had the facility page the Hospice nurse for follow up to ensure that they are aware of the request and that we are not bypassing their processes and to ensure that they do not have standing orders for medication refill. Spoke with Hospice and this medication is not covered under her care due to it not being related and has to be authorized through the provider, Spoke with Dr. Loura Pardon and ok given to authorize refill of Cephalexin 24ml once daily may fill 271ml however if unable to afford due to cost may dispense a smaller quantity if needed. Pharmacy notified. Protocol(s) Used: Office Note Recommended Outcome per Protocol: Information Noted and Sent to Office Reason for Outcome: Caller information to office Care Advice:

## 2013-08-27 NOTE — Telephone Encounter (Signed)
Melissa, I have received no response from pt's spouse.  Is pt supposed to be taking this for UTI prevention?

## 2013-08-28 ENCOUNTER — Telehealth: Payer: Self-pay | Admitting: Family

## 2013-08-28 MED ORDER — ALPRAZOLAM 0.25 MG PO TABS: 0.2500 mg | ORAL_TABLET | Freq: Four times a day (QID) | ORAL | Status: AC | PRN

## 2013-08-28 MED ORDER — TRAMADOL HCL 50 MG PO TABS: 50.0000 mg | ORAL_TABLET | Freq: Three times a day (TID) | ORAL | Status: AC | PRN

## 2013-08-28 NOTE — Telephone Encounter (Signed)
FYI: Spoke with Hospice nurse and she states that patient is very agitated and it is worse at night and having increased pain d/t fall; pt has had recent check for UTI and labs were normal & also pt is currently on Cephalexin Susp for maintenance of recurrent UTIs [no need to think confusion from UTI and no Dementia medication d/t palliative care only]. Per verbal order, Dr. Penni Homans, gave Hospice nurse orders: Tramadol 50 mg can be given 100 mg BID for 48 hours only & Xanax 0.25 mg can be increased to QID PRN for 7 days only/SLS

## 2013-08-28 NOTE — Telephone Encounter (Signed)
Pt fell last night, very confused, in pain. Request new orders for confusion and pain meds ASAP. Nurse will be in home for about 30 more

## 2013-08-29 NOTE — Telephone Encounter (Signed)
Refill was sent by oncall doctor previously. Per verbal from Provider can continue refills as needed.

## 2013-09-11 ENCOUNTER — Other Ambulatory Visit: Payer: Self-pay | Admitting: Family Medicine

## 2013-09-11 NOTE — Telephone Encounter (Signed)
Rout to PCP 

## 2013-09-12 MED ORDER — CEPHALEXIN 125 MG/5ML PO SUSR: 125.0000 mg | Freq: Every day | ORAL | Status: DC

## 2013-09-24 DEATH — deceased

## 2013-09-25 ENCOUNTER — Telehealth: Payer: Self-pay | Admitting: Family

## 2013-09-25 NOTE — Telephone Encounter (Signed)
Pat from [facility] called wanting Melissa or Dr. Charlett Blake to sign a death certificate. Fraser Din states that patient passed away on 10-16-13. Spoke to [place] and he said that if for some reason Dr. Charlett Blake was not here that he would sign the death certificate.

## 2013-09-25 NOTE — Telephone Encounter (Signed)
signed

## 2014-06-18 IMAGING — CR DG CHEST 2V
2 series · 2 of 2 positions shown · non-contrast
Comparison: 02/13/2013; 01/28/2013

CLINICAL DATA: Right-sided wheezing with possible volume overload

CHEST - 2 VIEW

[w chest pa]
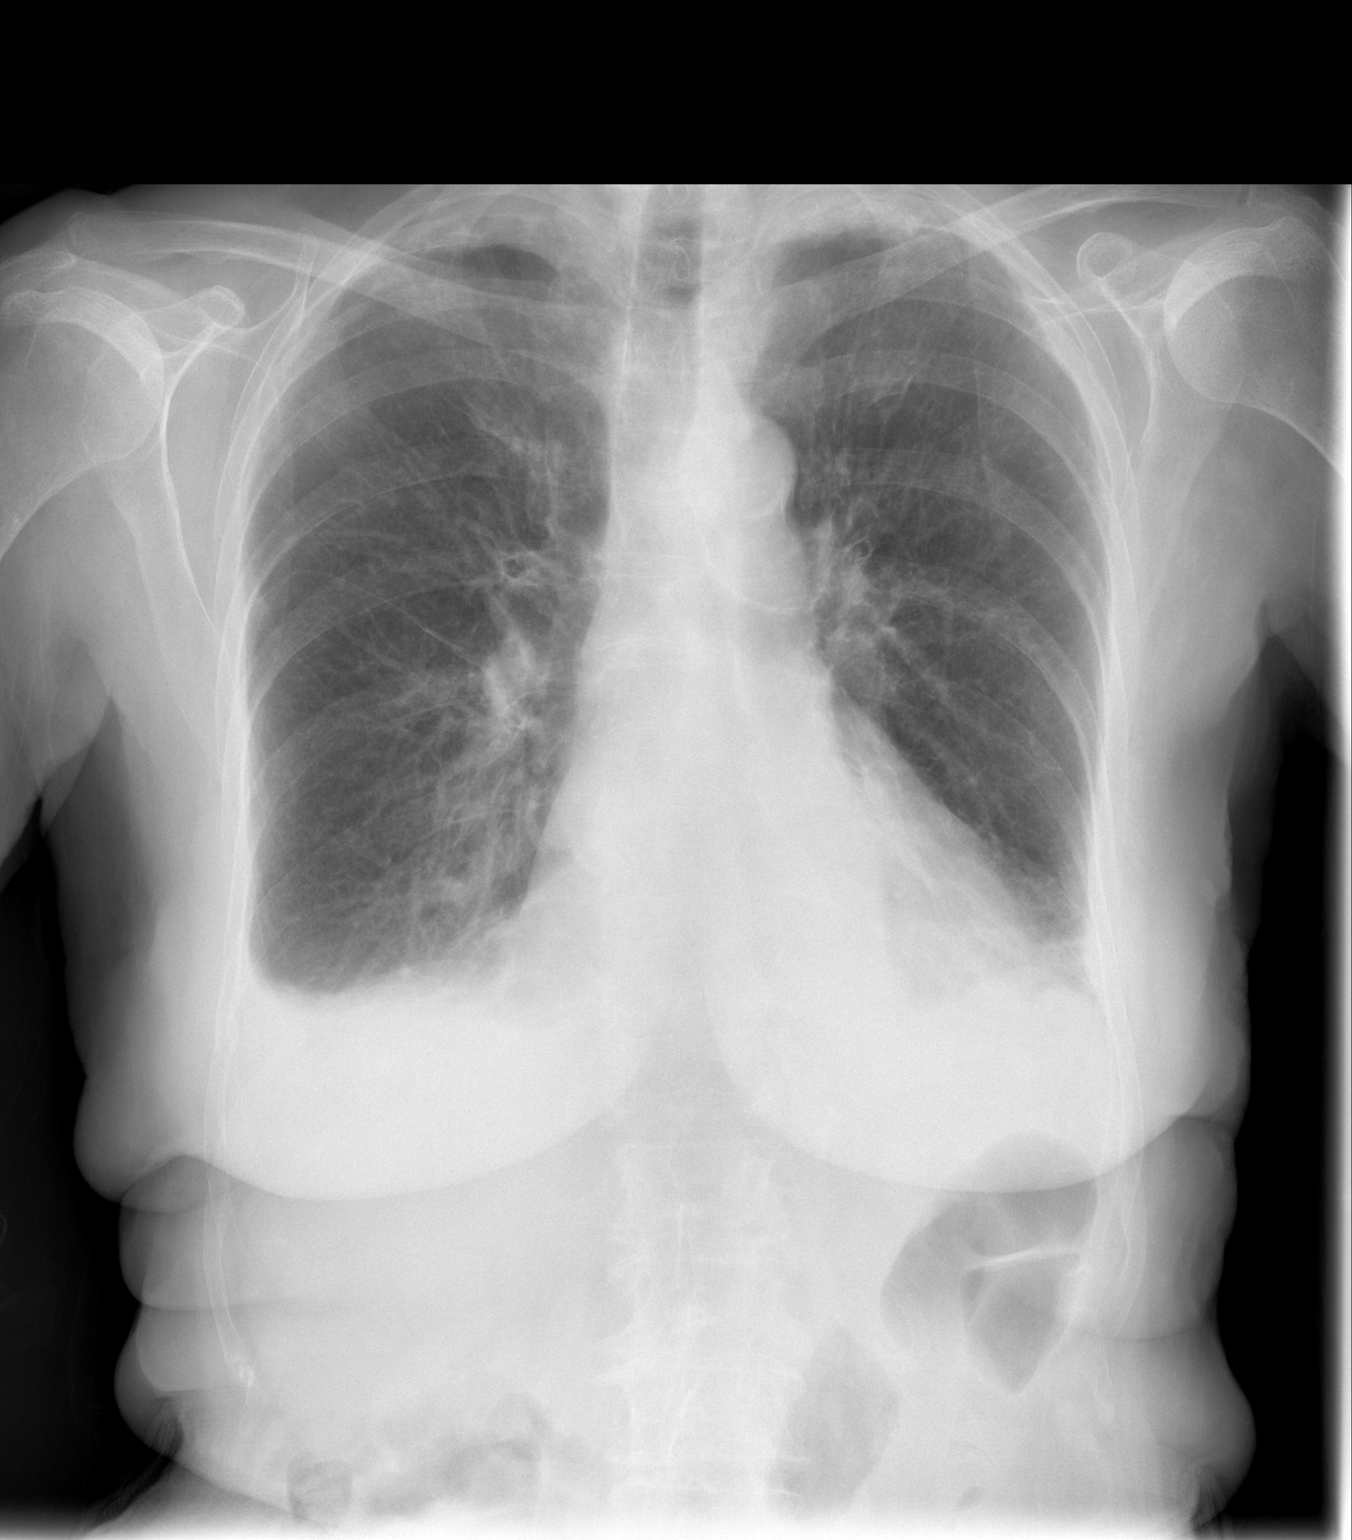

[w chest lat]
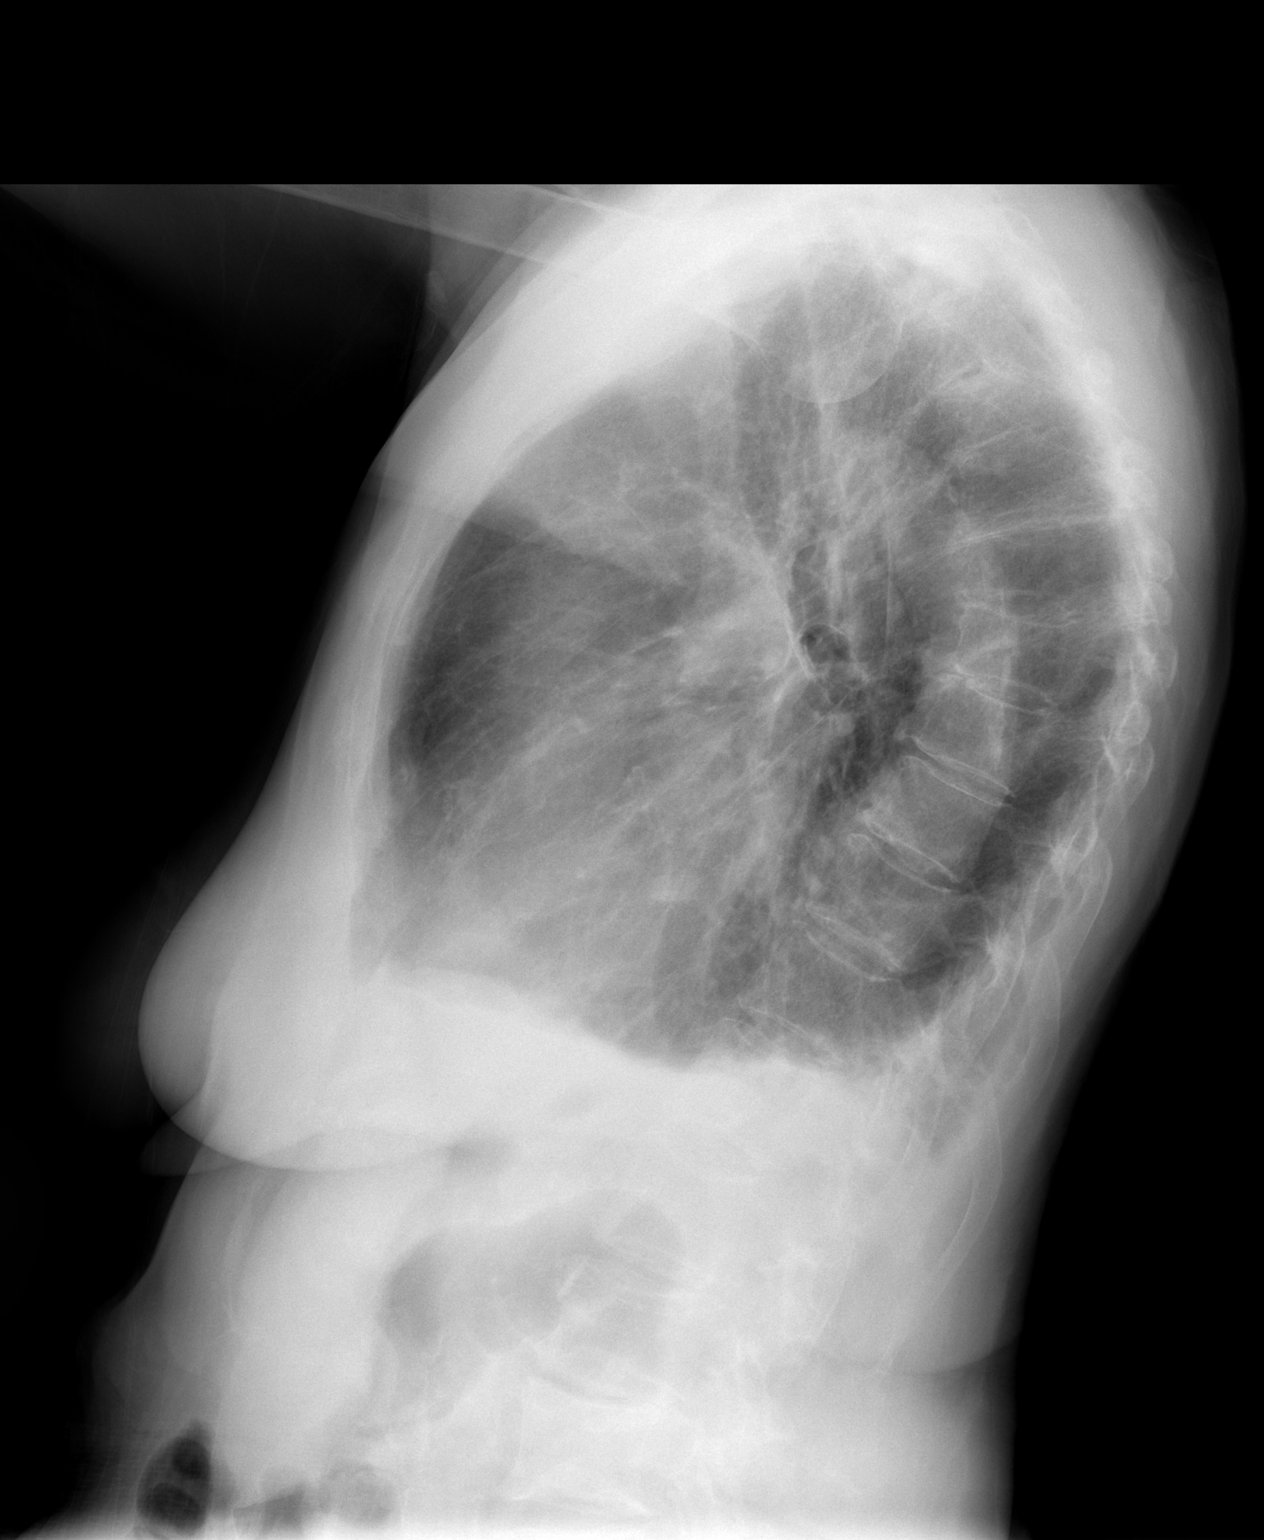

[2 of 2 positions shown; findings below may reference images not displayed]

FINDINGS: Grossly unchanged cardiac silhouette and mediastinal contours.  The
lungs appear hyperexpanded with flattening of the bilateral
hemidiaphragms.  There is mild cephalization of flow with
development of small bilateral effusions and worsening bibasilar
opacities.  Grossly unchanged grossly symmetric biapical pleural
parenchymal thickening.  No pneumothorax.  Grossly unchanged bones.
IMPRESSION: Findings most suggestive of mild pulmonary edema with small
symmetric bilateral effusions and associated bibasilar opacities
favored to represent atelectasis.
